# Patient Record
Sex: Female | Born: 1970
Health system: Southern US, Community
[De-identification: ages and names within clinical notes are randomized; demographics above are authoritative.]

## PROBLEM LIST (undated history)

## (undated) DIAGNOSIS — F419 Anxiety disorder, unspecified: Secondary | ICD-10-CM

## (undated) DIAGNOSIS — N62 Hypertrophy of breast: Secondary | ICD-10-CM

## (undated) DIAGNOSIS — E785 Hyperlipidemia, unspecified: Secondary | ICD-10-CM

## (undated) DIAGNOSIS — E039 Hypothyroidism, unspecified: Secondary | ICD-10-CM

## (undated) DIAGNOSIS — N3010 Interstitial cystitis (chronic) without hematuria: Secondary | ICD-10-CM

## (undated) DIAGNOSIS — K219 Gastro-esophageal reflux disease without esophagitis: Secondary | ICD-10-CM

## (undated) HISTORY — DX: Hyperlipidemia, unspecified: E78.5

## (undated) HISTORY — PX: BREAST BIOPSY: SHX20

---

## 2013-08-12 ENCOUNTER — Other Ambulatory Visit: Payer: Self-pay | Admitting: Internal Medicine

## 2013-08-12 MED ORDER — FLUCONAZOLE 150 MG PO TABS: ORAL_TABLET | ORAL | Status: DC

## 2013-08-18 ENCOUNTER — Encounter: Payer: Self-pay | Admitting: Physician Assistant

## 2013-08-18 ENCOUNTER — Ambulatory Visit (INDEPENDENT_AMBULATORY_CARE_PROVIDER_SITE_OTHER): Payer: BC Managed Care – PPO | Admitting: Physician Assistant

## 2013-08-18 VITALS — BP 122/80 | HR 72 | Temp 99.0°F | Resp 16 | Ht 68.0 in | Wt 170.0 lb

## 2013-08-18 DIAGNOSIS — R062 Wheezing: Secondary | ICD-10-CM

## 2013-08-18 DIAGNOSIS — E039 Hypothyroidism, unspecified: Secondary | ICD-10-CM

## 2013-08-18 DIAGNOSIS — R002 Palpitations: Secondary | ICD-10-CM

## 2013-08-18 LAB — CBC WITH DIFFERENTIAL/PLATELET
BASOS ABS: 0.1 10*3/uL (ref 0.0–0.1)
Basophils Relative: 1 % (ref 0–1)
Eosinophils Absolute: 0.2 10*3/uL (ref 0.0–0.7)
Eosinophils Relative: 3 % (ref 0–5)
HCT: 40.8 % (ref 36.0–46.0)
Hemoglobin: 14.1 g/dL (ref 12.0–15.0)
LYMPHS ABS: 2 10*3/uL (ref 0.7–4.0)
LYMPHS PCT: 32 % (ref 12–46)
MCH: 29.7 pg (ref 26.0–34.0)
MCHC: 34.6 g/dL (ref 30.0–36.0)
MCV: 86.1 fL (ref 78.0–100.0)
Monocytes Absolute: 0.5 10*3/uL (ref 0.1–1.0)
Monocytes Relative: 8 % (ref 3–12)
NEUTROS ABS: 3.5 10*3/uL (ref 1.7–7.7)
NEUTROS PCT: 56 % (ref 43–77)
Platelets: 321 10*3/uL (ref 150–400)
RBC: 4.74 MIL/uL (ref 3.87–5.11)
RDW: 12.8 % (ref 11.5–15.5)
WBC: 6.3 10*3/uL (ref 4.0–10.5)

## 2013-08-18 MED ORDER — ESOMEPRAZOLE MAGNESIUM 40 MG PO CPDR: 40.0000 mg | DELAYED_RELEASE_CAPSULE | Freq: Every day | ORAL | Status: DC

## 2013-08-18 NOTE — Progress Notes (Signed)
   Subjective:    Patient ID: Shari Payne, female    DOB: 05-12-1971, 43 y.o.   MRN: [id number]  HPI 43 y.o. female with history of anxiety and hypothyroid presents with palpitations and presyncope. She has not had her thyroid checked for over a year. She has walked/jogged with her dog, went yesterday for 2 miles (13 min miles), without any symptoms, no CP, SOB, dizziness.   She has always had some "hard" heart beats with occ skipping that she has contributed to anxiety, however over the past 6 months they have gotten worse. Worse over night and worse around 4 pm. She admits that she has been under increasing stress over work. She only admits that she feels her anxiety has not been controlled, she takes the xanax 1/2 at night. She states she sleeps 10-11 hours. Occ heart burn, takes ibuprofen often due to lower back pain which she attributes to her large breast. She has grooves bilateral shoulders.   Review of Systems  Constitutional: Negative for fever, chills, diaphoresis, activity change, appetite change, fatigue and unexpected weight change.  HENT: Negative.   Respiratory: Negative.   Cardiovascular: Positive for palpitations. Negative for chest pain and leg swelling.  Gastrointestinal: Negative.        GERD  Genitourinary: Negative.   Musculoskeletal: Positive for back pain and myalgias. Negative for arthralgias, gait problem, joint swelling, neck pain and neck stiffness.  Skin: Negative.   Neurological: Positive for syncope (presyncope).  Hematological: Negative.        Objective:   Physical Exam  Constitutional: She is oriented to person, place, and time. She appears well-developed and well-nourished.  HENT:  Head: Normocephalic and atraumatic.  Eyes: Conjunctivae are normal. Pupils are equal, round, and reactive to light.  Neck: Normal range of motion. Neck supple.  Cardiovascular: Normal rate and regular rhythm.  Exam reveals no gallop.   No murmur heard. Loud S2   Pulmonary/Chest: Effort normal. She has wheezes (RLL).  Abdominal: Soft. Bowel sounds are normal. There is tenderness (epigastric). There is no rebound and no guarding.  Musculoskeletal: Normal range of motion.  Lower back pain with changes in position  Lymphadenopathy:    She has no cervical adenopathy.  Neurological: She is alert and oriented to person, place, and time.  Skin: Skin is warm and dry.  Grooves in bilateral shoulders.        Assessment & Plan:  Palpitations- without accompaniments of CP, SOB, dizziness but with one episode of questionable presyncope- ? pericordial knock/ wheezing right lower low- get CXR EKG is normal, no ST changes Nexium samples? esophagel spasm- take samples on empty stomach, decrease NSAIDS Check labs- electrolytes/TSH After labs wants to try new Anxiety med- will switch to Brintellix.   Lower back pain- given stretches in the office, try tylenol rather than aleve, and prevention given. We may consider injections/xrays next visit. If conservative treatment fails,  with her current BMI and large breast with shoulder grooves, back pain, and yeast infections we may consider a breast reduction.

## 2013-08-18 NOTE — Patient Instructions (Signed)
Back Injury Prevention Back injuries can be extremely painful and difficult to heal. After having one back injury, you are much more likely to experience another later on. It is important to learn how to avoid injuring or re-injuring your back. The following tips can help you to prevent a back injury. PHYSICAL FITNESS Exercise regularly and try to develop good tone in your abdominal muscles. Your abdominal muscles provide a lot of the support needed by your back. Do aerobic exercises (walking, jogging, biking, swimming) regularly. Do exercises that increase balance and strength (tai chi, yoga) regularly. This can decrease your risk of falling and injuring your back. Stretch before and after exercising. Maintain a healthy weight. The more you weigh, the more stress is placed on your back. For every pound of weight, 10 times that amount of pressure is placed on the back. DIET Talk to your caregiver about how much calcium and vitamin D you need per day. These nutrients help to prevent weakening of the bones (osteoporosis). Osteoporosis can cause broken (fractured) bones that lead to back pain. Include good sources of calcium in your diet, such as dairy products, green, leafy vegetables, and products with calcium added (fortified). Include good sources of vitamin D in your diet, such as milk and foods that are fortified with vitamin D. Consider taking a nutritional supplement or a multivitamin if needed. Stop smoking if you smoke. POSTURE Sit and stand up straight. Avoid leaning forward when you sit or hunching over when you stand. Choose chairs with good low back (lumbar) support. If you work at a desk, sit close to your work so you do not need to lean over. Keep your chin tucked in. Keep your neck drawn back and elbows bent at a right angle. Your arms should look like the letter "L." Sit high and close to the steering wheel when you drive. Add a lumbar support to your car seat if needed. Avoid  sitting or standing in one position for too long. Take breaks to get up, stretch, and walk around at least once every hour. Take breaks if you are driving for long periods of time. Sleep on your side with your knees slightly bent, or sleep on your back with a pillow under your knees. Do not sleep on your stomach. LIFTING, TWISTING, AND REACHING Avoid heavy lifting, especially repetitive lifting. If you must do heavy lifting: Stretch before lifting. Work slowly. Rest between lifts. Use carts and dollies to move objects when possible. Make several small trips instead of carrying 1 heavy load. Ask for help when you need it. Ask for help when moving big, awkward objects. Follow these steps when lifting: Stand with your feet shoulder-width apart. Get as close to the object as you can. Do not try to pick up heavy objects that are far from your body. Use handles or lifting straps if they are available. Bend at your knees. Squat down, but keep your heels off the floor. Keep your shoulders pulled back, your chin tucked in, and your back straight. Lift the object slowly, tightening the muscles in your legs, abdomen, and buttocks. Keep the object as close to the center of your body as possible. When you put a load down, use these same guidelines in reverse. Do not: Lift the object above your waist. Twist at the waist while lifting or carrying a load. Move your feet if you need to turn, not your waist. Bend over without bending at your knees. Avoid reaching over your head, across a  table, or for an object on a high surface. OTHER TIPS Avoid wet floors and keep sidewalks clear of ice to prevent falls. Do not sleep on a mattress that is too soft or too hard. Keep items that are used frequently within easy reach. Put heavier objects on shelves at waist level and lighter objects on lower or higher shelves. Find ways to decrease your stress, such as exercise, massage, or relaxation techniques. Stress can  build up in your muscles. Tense muscles are more vulnerable to injury. Seek treatment for depression or anxiety if needed. These conditions can increase your risk of developing back pain. SEEK MEDICAL CARE IF: You injure your back. You have questions about diet, exercise, or other ways to prevent back injuries. MAKE SURE YOU: Understand these instructions. Will watch your condition. Will get help right away if you are not doing well or get worse. Document Released: 06/12/2004 Document Revised: 07/28/2011 Document Reviewed: 06/16/2011 [facility] Patient Information 2014 [place], [place]. Palpitations  A palpitation is the feeling that your heartbeat is irregular or is faster than normal. It may feel like your heart is fluttering or skipping a beat. Palpitations are usually not a serious problem. However, in some cases, you may need further medical evaluation. CAUSES  Palpitations can be caused by:  Smoking.  Caffeine or other stimulants, such as diet pills or energy drinks.  Alcohol.  Stress and anxiety.  Strenuous physical activity.  Fatigue.  Certain medicines.  Heart disease, especially if you have a history of arrhythmias. This includes atrial fibrillation, atrial flutter, or supraventricular tachycardia.  An improperly working pacemaker or defibrillator. DIAGNOSIS  To find the cause of your palpitations, your caregiver will take your history and perform a physical exam. Tests may also be done, including:  Electrocardiography (ECG). This test records the heart's electrical activity.  Cardiac monitoring. This allows your caregiver to monitor your heart rate and rhythm in real time.  Holter monitor. This is a portable device that records your heartbeat and can help diagnose heart arrhythmias. It allows your caregiver to track your heart activity for several days, if needed.  Stress tests by exercise or by giving medicine that makes the heart beat faster. TREATMENT  Treatment  of palpitations depends on the cause of your symptoms and can vary greatly. Most cases of palpitations do not require any treatment other than time, relaxation, and monitoring your symptoms. Other causes, such as atrial fibrillation, atrial flutter, or supraventricular tachycardia, usually require further treatment. HOME CARE INSTRUCTIONS   Avoid:  Caffeinated coffee, tea, soft drinks, diet pills, and energy drinks.  Chocolate.  Alcohol.  Stop smoking if you smoke.  Reduce your stress and anxiety. Things that can help you relax include:  A method that measures bodily functions so you can learn to control them (biofeedback).  Yoga.  Meditation.  Physical activity such as swimming, jogging, or walking.  Get plenty of rest and sleep. SEEK MEDICAL CARE IF:   You continue to have a fast or irregular heartbeat beyond 24 hours.  Your palpitations occur more often. SEEK IMMEDIATE MEDICAL CARE IF:  You develop chest pain or shortness of breath.  You have a severe headache.  You feel dizzy, or you faint. MAKE SURE YOU:  Understand these instructions.  Will watch your condition.  Will get help right away if you are not doing well or get worse. Document Released: 05/02/2000 Document Revised: 08/30/2012 Document Reviewed: 07/04/2011 [facility] Patient Information 2014 [place].

## 2013-08-19 ENCOUNTER — Ambulatory Visit (HOSPITAL_COMMUNITY)
Admission: RE | Admit: 2013-08-19 | Discharge: 2013-08-19 | Disposition: A | Discharge status: Home / Self Care | Payer: BC Managed Care – PPO | Source: Ambulatory Visit | Attending: Physician Assistant | Admitting: Physician Assistant

## 2013-08-19 DIAGNOSIS — R002 Palpitations: Secondary | ICD-10-CM | POA: Insufficient documentation

## 2013-08-19 DIAGNOSIS — Z87891 Personal history of nicotine dependence: Secondary | ICD-10-CM | POA: Insufficient documentation

## 2013-08-19 DIAGNOSIS — R062 Wheezing: Secondary | ICD-10-CM

## 2013-08-19 DIAGNOSIS — I10 Essential (primary) hypertension: Secondary | ICD-10-CM | POA: Insufficient documentation

## 2013-08-19 LAB — BASIC METABOLIC PANEL WITH GFR
BUN: 15 mg/dL (ref 6–23)
CO2: 32 meq/L (ref 19–32)
CREATININE: 0.87 mg/dL (ref 0.50–1.10)
Calcium: 9.5 mg/dL (ref 8.4–10.5)
Chloride: 97 mEq/L (ref 96–112)
GFR, EST NON AFRICAN AMERICAN: 84 mL/min
GFR, Est African American: >89 mL/min
Glucose, Bld: 79 mg/dL (ref 70–99)
POTASSIUM: 4.9 meq/L (ref 3.5–5.3)
Sodium: 136 mEq/L (ref 135–145)

## 2013-08-19 LAB — HEPATIC FUNCTION PANEL
ALBUMIN: 4.5 g/dL (ref 3.5–5.2)
ALT: 13 U/L (ref 0–35)
AST: 16 U/L (ref 0–37)
Alkaline Phosphatase: 49 U/L (ref 39–117)
BILIRUBIN TOTAL: 0.6 mg/dL (ref 0.2–1.2)
Bilirubin, Direct: 0.1 mg/dL (ref 0.0–0.3)
Indirect Bilirubin: 0.5 mg/dL (ref 0.2–1.2)
Total Protein: 7.8 g/dL (ref 6.0–8.3)

## 2013-08-19 LAB — TSH: TSH: 1.708 u[IU]/mL (ref 0.350–4.500)

## 2013-08-19 LAB — MAGNESIUM: Magnesium: 2.1 mg/dL (ref 1.5–2.5)

## 2013-08-28 DIAGNOSIS — E079 Disorder of thyroid, unspecified: Secondary | ICD-10-CM | POA: Insufficient documentation

## 2013-08-28 DIAGNOSIS — E559 Vitamin D deficiency, unspecified: Secondary | ICD-10-CM | POA: Insufficient documentation

## 2013-09-01 ENCOUNTER — Encounter: Payer: Self-pay | Admitting: Physician Assistant

## 2013-09-01 ENCOUNTER — Ambulatory Visit (INDEPENDENT_AMBULATORY_CARE_PROVIDER_SITE_OTHER): Payer: BC Managed Care – PPO | Admitting: Physician Assistant

## 2013-09-01 VITALS — BP 110/68 | HR 80 | Temp 97.9°F | Resp 16 | Wt 172.0 lb

## 2013-09-01 DIAGNOSIS — M549 Dorsalgia, unspecified: Secondary | ICD-10-CM

## 2013-09-01 DIAGNOSIS — M542 Cervicalgia: Secondary | ICD-10-CM

## 2013-09-01 DIAGNOSIS — N62 Hypertrophy of breast: Secondary | ICD-10-CM

## 2013-09-01 MED ORDER — VORTIOXETINE HBR 20 MG PO TABS: 20.0000 mg | ORAL_TABLET | Freq: Every day | ORAL | Status: DC

## 2013-09-01 NOTE — Patient Instructions (Signed)
Lumbosacral Strain Lumbosacral strain is a strain of any of the parts that make up your lumbosacral vertebrae. Your lumbosacral vertebrae are the bones that make up the lower third of your backbone. Your lumbosacral vertebrae are held together by muscles and tough, fibrous tissue (ligaments).  CAUSES  A sudden blow to your back can cause lumbosacral strain. Also, anything that causes an excessive stretch of the muscles in the low back can cause this strain. This is typically seen when people exert themselves strenuously, fall, lift heavy objects, bend, or crouch repeatedly. RISK FACTORS  Physically demanding work.  Participation in pushing or pulling sports or sports that require sudden twist of the back (tennis, golf, baseball).  Weight lifting.  Excessive lower back curvature.  Forward-tilted pelvis.  Weak back or abdominal muscles or both.  Tight hamstrings. SIGNS AND SYMPTOMS  Lumbosacral strain may cause pain in the area of your injury or pain that moves (radiates) down your leg.  DIAGNOSIS Your health care provider can often diagnose lumbosacral strain through a physical exam. In some cases, you may need tests such as X-ray exams.  TREATMENT  Treatment for your lower back injury depends on many factors that your clinician will have to evaluate. However, most treatment will include the use of anti-inflammatory medicines. HOME CARE INSTRUCTIONS   Avoid hard physical activities (tennis, racquetball, waterskiing) if you are not in proper physical condition for it. This may aggravate or create problems.  If you have a back problem, avoid sports requiring sudden body movements. Swimming and walking are generally safer activities.  Maintain good posture.  Maintain a healthy weight.  For acute conditions, you may put ice on the injured area.  Put ice in a plastic bag.  Place a towel between your skin and the bag.  Leave the ice on for 20 minutes, 2 3 times a day.  When the  low back starts healing, stretching and strengthening exercises may be recommended. SEEK MEDICAL CARE IF:  Your back pain is getting worse.  You experience severe back pain not relieved with medicines. SEEK IMMEDIATE MEDICAL CARE IF:   You have numbness, tingling, weakness, or problems with the use of your arms or legs.  There is a change in bowel or bladder control.  You have increasing pain in any area of the body, including your belly (abdomen).  You notice shortness of breath, dizziness, or feel faint.  You feel sick to your stomach (nauseous), are throwing up (vomiting), or become sweaty.  You notice discoloration of your toes or legs, or your feet get very cold. MAKE SURE YOU:   Understand these instructions.  Will watch your condition.  Will get help right away if you are not doing well or get worse. Document Released: 02/12/2005 Document Revised: 02/23/2013 Document Reviewed: 12/22/2012 [employer] Patient Information 2014 [employer].  

## 2013-09-01 NOTE — Progress Notes (Signed)
HPI 43 y.o.female presents for 2 week follow up for palpitations. She had a normal CXR, EKG, and labs were normal. She was given Nexium for possible GERD/esophageal spam and her lexapro was switched to brintellix 10mg  daily. She states that her palpitations are gone and that her anxiety has improved significantly. She no longer has chest tightness, she is able to stay a wake for longer and feels she has more energy. She continues to have lower/mid back pain and occasional cervical neck pain, without radiation down her legs. Primarily muscular, she has switched from ibuprofen to tylenol for the pain, done stretches, rest, ice heat and continues to have discomfort.   Past Medical History  Diagnosis Date  . Anemia   . Thyroid disease   . Vitamin D deficiency      Allergies  Allergen Reactions  . Amoxicillin Rash    Zpack okay per pt     Current Outpatient Prescriptions on File Prior to Visit  Medication Sig Dispense Refill  . ALPRAZolam (XANAX) 0.5 MG tablet Take 0.5 mg by mouth at bedtime as needed for anxiety.      Marland Kitchen esomeprazole (NEXIUM) 40 MG capsule Take 1 capsule (40 mg total) by mouth daily.  30 capsule  1  . levothyroxine (SYNTHROID, LEVOTHROID) 125 MCG tablet Take 125 mcg by mouth daily before breakfast.       No current facility-administered medications on file prior to visit.    ROS: all negative expect above.   Physical: Filed Weights   09/01/13 1549  Weight: 172 lb (78.019 kg)   BP 110/68  Pulse 80  Temp(Src) 97.9 F (36.6 C)  Resp 16  Wt 172 lb (78.019 kg)  LMP 08/09/2013 General Appearance: Well nourished, in no apparent distress. Eyes: PERRLA, EOMs. Sinuses: No Frontal/maxillary tenderness ENT/Mouth: Ext aud canals clear, normal light reflex with TMs without erythema, bulging. Post pharynx without erythema, swelling, exudate.  Respiratory: CTAB Cardio: RRR, no murmurs, rubs or gallops. Peripheral pulses brisk and equal bilaterally, without edema. No aortic or  femoral bruits. Abdomen: Soft, with bowl sounds. Nontender, no guarding, rebound. Lymphatics: Non tender without lymphadenopathy.  Musculoskeletal: Full ROM all peripheral extremities, 5/5 strength, and normal gait. Back pain with changes in position.  Skin: Warm, dry without rashes, lesions, ecchymosis. Grooves bilateral shoulders Neuro: Cranial nerves intact, reflexes equal bilaterally. Normal muscle tone, no cerebellar symptoms. Sensation intact.  Pysch: Awake and oriented X 3, normal affect, Insight and Judgment appropriate.   Assessment and Plan: Palpitations- resolved GERD- likely secondary to NSAIDS for back pain- taper off nexium, do an H2 blocker for 2 weeks and then can get off of it. If she continues to have GERD after that will continue PPI.  Anxiety- continue Brintellix 20mg  QD.  Neck and Back pain- continue tylenol for now, and will refer to PT, if this fails to help will refer to  Surgeon for possible breast reduction.

## 2013-09-21 ENCOUNTER — Ambulatory Visit: Payer: Self-pay | Admitting: Internal Medicine

## 2013-09-27 ENCOUNTER — Other Ambulatory Visit: Payer: Self-pay

## 2013-09-27 ENCOUNTER — Telehealth: Payer: Self-pay

## 2013-09-27 MED ORDER — ESOMEPRAZOLE MAGNESIUM 40 MG PO CPDR: 40.0000 mg | DELAYED_RELEASE_CAPSULE | Freq: Every day | ORAL | Status: DC

## 2013-09-27 NOTE — Telephone Encounter (Signed)
Rx requested by patient , Vicie Mutters, PA approved

## 2013-11-10 ENCOUNTER — Encounter: Payer: Self-pay | Admitting: Physician Assistant

## 2013-11-10 ENCOUNTER — Ambulatory Visit (INDEPENDENT_AMBULATORY_CARE_PROVIDER_SITE_OTHER): Payer: BC Managed Care – PPO | Admitting: Physician Assistant

## 2013-11-10 VITALS — BP 110/78 | HR 76 | Temp 98.1°F | Resp 16 | Wt 175.0 lb

## 2013-11-10 DIAGNOSIS — R300 Dysuria: Secondary | ICD-10-CM

## 2013-11-10 DIAGNOSIS — N898 Other specified noninflammatory disorders of vagina: Secondary | ICD-10-CM

## 2013-11-10 DIAGNOSIS — R1013 Epigastric pain: Secondary | ICD-10-CM

## 2013-11-10 DIAGNOSIS — K219 Gastro-esophageal reflux disease without esophagitis: Secondary | ICD-10-CM

## 2013-11-10 LAB — CBC WITH DIFFERENTIAL/PLATELET
Basophils Absolute: 0.1 10*3/uL (ref 0.0–0.1)
Basophils Relative: 1 % (ref 0–1)
EOS PCT: 4 % (ref 0–5)
Eosinophils Absolute: 0.3 10*3/uL (ref 0.0–0.7)
HCT: 40.6 % (ref 36.0–46.0)
Hemoglobin: 14.1 g/dL (ref 12.0–15.0)
LYMPHS ABS: 1.8 10*3/uL (ref 0.7–4.0)
Lymphocytes Relative: 28 % (ref 12–46)
MCH: 30 pg (ref 26.0–34.0)
MCHC: 34.7 g/dL (ref 30.0–36.0)
MCV: 86.4 fL (ref 78.0–100.0)
MONO ABS: 0.7 10*3/uL (ref 0.1–1.0)
MONOS PCT: 10 % (ref 3–12)
Neutro Abs: 3.7 10*3/uL (ref 1.7–7.7)
Neutrophils Relative %: 57 % (ref 43–77)
Platelets: 309 10*3/uL (ref 150–400)
RBC: 4.7 MIL/uL (ref 3.87–5.11)
RDW: 12.6 % (ref 11.5–15.5)
WBC: 6.5 10*3/uL (ref 4.0–10.5)

## 2013-11-10 MED ORDER — DEXLANSOPRAZOLE 60 MG PO CPDR: 60.0000 mg | DELAYED_RELEASE_CAPSULE | Freq: Every day | ORAL | Status: DC

## 2013-11-10 NOTE — Patient Instructions (Signed)

## 2013-11-10 NOTE — Progress Notes (Signed)
   Subjective:    Patient ID: Shari Payne, female    DOB: 1970-06-04, 43 y.o.   MRN: [id number]  HPI 43 y.o. female with history of GERD treated for 2 weeks on nexium did well. She got off the nexium and started on zantac which did not help. She started back on the nexium which has helped her GERD but she states that it is "making her bladder burning."   She has neck and back pain, has done NSAIDS, tyelnol, and a month of physical therapy without any help.   Review of Systems  Constitutional: Negative.   HENT: Negative.   Respiratory: Negative.   Cardiovascular: Negative.   Gastrointestinal: Positive for abdominal pain. Negative for nausea, vomiting, diarrhea, constipation, blood in stool, abdominal distention, anal bleeding and rectal pain.  Genitourinary: Positive for dysuria and vaginal discharge. Negative for urgency, frequency, hematuria, flank pain, decreased urine volume, vaginal bleeding, enuresis, difficulty urinating, genital sores, vaginal pain, menstrual problem, pelvic pain and dyspareunia.  Musculoskeletal: Positive for back pain. Negative for arthralgias, gait problem, joint swelling, myalgias, neck pain and neck stiffness.       Objective:   Physical Exam  Constitutional: She is oriented to person, place, and time. She appears well-developed and well-nourished.  HENT:  Head: Normocephalic and atraumatic.  Right Ear: External ear normal.  Left Ear: External ear normal.  Mouth/Throat: Oropharynx is clear and moist.  Eyes: Conjunctivae and EOM are normal. Pupils are equal, round, and reactive to light.  Neck: Normal range of motion. Neck supple. No thyromegaly present.  Cardiovascular: Normal rate, regular rhythm and normal heart sounds.  Exam reveals no gallop and no friction rub.   No murmur heard. Pulmonary/Chest: Effort normal and breath sounds normal. No respiratory distress. She has no wheezes.  Abdominal: Soft. Normal appearance and bowel sounds are normal. She  exhibits no distension and no mass. There is tenderness in the epigastric area. There is no rigidity, no rebound, no guarding, no CVA tenderness, no tenderness at McBurney's point and negative Murphy's sign.  Genitourinary: There is no rash, tenderness, lesion or injury on the right labia. There is no rash, tenderness, lesion or injury on the left labia. Cervix exhibits discharge (thin white discharge mild odor). Cervix exhibits no motion tenderness and no friability. No erythema, tenderness or bleeding around the vagina. No foreign body around the vagina. No signs of injury around the vagina. No vaginal discharge found.  Musculoskeletal: Normal range of motion.  Lower back pain with changes in position  Lymphadenopathy:    She has no cervical adenopathy.  Neurological: She is alert and oriented to person, place, and time. She displays normal reflexes. No cranial nerve deficit. Coordination normal.  Skin: Skin is warm and dry. No rash noted.  Grooves in bilateral shoulders.    Psychiatric: She has a normal mood and affect.       Assessment & Plan:  GERD- dexilant sample and RX given since Nexium not helping, check Hpylori, given information on triggers/foods to avoid.  Dyuria- check UA C&S rule out UTI, check for BV/yeast, if all negative possible ICS Back pain/neck pain secondary to large breast after failing conservative therapy, will refer to Plastic Surgery for possible reduction.   OVER 30 minutes of exam, counseling, chart review, referral performed

## 2013-11-11 LAB — WET PREP BY MOLECULAR PROBE
Candida species: NEGATIVE
Gardnerella vaginalis: NEGATIVE
Trichomonas vaginosis: NEGATIVE

## 2013-11-11 LAB — URINALYSIS, ROUTINE W REFLEX MICROSCOPIC
BILIRUBIN URINE: NEGATIVE
Glucose, UA: NEGATIVE mg/dL
Hgb urine dipstick: NEGATIVE
KETONES UR: NEGATIVE mg/dL
Leukocytes, UA: NEGATIVE
Nitrite: NEGATIVE
PH: 7.5 (ref 5.0–8.0)
Protein, ur: NEGATIVE mg/dL
Specific Gravity, Urine: 1.016 (ref 1.005–1.030)
Urobilinogen, UA: 1 mg/dL (ref 0.0–1.0)

## 2013-11-11 LAB — HELICOBACTER PYLORI ABS-IGG+IGA, BLD
H Pylori IgG: 0.5 {ISR}
HELICOBACTER PYLORI AB, IGA: 3.5 U/mL (ref <9.0)

## 2013-11-11 LAB — COMPREHENSIVE METABOLIC PANEL
ALT: 24 U/L (ref 0–35)
AST: 20 U/L (ref 0–37)
Albumin: 4.4 g/dL (ref 3.5–5.2)
Alkaline Phosphatase: 51 U/L (ref 39–117)
BUN: 18 mg/dL (ref 6–23)
CO2: 30 mEq/L (ref 19–32)
Calcium: 9.5 mg/dL (ref 8.4–10.5)
Chloride: 103 mEq/L (ref 96–112)
Creat: 0.86 mg/dL (ref 0.50–1.10)
GLUCOSE: 84 mg/dL (ref 70–99)
POTASSIUM: 5 meq/L (ref 3.5–5.3)
Sodium: 140 mEq/L (ref 135–145)
Total Bilirubin: 0.4 mg/dL (ref 0.2–1.2)
Total Protein: 7.4 g/dL (ref 6.0–8.3)

## 2013-11-11 LAB — URINE CULTURE
Colony Count: NO GROWTH
Organism ID, Bacteria: NO GROWTH

## 2013-11-15 NOTE — Addendum Note (Signed)
Addended by: Vicie Mutters R on: 11/15/2013 03:34 PM   Modules accepted: Orders

## 2013-11-24 MED ORDER — AMITRIPTYLINE HCL 10 MG PO TABS: ORAL_TABLET | ORAL | Status: DC

## 2013-11-24 NOTE — Addendum Note (Signed)
Addended by: Vicie Mutters R on: 11/24/2013 08:36 AM   Modules accepted: Orders

## 2013-12-05 ENCOUNTER — Ambulatory Visit: Payer: Self-pay | Admitting: Physician Assistant

## 2013-12-14 ENCOUNTER — Encounter: Payer: Self-pay | Admitting: Physician Assistant

## 2014-01-03 ENCOUNTER — Other Ambulatory Visit: Payer: Self-pay

## 2014-01-03 MED ORDER — AMITRIPTYLINE HCL 10 MG PO TABS: ORAL_TABLET | ORAL | Status: DC

## 2014-02-23 ENCOUNTER — Other Ambulatory Visit: Payer: Self-pay | Admitting: Physician Assistant

## 2014-02-23 MED ORDER — ALPRAZOLAM 0.5 MG PO TABS: 0.5000 mg | ORAL_TABLET | Freq: Three times a day (TID) | ORAL | Status: DC | PRN

## 2014-03-19 DIAGNOSIS — N62 Hypertrophy of breast: Secondary | ICD-10-CM

## 2014-03-19 HISTORY — DX: Hypertrophy of breast: N62

## 2014-04-09 ENCOUNTER — Encounter: Payer: Self-pay | Admitting: *Deleted

## 2014-04-10 ENCOUNTER — Encounter (HOSPITAL_BASED_OUTPATIENT_CLINIC_OR_DEPARTMENT_OTHER): Payer: Self-pay | Admitting: *Deleted

## 2014-04-18 ENCOUNTER — Encounter (HOSPITAL_BASED_OUTPATIENT_CLINIC_OR_DEPARTMENT_OTHER): Payer: Self-pay

## 2014-04-18 ENCOUNTER — Encounter (HOSPITAL_BASED_OUTPATIENT_CLINIC_OR_DEPARTMENT_OTHER)
Admission: RE | Disposition: A | Discharge status: Home / Self Care | Payer: Self-pay | Source: Ambulatory Visit | Attending: Plastic Surgery

## 2014-04-18 ENCOUNTER — Ambulatory Visit (HOSPITAL_BASED_OUTPATIENT_CLINIC_OR_DEPARTMENT_OTHER): Payer: BC Managed Care – PPO | Admitting: Certified Registered"

## 2014-04-18 ENCOUNTER — Ambulatory Visit (HOSPITAL_BASED_OUTPATIENT_CLINIC_OR_DEPARTMENT_OTHER)
Admission: RE | Admit: 2014-04-18 | Discharge: 2014-04-18 | Disposition: A | Discharge status: Home / Self Care | Payer: BC Managed Care – PPO | Source: Ambulatory Visit | Attending: Plastic Surgery | Admitting: Plastic Surgery

## 2014-04-18 DIAGNOSIS — K219 Gastro-esophageal reflux disease without esophagitis: Secondary | ICD-10-CM | POA: Insufficient documentation

## 2014-04-18 DIAGNOSIS — F419 Anxiety disorder, unspecified: Secondary | ICD-10-CM | POA: Insufficient documentation

## 2014-04-18 DIAGNOSIS — N62 Hypertrophy of breast: Secondary | ICD-10-CM | POA: Insufficient documentation

## 2014-04-18 DIAGNOSIS — Z87891 Personal history of nicotine dependence: Secondary | ICD-10-CM | POA: Diagnosis not present

## 2014-04-18 DIAGNOSIS — E039 Hypothyroidism, unspecified: Secondary | ICD-10-CM | POA: Insufficient documentation

## 2014-04-18 HISTORY — DX: Gastro-esophageal reflux disease without esophagitis: K21.9

## 2014-04-18 HISTORY — DX: Interstitial cystitis (chronic) without hematuria: N30.10

## 2014-04-18 HISTORY — DX: Anxiety disorder, unspecified: F41.9

## 2014-04-18 HISTORY — DX: Hypertrophy of breast: N62

## 2014-04-18 HISTORY — DX: Hypothyroidism, unspecified: E03.9

## 2014-04-18 HISTORY — PX: BREAST REDUCTION SURGERY: SHX8

## 2014-04-18 LAB — POCT HEMOGLOBIN-HEMACUE: Hemoglobin: 13.6 g/dL (ref 12.0–15.0)

## 2014-04-18 SURGERY — MAMMOPLASTY, REDUCTION
Anesthesia: General | Site: Breast | Laterality: Bilateral

## 2014-04-18 MED ORDER — CEFAZOLIN SODIUM-DEXTROSE 2-3 GM-% IV SOLR
INTRAVENOUS | Status: AC
  Filled 2014-04-18: qty 50

## 2014-04-18 MED ORDER — MIDAZOLAM HCL 5 MG/5ML IJ SOLN
INTRAMUSCULAR | Status: DC | PRN
  Administered 2014-04-18: 2 mg via INTRAVENOUS

## 2014-04-18 MED ORDER — MIDAZOLAM HCL 2 MG/2ML IJ SOLN: 1.0000 mg | INTRAMUSCULAR | Status: DC | PRN

## 2014-04-18 MED ORDER — BUPIVACAINE LIPOSOME 1.3 % IJ SUSP
INTRAMUSCULAR | Status: AC
  Filled 2014-04-18: qty 20

## 2014-04-18 MED ORDER — HYDROMORPHONE HCL 1 MG/ML IJ SOLN
0.2500 mg | INTRAMUSCULAR | Status: DC | PRN
  Administered 2014-04-18: 0.5 mg via INTRAVENOUS

## 2014-04-18 MED ORDER — LACTATED RINGERS IV SOLN
INTRAVENOUS | Status: DC
  Administered 2014-04-18 (×3): via INTRAVENOUS

## 2014-04-18 MED ORDER — OXYCODONE HCL 5 MG PO TABS
5.0000 mg | ORAL_TABLET | Freq: Once | ORAL | Status: AC | PRN
  Administered 2014-04-18: 5 mg via ORAL

## 2014-04-18 MED ORDER — DEXAMETHASONE SODIUM PHOSPHATE 4 MG/ML IJ SOLN
INTRAMUSCULAR | Status: DC | PRN
  Administered 2014-04-18: 10 mg via INTRAVENOUS

## 2014-04-18 MED ORDER — LIDOCAINE HCL (CARDIAC) 20 MG/ML IV SOLN
INTRAVENOUS | Status: DC | PRN
  Administered 2014-04-18: 60 mg via INTRAVENOUS

## 2014-04-18 MED ORDER — BACITRACIN ZINC 500 UNIT/GM EX OINT
TOPICAL_OINTMENT | CUTANEOUS | Status: AC
  Filled 2014-04-18: qty 28.35

## 2014-04-18 MED ORDER — ONDANSETRON HCL 4 MG/2ML IJ SOLN: 4.0000 mg | Freq: Once | INTRAMUSCULAR | Status: DC | PRN

## 2014-04-18 MED ORDER — FENTANYL CITRATE 0.05 MG/ML IJ SOLN
INTRAMUSCULAR | Status: AC
  Filled 2014-04-18: qty 12

## 2014-04-18 MED ORDER — SUCCINYLCHOLINE CHLORIDE 20 MG/ML IJ SOLN
INTRAMUSCULAR | Status: AC
  Filled 2014-04-18: qty 1

## 2014-04-18 MED ORDER — FENTANYL CITRATE 0.05 MG/ML IJ SOLN: 50.0000 ug | INTRAMUSCULAR | Status: DC | PRN

## 2014-04-18 MED ORDER — SUCCINYLCHOLINE CHLORIDE 20 MG/ML IJ SOLN
INTRAMUSCULAR | Status: DC | PRN
  Administered 2014-04-18: 100 mg via INTRAVENOUS

## 2014-04-18 MED ORDER — CEFAZOLIN SODIUM 1-5 GM-% IV SOLN
1.0000 g | Freq: Once | INTRAVENOUS | Status: AC
  Administered 2014-04-18: 2 g via INTRAVENOUS

## 2014-04-18 MED ORDER — SODIUM CHLORIDE 0.9 % IR SOLN
Status: DC | PRN
  Administered 2014-04-18: 2000 mL

## 2014-04-18 MED ORDER — OXYCODONE HCL 5 MG PO TABS
ORAL_TABLET | ORAL | Status: AC
  Filled 2014-04-18: qty 1

## 2014-04-18 MED ORDER — PROPOFOL 10 MG/ML IV BOLUS
INTRAVENOUS | Status: AC
  Filled 2014-04-18: qty 20

## 2014-04-18 MED ORDER — BUPIVACAINE LIPOSOME 1.3 % IJ SUSP
INTRAMUSCULAR | Status: DC | PRN
  Administered 2014-04-18: 20 mL

## 2014-04-18 MED ORDER — FENTANYL CITRATE 0.05 MG/ML IJ SOLN
INTRAMUSCULAR | Status: DC | PRN
  Administered 2014-04-18: 50 ug via INTRAVENOUS
  Administered 2014-04-18 (×3): 100 ug via INTRAVENOUS

## 2014-04-18 MED ORDER — HYDROMORPHONE HCL 1 MG/ML IJ SOLN
INTRAMUSCULAR | Status: AC
  Filled 2014-04-18: qty 1

## 2014-04-18 MED ORDER — LIDOCAINE-EPINEPHRINE 1 %-1:100000 IJ SOLN
INTRAMUSCULAR | Status: AC
  Filled 2014-04-18: qty 2

## 2014-04-18 MED ORDER — PROPOFOL 10 MG/ML IV BOLUS
INTRAVENOUS | Status: DC | PRN
  Administered 2014-04-18: 200 mg via INTRAVENOUS

## 2014-04-18 MED ORDER — SCOPOLAMINE 1 MG/3DAYS TD PT72
1.0000 | MEDICATED_PATCH | TRANSDERMAL | Status: DC
  Administered 2014-04-18: 1.5 mg via TRANSDERMAL

## 2014-04-18 MED ORDER — MIDAZOLAM HCL 2 MG/2ML IJ SOLN
INTRAMUSCULAR | Status: AC
  Filled 2014-04-18: qty 2

## 2014-04-18 MED ORDER — OXYCODONE HCL 5 MG/5ML PO SOLN: 5.0000 mg | Freq: Once | ORAL | Status: AC | PRN

## 2014-04-18 MED ORDER — MIDAZOLAM HCL 2 MG/ML PO SYRP: 12.0000 mg | ORAL_SOLUTION | Freq: Once | ORAL | Status: DC | PRN

## 2014-04-18 MED ORDER — SCOPOLAMINE 1 MG/3DAYS TD PT72
MEDICATED_PATCH | TRANSDERMAL | Status: AC
  Filled 2014-04-18: qty 1

## 2014-04-18 MED ORDER — LIDOCAINE-EPINEPHRINE 1 %-1:100000 IJ SOLN
INTRAMUSCULAR | Status: DC | PRN
  Administered 2014-04-18: 40 mL

## 2014-04-18 SURGICAL SUPPLY — 64 items
BAG DECANTER FOR FLEXI CONT (MISCELLANEOUS) ×3 IMPLANT
BENZOIN TINCTURE PRP APPL 2/3 (GAUZE/BANDAGES/DRESSINGS) ×6 IMPLANT
BLADE KNIFE PERSONA 10 (BLADE) ×12 IMPLANT
BLADE KNIFE PERSONA 15 (BLADE) ×9 IMPLANT
BNDG GAUZE ELAST 4 BULKY (GAUZE/BANDAGES/DRESSINGS) ×6 IMPLANT
CANISTER SUCT 1200ML W/VALVE (MISCELLANEOUS) ×6 IMPLANT
CAP BOUFFANT 24 BLUE NURSES (PROTECTIVE WEAR) ×3 IMPLANT
CLOSURE WOUND 1/2 X4 (GAUZE/BANDAGES/DRESSINGS) ×4
COVER BACK TABLE 60X90IN (DRAPES) ×3 IMPLANT
COVER MAYO STAND STRL (DRAPES) ×3 IMPLANT
DECANTER SPIKE VIAL GLASS SM (MISCELLANEOUS) ×3 IMPLANT
DRAIN CHANNEL 10F 3/8 F FF (DRAIN) ×6 IMPLANT
DRAPE LAPAROSCOPIC ABDOMINAL (DRAPES) ×3 IMPLANT
DRAPE U-SHAPE 76X120 STRL (DRAPES) IMPLANT
DRSG EMULSION OIL 3X3 NADH (GAUZE/BANDAGES/DRESSINGS) ×6 IMPLANT
DRSG PAD ABDOMINAL 8X10 ST (GAUZE/BANDAGES/DRESSINGS) ×6 IMPLANT
ELECT REM PT RETURN 9FT ADLT (ELECTROSURGICAL) ×3
ELECTRODE REM PT RTRN 9FT ADLT (ELECTROSURGICAL) ×1 IMPLANT
EVACUATOR SILICONE 100CC (DRAIN) ×6 IMPLANT
FILTER 7/8 IN (FILTER) ×3 IMPLANT
GAUZE SPONGE 4X4 12PLY STRL (GAUZE/BANDAGES/DRESSINGS) ×6 IMPLANT
GLOVE BIO SURGEON STRL SZ7 (GLOVE) ×3 IMPLANT
GLOVE BIOGEL PI IND STRL 7.0 (GLOVE) ×2 IMPLANT
GLOVE BIOGEL PI IND STRL 8 (GLOVE) ×1 IMPLANT
GLOVE BIOGEL PI INDICATOR 7.0 (GLOVE) ×4
GLOVE BIOGEL PI INDICATOR 8 (GLOVE) ×2
GLOVE ECLIPSE 6.5 STRL STRAW (GLOVE) ×9 IMPLANT
GLOVE ECLIPSE 7.5 STRL STRAW (GLOVE) ×3 IMPLANT
GOWN STRL REUS W/ TWL LRG LVL3 (GOWN DISPOSABLE) ×3 IMPLANT
GOWN STRL REUS W/ TWL XL LVL3 (GOWN DISPOSABLE) ×1 IMPLANT
GOWN STRL REUS W/TWL LRG LVL3 (GOWN DISPOSABLE) ×6
GOWN STRL REUS W/TWL XL LVL3 (GOWN DISPOSABLE) ×2
IV NS 250ML (IV SOLUTION) ×2
IV NS 250ML BAXH (IV SOLUTION) ×1 IMPLANT
NEEDLE HYPO 25X1 1.5 SAFETY (NEEDLE) ×6 IMPLANT
NEEDLE SPNL 18GX3.5 QUINCKE PK (NEEDLE) ×3 IMPLANT
NS IRRIG 1000ML POUR BTL (IV SOLUTION) ×6 IMPLANT
PACK BASIN DAY SURGERY FS (CUSTOM PROCEDURE TRAY) ×3 IMPLANT
PIN SAFETY STERILE (MISCELLANEOUS) ×3 IMPLANT
SCRUB PCMX 4 OZ (MISCELLANEOUS) ×6 IMPLANT
SLEEVE SCD COMPRESS KNEE MED (MISCELLANEOUS) ×3 IMPLANT
SPECIMEN JAR MEDIUM (MISCELLANEOUS) ×9 IMPLANT
SPECIMEN JAR X LARGE (MISCELLANEOUS) IMPLANT
SPONGE LAP 18X18 X RAY DECT (DISPOSABLE) ×9 IMPLANT
STAPLER VISISTAT 35W (STAPLE) ×6 IMPLANT
STRIP CLOSURE SKIN 1/2X4 (GAUZE/BANDAGES/DRESSINGS) ×8 IMPLANT
SUT ETHILON 3 0 PS 1 (SUTURE) ×3 IMPLANT
SUT MNCRL AB 3-0 PS2 18 (SUTURE) ×15 IMPLANT
SUT MNCRL AB 4-0 PS2 18 (SUTURE) ×6 IMPLANT
SUT MON AB 5-0 PS2 18 (SUTURE) ×6 IMPLANT
SUT PROLENE 2 0 CT2 30 (SUTURE) ×3 IMPLANT
SUT PROLENE 3 0 PS 1 (SUTURE) ×6 IMPLANT
SUT QUILL PDO 2-0 (SUTURE) ×6 IMPLANT
SYR BULB IRRIGATION 50ML (SYRINGE) ×6 IMPLANT
SYR CONTROL 10ML LL (SYRINGE) ×6 IMPLANT
TOWEL OR 17X24 6PK STRL BLUE (TOWEL DISPOSABLE) ×9 IMPLANT
TOWEL OR NON WOVEN STRL DISP B (DISPOSABLE) ×3 IMPLANT
TRAY DSU PREP LF (CUSTOM PROCEDURE TRAY) ×3 IMPLANT
TRAY FOLEY CATH 16FR SILVER (SET/KITS/TRAYS/PACK) ×3 IMPLANT
TUBE CONNECTING 20'X1/4 (TUBING) ×1
TUBE CONNECTING 20X1/4 (TUBING) ×2 IMPLANT
UNDERPAD 30X30 INCONTINENT (UNDERPADS AND DIAPERS) ×6 IMPLANT
VAC PENCILS W/TUBING CLEAR (MISCELLANEOUS) ×3 IMPLANT
YANKAUER SUCT BULB TIP NO VENT (SUCTIONS) ×3 IMPLANT

## 2014-04-18 NOTE — Discharge Instructions (Signed)
1. No lifting greater than 5 lbs with arms for 4 weeks. 2. Empty, strip, record and reactivate JP drains 3 times a day. 3. Percocet 5/325 mg tabs 1-2 tabs po q 4-6 hours prn pain- prescription given in office. 4. Duricef 1 tab po bid- prescription given in office. 5. Sterapred dose pack as directed- prescription given in office. 6. Follow-up appointment Friday in office.      Post Anesthesia Home Care Instructions  Activity: Get plenty of rest for the remainder of the day. A responsible adult should stay with you for 24 hours following the procedure.  For the next 24 hours, DO NOT: -Drive a car -Paediatric nurse -Drink alcoholic beverages -Take any medication unless instructed by your physician -Make any legal decisions or sign important papers.  Meals: Start with liquid foods such as gelatin or soup. Progress to regular foods as tolerated. Avoid greasy, spicy, heavy foods. If nausea and/or vomiting occur, drink only clear liquids until the nausea and/or vomiting subsides. Call your physician if vomiting continues.  Special Instructions/Symptoms: Your throat may feel dry or sore from the anesthesia or the breathing tube placed in your throat during surgery. If this causes discomfort, gargle with warm salt water. The discomfort should disappear within 24 hours.  Call your surgeon if you experience:   1.  Fever over 101.0. 2.  Inability to urinate. 3.  Nausea and/or vomiting. 4.  Extreme swelling or bruising at the surgical site. 5.  Continued bleeding from the incision. 6.  Increased pain, redness or drainage from the incision. 7.  Problems related to your pain medication. 8. Any change in color, movement and/or sensation 9. Any problems and/or concerns   Information for Discharge Teaching: EXPAREL (bupivacaine liposome injectable suspension)   Your surgeon gave you EXPAREL(bupivacaine) in your surgical incision to help control your pain after surgery.   EXPAREL is a  local anesthetic that provides pain relief by numbing the tissue around the surgical site.  EXPAREL is designed to release pain medication over time and can control pain for up to 72 hours.  Depending on how you respond to EXPAREL, you may require less pain medication during your recovery.  Possible side effects:  Temporary loss of sensation or ability to move in the area where bupivacaine was injected.  Nausea, vomiting, constipation  Rarely, numbness and tingling in your mouth or lips, lightheadedness, or anxiety may occur.  Call your doctor right away if you think you may be experiencing any of these sensations, or if you have other questions regarding possible side effects.  Follow all other discharge instructions given to you by your surgeon or nurse. Eat a healthy diet and drink plenty of water or other fluids.  If you return to the hospital for any reason within 96 hours following the administration of EXPAREL, please inform your health care providers.

## 2014-04-18 NOTE — Anesthesia Postprocedure Evaluation (Signed)
Anesthesia Post Note  Patient: Shari Payne  Procedure(s) Performed: Procedure(s) (LRB): MAMMARY REDUCTION  (BREAST) BILATERAL (Bilateral)  Anesthesia type: general  Patient location: PACU  Post pain: Pain level controlled  Post assessment: Patient's Cardiovascular Status Stable  Last Vitals:  Filed Vitals:   04/18/14 1400  BP: 131/96  Pulse: 115  Temp: 36.7 C  Resp: 18    Post vital signs: Reviewed and stable  Level of consciousness: sedated  Complications: No apparent anesthesia complications

## 2014-04-18 NOTE — Transfer of Care (Signed)
Immediate Anesthesia Transfer of Care Note  Patient: Shari Payne  Procedure(s) Performed: Procedure(s): MAMMARY REDUCTION  (BREAST) BILATERAL (Bilateral)  Patient Location: PACU  Anesthesia Type:General  Level of Consciousness: awake, alert , oriented and patient cooperative  Airway & Oxygen Therapy: Patient Spontanous Breathing and Patient connected to face mask oxygen  Post-op Assessment: Report given to PACU RN and Post -op Vital signs reviewed and stable  Post vital signs: Reviewed and stable  Complications: No apparent anesthesia complications

## 2014-04-18 NOTE — Brief Op Note (Signed)
04/18/2014  11:53 AM  PATIENT:  Shari Payne  43 y.o. female  PRE-OPERATIVE DIAGNOSIS:  HYPERTROPHY BILATERAL BREAST  POST-OPERATIVE DIAGNOSIS:  HYPERTROPHY BILATERAL BREAST  PROCEDURE:  Procedure(s): MAMMARY REDUCTION  (BREAST) BILATERAL (Bilateral)  SURGEON:  Surgeon(s) and Role:    * Mary A Contogiannis, MD - Primary  ANESTHESIA:   general  EBL:  Total I/O In: 2000 [I.V.:2000] Out: 575 [Urine:425; Blood:150]  BLOOD ADMINISTERED:none  DRAINS: (29F) Jackson-Pratt drain(s) with closed bulb suction in the Bilateral Breasts   LOCAL MEDICATIONS USED:  1.3% Exparel (total 266 mgs.)  SPECIMEN:  Source of Specimen:  Bilateral breasts  DISPOSITION OF SPECIMEN:  PATHOLOGY  COUNTS:  YES  DICTATION: .Other Dictation: Dictation Number [id number]  PLAN OF CARE: Discharge to home after PACU  PATIENT DISPOSITION:  PACU - hemodynamically stable.   Delay start of Pharmacological VTE agent (>24hrs) due to surgical blood loss or risk of bleeding: not applicable

## 2014-04-18 NOTE — Anesthesia Preprocedure Evaluation (Addendum)
Anesthesia Evaluation  Patient identified by MRN, date of birth, ID band Patient awake    Reviewed: Allergy & Precautions, H&P , NPO status , Patient's Chart, lab work & pertinent test results  Airway Mallampati: I  TM Distance: <3 FB Neck ROM: Full    Dental  (+) Teeth Intact, Dental Advisory Given   Pulmonary former smoker,  breath sounds clear to auscultation        Cardiovascular Rhythm:Regular Rate:Normal     Neuro/Psych Anxiety    GI/Hepatic GERD-  Medicated and Controlled,  Endo/Other  Hypothyroidism   Renal/GU      Musculoskeletal   Abdominal   Peds  Hematology   Anesthesia Other Findings   Reproductive/Obstetrics                            Anesthesia Physical Anesthesia Plan  ASA: I  Anesthesia Plan: General   Post-op Pain Management:    Induction: Intravenous  Airway Management Planned: Oral ETT  Additional Equipment:   Intra-op Plan:   Post-operative Plan: Extubation in OR  Informed Consent: I have reviewed the patients History and Physical, chart, labs and discussed the procedure including the risks, benefits and alternatives for the proposed anesthesia with the patient or authorized representative who has indicated his/her understanding and acceptance.   Dental advisory given  Plan Discussed with: CRNA, Anesthesiologist and Surgeon  Anesthesia Plan Comments:         Anesthesia Quick Evaluation

## 2014-04-18 NOTE — Anesthesia Procedure Notes (Signed)
Procedure Name: Intubation Date/Time: 04/18/2014 7:50 AM Performed by: Mont Jagoda Pre-anesthesia Checklist: Patient identified, Emergency Drugs available, Suction available and Patient being monitored Patient Re-evaluated:Patient Re-evaluated prior to inductionOxygen Delivery Method: Circle System Utilized Preoxygenation: Pre-oxygenation with 100% oxygen Intubation Type: IV induction Ventilation: Mask ventilation without difficulty Laryngoscope Size: Mac Grade View: Grade I Tube type: Oral Tube size: 7.0 mm Number of attempts: 1 Airway Equipment and Method: stylet Placement Confirmation: ETT inserted through vocal cords under direct vision,  positive ETCO2 and breath sounds checked- equal and bilateral Secured at: 21 cm Tube secured with: Tape Dental Injury: Teeth and Oropharynx as per pre-operative assessment

## 2014-04-18 NOTE — H&P (Signed)
  H&P faxed to surgical center.  -History and Physical Reviewed  -Patient has been re-examined  -No change in the plan of care  Shari Payne A    

## 2014-04-19 ENCOUNTER — Encounter (HOSPITAL_BASED_OUTPATIENT_CLINIC_OR_DEPARTMENT_OTHER): Payer: Self-pay | Admitting: Plastic Surgery

## 2014-05-03 ENCOUNTER — Other Ambulatory Visit: Payer: Self-pay | Admitting: Internal Medicine

## 2014-05-04 ENCOUNTER — Other Ambulatory Visit: Payer: Self-pay | Admitting: Physician Assistant

## 2014-05-04 MED ORDER — LEVOTHYROXINE SODIUM 112 MCG PO TABS: 112.0000 ug | ORAL_TABLET | Freq: Every day | ORAL | Status: DC

## 2014-06-01 NOTE — Op Note (Signed)
NAME:  NARA, PATERNOSTER NO.:  [id number]  MEDICAL RECORD NO.:  [id number]  LOCATION:                                 FACILITY:  PHYSICIAN:  Hetty Blend, M.D.DATE OF BIRTH:  [birth year]  DATE OF PROCEDURE:  04/18/2014 DATE OF DISCHARGE:                              OPERATIVE REPORT   PREOPERATIVE DIAGNOSIS:  Bilateral macromastia.  POSTOPERATIVE DIAGNOSIS:  Bilateral macromastia.  PROCEDURE:  Bilateral reduction mammoplasties.  ATTENDING SURGEON:  Youlanda Roys, M.D.  ANESTHESIA:  General.  ANESTHESIOLOGIST:  Crissie Sickles. Conrad [place], M.D.  COMPLICATIONS:  None.  INDICATIONS FOR THE PROCEDURE:  The patient is a 44 year old female who has bilateral macromastia that is clinically symptomatic.  She presents to undergo bilateral reduction mammoplasties.  DESCRIPTION OF PROCEDURE:  The patient was marked in the preop holding area in the pattern of Wise for the future bilateral reduction mammoplasties.  She was then taken back to the OR, placed on the table in supine position.  After adequate general anesthesia was obtained, the patient's chest was prepped with Techni-Care and draped in sterile fashion.  The bases of the breasts had been injected with 1% lidocaine with epinephrine.  After adequate hemostasis and anesthesia had taken effect, the procedure was begun.  Both of the breast reductions were performed in the following similar manner.  The nipple-areolar complex was marked with a 45-mm nipple marker.  The skin was then incised and deepithelialized around the nipple-areolar complex down to the inframammary crease in the inferior pedicle pattern.  Next, the medial, superior, and lateral skin flaps were elevated down to the chest wall.  Excess fat and glandular tissue removed from the inferior pedicle.  The nipple-areolar complex was examined and found to be pink and viable.  The wound was irrigated with saline irrigation.  Meticulous hemostasis was  obtained with the Bovie electrocautery.  The inferior pedicle was then centralized using 3-0 Prolene suture.  A #10 JP flat fully-fluted drain was placed into the wound.  The skin flaps were brought together at the inverted T junction with a 2-0 Prolene suture.  The incisions were stapled for temporary closure.  The breasts were compared and found to have good shape and symmetry.  The incisions were then closed from the medial aspect of the JP drain to the medial aspect of the [facility] incision by first placing a few 3-0 Monocryl sutures to tack together the dermal layer, and then both the dermal and cuticular layers were closed in a single layer using a 2- 0 Quill PDO barbed suture.  Lateral to the JP drain, incision was closed using 3-0 Monocryl in the dermal layer followed by a 3-0 Monocryl running intracuticular stitch on the skin.  The vertical limb of the Wise pattern was closed with 3-0 Monocryl in the dermal layer.  The patient was placed in the upright position.  The future location of the nipple-areolar complexes was marked on both breast mounds using the 45 mm nipple marker.  She was then placed back in the recumbent position.  Both of the nipple-areolar complexes were brought out onto the breast mounds in the following similar manner.  The skin was incised as marked,  full thickness into the subcutaneous tissues.  The nipple-areolar complex was examined, found to be pink and viable, then brought out through this aperture and sewn in place using 4-0 Monocryl in the dermal layer, followed by a 5-0 Monocryl running intracuticular stitch on the skin.  This 5-0 Monocryl suture was then brought down to close the cuticular layer of the vertical limb as well.  The drain was sewn in place using 3-0 nylon suture.  The incisions were dressed with benzoin and Steri-Strips, and the nipples additionally dressed with bacitracin ointment and Adaptic.  4x4s were placed over the incisions and ABD  pads in the axillary area as well. She was then placed into a light post-operative support bra. Prior to closing the incisions, the pectoralis major muscle and fascia along with the breast and chest wall tissue had been infiltrated with 1.3% Exparel (total 266 mg) to provide postoperative pain relief for the patient.  There were no complications. The patient tolerated the procedure well.  The final needle and sponge counts were reported to be correct at the end of the case.  The patient was then extubated and taken to recovery room in stable condition.  She also recovered without complications.  Both the patient and family were given proper postoperative wound care instructions including care of the JP drain.  She was then discharged home in the care of her family in stable condition.  Followup appointment will be within a few days in the office.          ______________________________ Hetty Blend, M.D.     MC/MEDQ  D:  05/31/2014  T:  05/31/2014  Job:  [id number]

## 2014-07-04 ENCOUNTER — Other Ambulatory Visit: Payer: Self-pay | Admitting: Physician Assistant

## 2014-07-04 MED ORDER — ALPRAZOLAM 0.5 MG PO TABS: 0.5000 mg | ORAL_TABLET | Freq: Three times a day (TID) | ORAL | Status: DC | PRN

## 2014-07-26 ENCOUNTER — Other Ambulatory Visit: Payer: Self-pay

## 2014-07-26 MED ORDER — ESCITALOPRAM OXALATE 20 MG PO TABS: 20.0000 mg | ORAL_TABLET | Freq: Every day | ORAL | Status: DC

## 2014-08-15 ENCOUNTER — Ambulatory Visit (INDEPENDENT_AMBULATORY_CARE_PROVIDER_SITE_OTHER): Payer: BLUE CROSS/BLUE SHIELD | Admitting: Physician Assistant

## 2014-08-15 ENCOUNTER — Encounter: Payer: Self-pay | Admitting: Physician Assistant

## 2014-08-15 VITALS — BP 120/82 | HR 72 | Temp 97.7°F | Resp 16 | Ht 68.0 in | Wt 187.0 lb

## 2014-08-15 DIAGNOSIS — R6889 Other general symptoms and signs: Secondary | ICD-10-CM

## 2014-08-15 DIAGNOSIS — K219 Gastro-esophageal reflux disease without esophagitis: Secondary | ICD-10-CM | POA: Insufficient documentation

## 2014-08-15 DIAGNOSIS — K2100 Gastro-esophageal reflux disease with esophagitis, without bleeding: Secondary | ICD-10-CM

## 2014-08-15 DIAGNOSIS — Z79899 Other long term (current) drug therapy: Secondary | ICD-10-CM

## 2014-08-15 DIAGNOSIS — E079 Disorder of thyroid, unspecified: Secondary | ICD-10-CM

## 2014-08-15 DIAGNOSIS — Z23 Encounter for immunization: Secondary | ICD-10-CM

## 2014-08-15 DIAGNOSIS — R635 Abnormal weight gain: Secondary | ICD-10-CM

## 2014-08-15 DIAGNOSIS — N3281 Overactive bladder: Secondary | ICD-10-CM | POA: Insufficient documentation

## 2014-08-15 DIAGNOSIS — E559 Vitamin D deficiency, unspecified: Secondary | ICD-10-CM

## 2014-08-15 DIAGNOSIS — D649 Anemia, unspecified: Secondary | ICD-10-CM

## 2014-08-15 DIAGNOSIS — F411 Generalized anxiety disorder: Secondary | ICD-10-CM

## 2014-08-15 DIAGNOSIS — N3010 Interstitial cystitis (chronic) without hematuria: Secondary | ICD-10-CM

## 2014-08-15 DIAGNOSIS — Z0001 Encounter for general adult medical examination with abnormal findings: Secondary | ICD-10-CM

## 2014-08-15 LAB — CBC WITH DIFFERENTIAL/PLATELET
BASOS PCT: 1 % (ref 0–1)
Basophils Absolute: 0.1 10*3/uL (ref 0.0–0.1)
Eosinophils Absolute: 0.1 10*3/uL (ref 0.0–0.7)
Eosinophils Relative: 2 % (ref 0–5)
HCT: 41.2 % (ref 36.0–46.0)
Hemoglobin: 13.7 g/dL (ref 12.0–15.0)
LYMPHS PCT: 26 % (ref 12–46)
Lymphs Abs: 1.7 10*3/uL (ref 0.7–4.0)
MCH: 29.3 pg (ref 26.0–34.0)
MCHC: 33.3 g/dL (ref 30.0–36.0)
MCV: 88.2 fL (ref 78.0–100.0)
MONO ABS: 0.5 10*3/uL (ref 0.1–1.0)
MONOS PCT: 8 % (ref 3–12)
MPV: 10.4 fL (ref 8.6–12.4)
Neutro Abs: 4.2 10*3/uL (ref 1.7–7.7)
Neutrophils Relative %: 63 % (ref 43–77)
PLATELETS: 298 10*3/uL (ref 150–400)
RBC: 4.67 MIL/uL (ref 3.87–5.11)
RDW: 13.1 % (ref 11.5–15.5)
WBC: 6.6 10*3/uL (ref 4.0–10.5)

## 2014-08-15 NOTE — Patient Instructions (Addendum)
Before you even begin to attack a weight-loss plan, it pays to remember this: You are not fat. You have fat. Losing weight isn't about blame or shame; it's simply another achievement to accomplish. Dieting is like any other skill-you have to buckle down and work at it. As long as you act in a smart, reasonable way, you'll ultimately get where you want to be. Here are some weight loss pearls for you.  1. It's Not a Diet. It's a Lifestyle Thinking of a diet as something you're on and suffering through only for the short term doesn't work. To shed weight and keep it off, you need to make permanent changes to the way you eat. It's OK to indulge occasionally, of course, but if you cut calories temporarily and then revert to your old way of eating, you'll gain back the weight quicker than you can say yo-yo. Use it to lose it. Research shows that one of the best predictors of long-term weight loss is how many pounds you drop in the first month. For that reason, nutritionists often suggest being stricter for the first two weeks of your new eating strategy to build momentum. Cut out added sugar and alcohol and avoid unrefined carbs. After that, figure out how you can reincorporate them in a way that's healthy and maintainable.  2. There's a Right Way to Exercise Working out burns calories and fat and boosts your metabolism by building muscle. But those trying to lose weight are notorious for overestimating the number of calories they burn and underestimating the amount they take in. Unfortunately, your system is biologically programmed to hold on to extra pounds and that means when you start exercising, your body senses the deficit and ramps up its hunger signals. If you're not diligent, you'll eat everything you burn and then some. Use it to lose it. Cardio gets all the exercise glory, but strength and interval training are the real heroes. They help you build lean muscle, which in turn increases your metabolism and  calorie-burning ability 3. Don't Overreact to Mild Hunger Some people have a hard time losing weight because of hunger anxiety. To them, being hungry is bad-something to be avoided at all costs-so they carry snacks with them and eat when they don't need to. Others eat because they're stressed out or bored. While you never want to get to the point of being ravenous (that's when bingeing is likely to happen), a hunger pang, a craving, or the fact that it's 3:00 p.m. should not send you racing for the vending machine or obsessing about the energy bar in your purse. Ideally, you should put off eating until your stomach is growling and it's difficult to concentrate.  Use it to lose it. When you feel the urge to eat, use the HALT method. Ask yourself, Am I really hungry? Or am I angry or anxious, lonely or bored, or tired? If you're still not certain, try the apple test. If you're truly hungry, an apple should seem delicious; if it doesn't, something else is going on. Or you can try drinking water and making yourself busy, if you are still hungry try a healthy snack.  4. Not All Calories Are Created Equal The mechanics of weight loss are pretty simple: Take in fewer calories than you use for energy. But the kind of food you eat makes all the difference. Processed food that's high in saturated fat and refined starch or sugar can cause inflammation that disrupts the hormone signals that tell  your brain you're full. The result: You eat a lot more.  Use it to lose it. Clean up your diet. Swap in whole, unprocessed foods, including vegetables, lean protein, and healthy fats that will fill you up and give you the biggest nutritional bang for your calorie buck. In a few weeks, as your brain starts receiving regular hunger and fullness signals once again, you'll notice that you feel less hungry overall and naturally start cutting back on the amount you eat.  5. Protein, Produce, and Plant-Based Fats Are Your Weight-Loss  [place] Here's why eating the three Ps regularly will help you drop pounds. Protein fills you up. You need it to build lean muscle, which keeps your metabolism humming so that you can torch more fat. People in a weight-loss program who ate double the recommended daily allowance for protein (about 110 grams for a 150-pound woman) lost 70 percent of their weight from fat, while people who ate the RDA lost only about 40 percent, one study found. Produce is packed with filling fiber. "It's very difficult to consume too many calories if you're eating a lot of vegetables. Example: Three cups of broccoli is a lot of food, yet only 93 calories. (Fruit is another story. It can be easy to overeat and can contain a lot of calories from sugar, so be sure to monitor your intake.) Plant-based fats like olive oil and those in avocados and nuts are healthy and extra satiating.  Use it to lose it. Aim to incorporate each of the three Ps into every meal and snack. People who eat protein throughout the day are able to keep weight off, according to a study in the American Journal of Clinical Nutrition. In addition to meat, poultry and seafood, good sources are beans, lentils, eggs, tofu, and yogurt. As for fat, keep portion sizes in check by measuring out salad dressing, oil, and nut butters (shoot for one to two tablespoons). Finally, eat veggies or a little fruit at every meal. People who did that consumed 308 fewer calories but didn't feel any hungrier than when they didn't eat more produce.  7. How You Eat Is As Important As What You Eat In order for your brain to register that you're full, you need to focus on what you're eating. Sit down whenever you eat, preferably at a table. Turn off the TV or computer, put down your phone, and look at your food. Smell it. Chew slowly, and don't put another bite on your fork until you swallow. When women ate lunch this attentively, they consumed 30 percent less when snacking later than  those who listened to an audiobook at lunchtime, according to a study in the British Journal of Nutrition. 8. Weighing Yourself Really Works The scale provides the best evidence about whether your efforts are paying off. Seeing the numbers tick up or down or stagnate is motivation to keep going-or to rethink your approach. A 2015 study at [employer] found that daily weigh-ins helped people lose more weight, keep it off, and maintain that loss, even after two years. Use it to lose it. Step on the scale at the same time every day for the best results. If your weight shoots up several pounds from one weigh-in to the next, don't freak out. Eating a lot of salt the night before or having your period is the likely culprit. The number should return to normal in a day or two. It's a steady climb that you need to do something about.   9. Too Much Stress and Too Little Sleep Are Your Enemies When you're tired and frazzled, your body cranks up the production of cortisol, the stress hormone that can cause carb cravings. Not getting enough sleep also boosts your levels of ghrelin, a hormone associated with hunger, while suppressing leptin, a hormone that signals fullness and satiety. People on a diet who slept only five and a half hours a night for two weeks lost 55 percent less fat and were hungrier than those who slept eight and a half hours, according to a study in the [employer] Medical Association Journal. Use it to lose it. Prioritize sleep, aiming for seven hours or more a night, which research shows helps lower stress. And make sure you're getting quality zzz's. If a snoring spouse or a fidgety cat wakes you up frequently throughout the night, you may end up getting the equivalent of just four hours of sleep, according to a study from [employer]. Keep pets out of the bedroom, and use a white-noise app to drown out snoring. 10. You Will Hit a plateau-And You Can Bust Through It As you slim down, your  body releases much less leptin, the fullness hormone.  If you're not strength training, start right now. Building muscle can raise your metabolism to help you overcome a plateau. To keep your body challenged and burning calories, incorporate new moves and more intense intervals into your workouts or add another sweat session to your weekly routine. Alternatively, cut an extra 100 calories or so a day from your diet. Now that you've lost weight, your body simply doesn't need as much fuel.   Ways to cut 100 calories  1. Eat your eggs with hot sauce OR salsa instead of cheese.  Eggs are great for breakfast, but many people consider eggs and cheese to be BFFs. Instead of cheese-1 oz. of cheddar has 114 calories-top your eggs with hot sauce, which contains no calories and helps with satiety and metabolism. Salsa is also a great option!!  2. Top your toast, waffles or pancakes with mashed berries instead of jelly or syrup. Half a cup of berries-fresh, frozen or thawed-has about 40 calories, compared with 2 tbsp. of maple syrup or jelly, which both have about 100 calories. The berries will also give you a good punch of fiber, which helps keep you full and satisfied and won't spike blood sugar quickly like the jelly or syrup. 3. Swap the non-fat latte for black coffee with a splash of half-and-half. Contrary to its name, that non-fat latte has 130 calories and a startling 19g of carbohydrates per 16 oz. serving. Replacing that 'light' drinkable dessert with a black coffee with a splash of half-and-half saves you more than 100 calories per 16 oz. serving. 4. Sprinkle salads with freeze-dried raspberries instead of dried cranberries. If you want a sweet addition to your nutritious salad, stay away from dried cranberries. They have a whopping 130 calories per  cup and 30g carbohydrates. Instead, sprinkle freeze-dried raspberries guilt-free and save more than 100 calories per  cup serving, adding 3g of belly-filling  fiber. 5. Go for mustard in place of mayo on your sandwich. Mustard can add really nice flavor to any sandwich, and there are tons of varieties, from spicy to honey. A serving of mayo is 95 calories, versus 10 calories in a serving of mustard. 6. Choose a DIY salad dressing instead of the store-bought kind. Mix Dijon or whole grain mustard with low-fat Kefir or red wine vinegar   and garlic. 7. Use hummus as a spread instead of a dip. Use hummus as a spread on a high-fiber cracker or tortilla with a sandwich and save on calories without sacrificing taste. 8. Pick just one salad "accessory." Salad isn't automatically a calorie winner. It's easy to over-accessorize with toppings. Instead of topping your salad with nuts, avocado and cranberries (all three will clock in at 313 calories), just pick one. The next day, choose a different accessory, which will also keep your salad interesting. You don't wear all your jewelry every day, right? 9. Ditch the white pasta in favor of spaghetti squash. One cup of cooked spaghetti squash has about 40 calories, compared with traditional spaghetti, which comes with more than 200. Spaghetti squash is also nutrient-dense. It's a good source of fiber and Vitamins A and C, and it can be eaten just like you would eat pasta-with a great tomato sauce and [place] meatballs or with pesto, tofu and spinach, for example. 10. Dress up your chili, soups and stews with non-fat [place] yogurt instead of sour cream. Just a 'dollop' of sour cream can set you back 115 calories and a whopping 12g of fat-seven of which are of the artery-clogging variety. Added bonus: [place] yogurt is packed with muscle-building protein, calcium and B Vitamins. 11. Mash cauliflower instead of mashed potatoes. One cup of traditional mashed potatoes-in all their creamy goodness-has more than 200 calories, compared to mashed cauliflower, which you can typically eat for less than 100 calories per 1 cup serving.  Cauliflower is a great source of the antioxidant indole-3-carbinol (I3C), which may help reduce the risk of some cancers, like breast cancer. 12. Ditch the ice cream sundae in favor of a [place] yogurt parfait. Instead of a cup of ice cream or fro-yo for dessert, try 1 cup of nonfat Greek yogurt topped with fresh berries and a sprinkle of cacao nibs. Both toppings are packed with antioxidants, which can help reduce cellular inflammation and oxidative damage. And the comparison is a no-brainer: One cup of ice cream has about 275 calories; one cup of frozen yogurt has about 230; and a cup of Greek yogurt has just 130, plus twice the protein, so you're less likely to return to the freezer for a second helping. 13. Put olive oil in a spray container instead of using it directly from the bottle. Each tablespoon of olive oil is 120 calories and 15g of fat. Use a mister instead of pouring it straight into the pan or onto a salad. This allows for portion control and will save you more than 100 calories. 14. When baking, substitute canned pumpkin for butter or oil. Canned pumpkin-not pumpkin pie mix-is loaded with Vitamin A, which is important for skin and eye health, as well as immunity. And the comparisons are pretty crazy:  cup of canned pumpkin has about 40 calories, compared to butter or oil, which has more than 800 calories. Yes, 800 calories. Applesauce and mashed banana can also serve as good substitutions for butter or oil, usually in a 1:1 ratio. 15. Top casseroles with high-fiber cereal instead of breadcrumbs. Breadcrumbs are typically made with white bread, while breakfast cereals contain 5-9g of fiber per serving. Not only will you save more than 150 calories per  cup serving, the swap will also keep you more full and you'll get a metabolism boost from the added fiber. 16. Snack on pistachios instead of macadamia nuts. Believe it or not, you get the same amount of calories from 35  pistachios (100  calories) as you would from only five macadamia nuts. 17. Chow down on kale chips rather than potato chips. This is my favorite 'don't knock it 'till you try it' swap. Kale chips are so easy to make at home, and you can spice them up with a little grated parmesan or chili powder. Plus, they're a mere fraction of the calories of potato chips, but with the same crunch factor we crave so often. 18. Add seltzer and some fruit slices to your cocktail instead of soda or fruit juice. One cup of soda or fruit juice can pack on as much as 140 calories. Instead, use seltzer and fruit slices. The fruit provides valuable phytochemicals, such as flavonoids and anthocyanins, which help to combat cancer and stave off the aging process.  Nexium/protonix/prilosec are called PPI's, they are great at healing your stomach but should only be taken for a short period of time.   Studies are showing that taken for a long time it can increase the risk of osteoporosis (weakening of your bones), pneumonia, low magnesium, restless legs, Cdiff (infection that causes diarrhea), and most recently kidney disease/insufficiency.  Due to this information we want to try to stop the PPI but if you try to stop it abruptly this can cause rebound acid and worsening symptoms.   So this is how we want you to get off the PPI: - Start taking the nexium/protonix/prilosec/PPI  every other day with pepcid or zantac (generic is fine) 2 x a day for 2 weeks - then decrease the PPI to every 3 days while taking the zantac or pepcid twice a day the other 2 days for 2 weeks - then you can try the zantac or pepcid once at night for 2 weeks - you can continue on this once at night or stop all together - Avoid alcohol, spicy foods, NSAIDS (aleve, ibuprofen) at this time. See foods below.   Food Choices for Gastroesophageal Reflux Disease When you have gastroesophageal reflux disease (GERD), the foods you eat and your eating habits are very important.  Choosing the right foods can help ease the discomfort of GERD. WHAT GENERAL GUIDELINES DO I NEED TO FOLLOW?  Choose fruits, vegetables, whole grains, low-fat dairy products, and low-fat meat, fish, and poultry.  Limit fats such as oils, salad dressings, butter, nuts, and avocado.  Keep a food diary to identify foods that cause symptoms.  Avoid foods that cause reflux. These may be different for different people.  Eat frequent small meals instead of three large meals each day.  Eat your meals slowly, in a relaxed setting.  Limit fried foods.  Cook foods using methods other than frying.  Avoid drinking alcohol.  Avoid drinking large amounts of liquids with your meals.  Avoid bending over or lying down until 2-3 hours after eating. WHAT FOODS ARE NOT RECOMMENDED? The following are some foods and drinks that may worsen your symptoms: Vegetables Tomatoes. Tomato juice. Tomato and spaghetti sauce. Chili peppers. Onion and garlic. Horseradish. Fruits Oranges, grapefruit, and lemon (fruit and juice). Meats High-fat meats, fish, and poultry. This includes hot dogs, ribs, ham, sausage, salami, and bacon. Dairy Whole milk and chocolate milk. Sour cream. Cream. Butter. Ice cream. Cream cheese.  Beverages Coffee and tea, with or without caffeine. Carbonated beverages or energy drinks. Condiments Hot sauce. Barbecue sauce.  Sweets/Desserts Chocolate and cocoa. Donuts. Peppermint and spearmint. Fats and Oils High-fat foods, including [place] fries and potato chips. Other Vinegar. Strong spices, such as black  pepper, white pepper, red pepper, cayenne, curry powder, cloves, ginger, and chili powder. Nexium/protonix/prilosec are called PPI's, they are great at healing your stomach but should only be taken for a short period of time.

## 2014-08-15 NOTE — Progress Notes (Signed)
Complete Physical  Assessment and Plan: 1. Thyroid disease check TSH level, continue medications the same, reminded to take on an empty stomach 30-49mins before food.  - BASIC METABOLIC PANEL WITH GFR - Hepatic function panel - TSH  2. Anemia, unspecified anemia type - CBC with Differential/Platelet  3. Vitamin D deficiency - Vit D  25 hydroxy (rtn osteoporosis monitoring)  4. Interstitial cystitis Continue amitriptyline and avoid triggers - Microalbumin / creatinine urine ratio - Urinalysis, Routine w reflex microscopic  5. Generalized anxiety disorder continue medications, stress management techniques discussed, increase water, good sleep hygiene discussed, increase exercise, and increase veggies.   6. Gastroesophageal reflux disease with esophagitis Continue PPI/H2 blocker, diet discussed  7. Need for Tdap vaccination - Tdap vaccine greater than or equal to 7yo IM  8. Medication management - Magnesium  9. Weight gain - Lipid panel - Hemoglobin A1c - Insulin, fasting  Discussed med's effects and SE's. Screening labs and tests as requested with regular follow-up as recommended. Over 40 minutes of exam, counseling, chart review, and critical decision making was performed this visit.   HPI  44 y.o. female  presents for a complete physical.  Her blood pressure has been controlled at home, today their BP is BP: 120/82 mmHg She does workout. She denies chest pain, shortness of breath, dizziness.  She is not on cholesterol medication and denies myalgias. Her cholesterol is at goal. The cholesterol last visit was:  LDL 96, HDL 68.  Last A1C in the office was:  5.4 She is following with her OB/GYN, Dr. Melba Coon, for ICS and is on amitriptyline, will take 25 mg nightly and will do 75 mg very rare for flares.  Patient is not on Vitamin D supplement, last vitamin D was 29.   Had breast reduction in Dec 2015, states it is wonderful, has less back pain/neck pain, she is able to go  on runs know.  She is not on prilosec due to new info, she take rolaids, still has some GERD. Does take aleve PRN. 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 She is on thyroid medication. Her medication was not changed last visit.   Lab Results  Component Value Date   TSH 1.708 08/18/2013  .    Current Medications:  Current Outpatient Prescriptions on File Prior to Visit  Medication Sig Dispense Refill  . ALPRAZolam (XANAX) 0.5 MG tablet Take 1 tablet (0.5 mg total) by mouth 3 (three) times daily as needed for anxiety. 90 tablet 0  . amitriptyline (ELAVIL) 10 MG tablet 1-2 pills at bedtime (Patient taking differently: Take 25 mg by mouth at bedtime. 1-2 pills at bedtime) 180 tablet 3  . escitalopram (LEXAPRO) 20 MG tablet Take 1 tablet (20 mg total) by mouth daily. 90 tablet 1  . levothyroxine (SYNTHROID, LEVOTHROID) 112 MCG tablet Take 1 tablet (112 mcg total) by mouth daily before breakfast. 90 tablet 0  . omeprazole (PRILOSEC) 40 MG capsule Take 40 mg by mouth daily.     No current facility-administered medications on file prior to visit.   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 Melba Coon, 2015 MGM:09/2013  DEXA: N/A Colonoscopy:N/A EGD: N/A CXR 08/2013 Last Dental Exam: Does have, unknown Last Eye Exam: Dr. Alois Cliche  Patient Care Team: Unk Pinto, MD as  PCP - General (Internal Medicine)  Allergies:  Allergies  Allergen Reactions  . Amoxicillin Rash and Other (See Comments)    FEVER   Medical History:  Past Medical History  Diagnosis Date  . GERD (gastroesophageal reflux disease)   . Hypothyroidism   . Interstitial cystitis   . Anxiety   . Breast hypertrophy 03/2014   Surgical History:  Past Surgical History  Procedure Laterality Date  . No past surgeries    . Breast reduction surgery  Bilateral 04/18/2014    Procedure: MAMMARY REDUCTION  (BREAST) BILATERAL;  Surgeon: Charlene Brooke, MD;  Location: [facility];  Service: Plastics;  Laterality: Bilateral;   Family History: No family history on file. Social History:  History  Substance Use Topics  . Smoking status: Former Smoker    Quit date: 05/18/2012  . Smokeless tobacco: Never Used  . Alcohol Use: Yes     Comment: weekends   Review of Systems: Review of Systems  Constitutional: Negative.   HENT: Negative.   Eyes: Negative.   Respiratory: Negative.   Cardiovascular: Negative.   Gastrointestinal: Positive for heartburn and constipation. Negative for nausea, vomiting, abdominal pain, diarrhea, blood in stool and melena.  Genitourinary: Positive for dysuria, urgency and frequency. Negative for hematuria and flank pain.  Musculoskeletal: Negative.   Skin: Negative.   Neurological: Negative.   Psychiatric/Behavioral: Negative.     Physical Exam: Estimated body mass index is 28.44 kg/(m^2) as calculated from the following:   Height as of this encounter: 5\' 8"  (1.727 m).   Weight as of this encounter: 187 lb (84.823 kg). BP 120/82 mmHg  Pulse 72  Temp(Src) 97.7 F (36.5 C)  Resp 16  Ht 5\' 8"  (1.727 m)  Wt 187 lb (84.823 kg)  BMI 28.44 kg/m2  LMP 08/03/2014 (Approximate)  Wt Readings from Last 3 Encounters:  08/15/14 187 lb (84.823 kg)  04/18/14 188 lb (85.276 kg)  11/10/13 175 lb (79.379 kg)   General Appearance: Well nourished, in no apparent distress.  Eyes: PERRLA, EOMs, conjunctiva no swelling or erythema, normal fundi and vessels.  Sinuses: No Frontal/maxillary tenderness  ENT/Mouth: Ext aud canals clear, normal light reflex with TMs without erythema, bulging. Good dentition. No erythema, swelling, or exudate on post pharynx. Tonsils not swollen or erythematous. Hearing normal.  Neck: Supple, thyroid normal. No bruits  Respiratory: Respiratory effort normal, BS equal bilaterally  without rales, rhonchi, wheezing or stridor.  Cardio: RRR without murmurs, rubs or gallops. Brisk peripheral pulses without edema.  Chest: symmetric, with normal excursions and percussion.  Breasts: defer Abdomen: Soft, nontender, no guarding, rebound, hernias, masses, or organomegaly.  Lymphatics: Non tender without lymphadenopathy.  Genitourinary: defer Musculoskeletal: Full ROM all peripheral extremities,5/5 strength, and normal gait.  Skin: Warm, dry without rashes, lesions, ecchymosis. Neuro: Cranial nerves intact, reflexes equal bilaterally. Normal muscle tone, no cerebellar symptoms. Sensation intact.  Psych: Awake and oriented X 3, normal affect, Insight and Judgment appropriate.   EKG: defer  Vicie Mutters 2:27 PM [facility]

## 2014-08-16 LAB — LIPID PANEL
CHOL/HDL RATIO: 2.7 ratio
Cholesterol: 210 mg/dL — ABNORMAL HIGH (ref 0–200)
HDL: 77 mg/dL (ref >=46)
LDL Cholesterol: 119 mg/dL — ABNORMAL HIGH (ref 0–99)
Triglycerides: 72 mg/dL (ref <150)
VLDL: 14 mg/dL (ref 0–40)

## 2014-08-16 LAB — URINALYSIS, ROUTINE W REFLEX MICROSCOPIC
Bilirubin Urine: NEGATIVE
GLUCOSE, UA: NEGATIVE mg/dL
Hgb urine dipstick: NEGATIVE
Ketones, ur: NEGATIVE mg/dL
LEUKOCYTES UA: NEGATIVE
Nitrite: NEGATIVE
PH: 5.5 (ref 5.0–8.0)
Protein, ur: NEGATIVE mg/dL
Specific Gravity, Urine: 1.007 (ref 1.005–1.030)
UROBILINOGEN UA: 0.2 mg/dL (ref 0.0–1.0)

## 2014-08-16 LAB — BASIC METABOLIC PANEL WITH GFR
BUN: 18 mg/dL (ref 6–23)
CO2: 26 mEq/L (ref 19–32)
CREATININE: 0.92 mg/dL (ref 0.50–1.10)
Calcium: 9.6 mg/dL (ref 8.4–10.5)
Chloride: 101 mEq/L (ref 96–112)
GFR, Est African American: 89 mL/min
GFR, Est Non African American: 78 mL/min
GLUCOSE: 83 mg/dL (ref 70–99)
Potassium: 4 mEq/L (ref 3.5–5.3)
Sodium: 139 mEq/L (ref 135–145)

## 2014-08-16 LAB — MICROALBUMIN / CREATININE URINE RATIO: CREATININE, URINE: 46.4 mg/dL

## 2014-08-16 LAB — INSULIN, FASTING: Insulin fasting, serum: 2.1 u[IU]/mL (ref 2.0–19.6)

## 2014-08-16 LAB — TSH: TSH: 10.924 u[IU]/mL — ABNORMAL HIGH (ref 0.350–4.500)

## 2014-08-16 LAB — HEPATIC FUNCTION PANEL
ALT: 14 U/L (ref 0–35)
AST: 16 U/L (ref 0–37)
Albumin: 4.4 g/dL (ref 3.5–5.2)
Alkaline Phosphatase: 58 U/L (ref 39–117)
Bilirubin, Direct: 0.1 mg/dL (ref 0.0–0.3)
Indirect Bilirubin: 0.3 mg/dL (ref 0.2–1.2)
Total Bilirubin: 0.4 mg/dL (ref 0.2–1.2)
Total Protein: 7.6 g/dL (ref 6.0–8.3)

## 2014-08-16 LAB — MAGNESIUM: Magnesium: 2.1 mg/dL (ref 1.5–2.5)

## 2014-08-16 LAB — VITAMIN D 25 HYDROXY (VIT D DEFICIENCY, FRACTURES): Vit D, 25-Hydroxy: 25 ng/mL — ABNORMAL LOW (ref 30–100)

## 2014-08-16 LAB — HEMOGLOBIN A1C
Hgb A1c MFr Bld: 5.6 % (ref <5.7)
MEAN PLASMA GLUCOSE: 114 mg/dL (ref <117)

## 2014-08-17 ENCOUNTER — Encounter: Payer: Self-pay | Admitting: Physician Assistant

## 2014-08-21 ENCOUNTER — Other Ambulatory Visit: Payer: Self-pay | Admitting: *Deleted

## 2014-08-21 MED ORDER — LEVOTHYROXINE SODIUM 112 MCG PO TABS: 112.0000 ug | ORAL_TABLET | Freq: Every day | ORAL | Status: DC

## 2014-08-24 ENCOUNTER — Encounter: Payer: Self-pay | Admitting: *Deleted

## 2014-09-19 ENCOUNTER — Other Ambulatory Visit: Payer: 59

## 2014-09-19 DIAGNOSIS — N289 Disorder of kidney and ureter, unspecified: Secondary | ICD-10-CM

## 2014-09-19 DIAGNOSIS — E079 Disorder of thyroid, unspecified: Secondary | ICD-10-CM

## 2014-09-19 LAB — BASIC METABOLIC PANEL WITH GFR
BUN: 17 mg/dL (ref 6–23)
CALCIUM: 9.3 mg/dL (ref 8.4–10.5)
CO2: 28 mEq/L (ref 19–32)
Chloride: 102 mEq/L (ref 96–112)
Creat: 0.84 mg/dL (ref 0.50–1.10)
GFR, Est Non African American: 87 mL/min
GLUCOSE: 88 mg/dL (ref 70–99)
Potassium: 4.4 mEq/L (ref 3.5–5.3)
Sodium: 139 mEq/L (ref 135–145)

## 2014-09-19 LAB — TSH: TSH: 0.247 u[IU]/mL — AB (ref 0.350–4.500)

## 2014-11-22 ENCOUNTER — Other Ambulatory Visit: Payer: Self-pay | Admitting: *Deleted

## 2014-11-22 MED ORDER — ALPRAZOLAM 0.5 MG PO TABS: 0.5000 mg | ORAL_TABLET | Freq: Three times a day (TID) | ORAL | Status: DC | PRN

## 2014-11-29 ENCOUNTER — Encounter: Payer: Self-pay | Admitting: Physician Assistant

## 2014-11-30 ENCOUNTER — Other Ambulatory Visit: Payer: Self-pay

## 2014-11-30 DIAGNOSIS — E079 Disorder of thyroid, unspecified: Secondary | ICD-10-CM

## 2014-11-30 DIAGNOSIS — R6889 Other general symptoms and signs: Secondary | ICD-10-CM

## 2014-11-30 MED ORDER — ESCITALOPRAM OXALATE 10 MG PO TABS: 10.0000 mg | ORAL_TABLET | Freq: Every day | ORAL | Status: DC

## 2015-02-15 ENCOUNTER — Ambulatory Visit: Payer: Self-pay | Admitting: Physician Assistant

## 2015-02-19 ENCOUNTER — Ambulatory Visit: Payer: Self-pay | Admitting: Physician Assistant

## 2015-02-23 ENCOUNTER — Ambulatory Visit (INDEPENDENT_AMBULATORY_CARE_PROVIDER_SITE_OTHER): Payer: 59 | Admitting: Physician Assistant

## 2015-02-23 ENCOUNTER — Encounter: Payer: Self-pay | Admitting: Physician Assistant

## 2015-02-23 VITALS — BP 118/64 | HR 83 | Temp 97.5°F | Resp 16 | Ht 68.0 in | Wt 187.0 lb

## 2015-02-23 DIAGNOSIS — Z79899 Other long term (current) drug therapy: Secondary | ICD-10-CM

## 2015-02-23 DIAGNOSIS — K2100 Gastro-esophageal reflux disease with esophagitis, without bleeding: Secondary | ICD-10-CM

## 2015-02-23 DIAGNOSIS — E559 Vitamin D deficiency, unspecified: Secondary | ICD-10-CM

## 2015-02-23 DIAGNOSIS — D649 Anemia, unspecified: Secondary | ICD-10-CM

## 2015-02-23 DIAGNOSIS — F411 Generalized anxiety disorder: Secondary | ICD-10-CM

## 2015-02-23 DIAGNOSIS — E079 Disorder of thyroid, unspecified: Secondary | ICD-10-CM | POA: Diagnosis not present

## 2015-02-23 DIAGNOSIS — N3010 Interstitial cystitis (chronic) without hematuria: Secondary | ICD-10-CM | POA: Diagnosis not present

## 2015-02-23 LAB — CBC WITH DIFFERENTIAL/PLATELET
BASOS PCT: 1 % (ref 0–1)
Basophils Absolute: 0.1 10*3/uL (ref 0.0–0.1)
Eosinophils Absolute: 0.2 10*3/uL (ref 0.0–0.7)
Eosinophils Relative: 4 % (ref 0–5)
HCT: 40.7 % (ref 36.0–46.0)
HEMOGLOBIN: 14 g/dL (ref 12.0–15.0)
Lymphocytes Relative: 29 % (ref 12–46)
Lymphs Abs: 1.7 10*3/uL (ref 0.7–4.0)
MCH: 29.9 pg (ref 26.0–34.0)
MCHC: 34.4 g/dL (ref 30.0–36.0)
MCV: 86.8 fL (ref 78.0–100.0)
MPV: 10.9 fL (ref 8.6–12.4)
Monocytes Absolute: 0.8 10*3/uL (ref 0.1–1.0)
Monocytes Relative: 13 % — ABNORMAL HIGH (ref 3–12)
NEUTROS ABS: 3.1 10*3/uL (ref 1.7–7.7)
Neutrophils Relative %: 53 % (ref 43–77)
Platelets: 269 10*3/uL (ref 150–400)
RBC: 4.69 MIL/uL (ref 3.87–5.11)
RDW: 13.6 % (ref 11.5–15.5)
WBC: 5.9 10*3/uL (ref 4.0–10.5)

## 2015-02-23 MED ORDER — AMITRIPTYLINE HCL 25 MG PO TABS: 25.0000 mg | ORAL_TABLET | Freq: Every evening | ORAL | Status: DC | PRN

## 2015-02-23 MED ORDER — ESCITALOPRAM OXALATE 10 MG PO TABS: 10.0000 mg | ORAL_TABLET | Freq: Every day | ORAL | Status: DC

## 2015-02-23 MED ORDER — LEVOTHYROXINE SODIUM 112 MCG PO TABS: 112.0000 ug | ORAL_TABLET | Freq: Every day | ORAL | Status: DC

## 2015-02-23 NOTE — Progress Notes (Signed)
Assessment and Plan:  1. Thyroid disease Hypothyroidism-check TSH level, continue medications the same, reminded to take on an empty stomach 30-40mins before food.  - BASIC METABOLIC PANEL WITH GFR - Hepatic function panel - TSH  2. Vitamin D deficiency - Vit D  25 hydroxy (rtn osteoporosis monitoring)  3. Generalized anxiety disorder Continue medications  4. Anemia, unspecified anemia type - CBC with Differential/Platelet  5. Interstitial cystitis Continue meds  6. Gastroesophageal reflux disease with esophagitis Continue PPI/H2 blocker, diet discussed  7. Medication management   Continue diet and meds as discussed. Further disposition pending results of labs. Over 30 minutes of exam, counseling, chart review, and critical decision making was performed  HPI 44 y.o. female  presents for 6 month follow up on thyroid, anemia, vitamin D, anxiety, GERD.   Her blood pressure has been controlled at home, today their BP is BP: 118/64 mmHg  She does workout, but has not been able to run in the last month. She denies chest pain, shortness of breath, dizziness.  She is not on cholesterol medication and denies myalgias. Her cholesterol is at goal. The cholesterol last visit was:   Lab Results  Component Value Date   CHOL 210* 08/15/2014   HDL 77 08/15/2014   LDLCALC 119* 08/15/2014   TRIG 72 08/15/2014   CHOLHDL 2.7 08/15/2014     Last A1C in the office was:  Lab Results  Component Value Date   HGBA1C 5.6 08/15/2014   Patient is on Vitamin D supplement.   Lab Results  Component Value Date   VD25OH 25* 08/15/2014     She was put on amtitriptyline 25mg  nightly for ICS that has helped.  Started cardiac nursing at [facility] 1 month.  She is on lexapro for anxiety that is helping.  She is on thyroid medication. Her medication was changed last visit, she is on 112 every day, was suppose to come back in for recheck of TSH but never did.  Lab Results  Component Value Date   TSH 0.247* 09/19/2014  .  BMI is Body mass index is 28.44 kg/(m^2)., she is working on diet and exercise. Wt Readings from Last 3 Encounters:  02/23/15 187 lb (84.823 kg)  08/15/14 187 lb (84.823 kg)  04/18/14 188 lb (85.276 kg)     Current Medications:  Current Outpatient Prescriptions on File Prior to Visit  Medication Sig Dispense Refill  . ALPRAZolam (XANAX) 0.5 MG tablet Take 1 tablet (0.5 mg total) by mouth 3 (three) times daily as needed for anxiety. 90 tablet 0  . amitriptyline (ELAVIL) 25 MG tablet   3  . escitalopram (LEXAPRO) 10 MG tablet Take 1 tablet (10 mg total) by mouth daily. 90 tablet 3  . levothyroxine (SYNTHROID, LEVOTHROID) 112 MCG tablet Take 1 tablet (112 mcg total) by mouth daily before breakfast. 90 tablet 3   No current facility-administered medications on file prior to visit.   Medical History:  Past Medical History  Diagnosis Date  . GERD (gastroesophageal reflux disease)   . Hypothyroidism   . Interstitial cystitis   . Anxiety   . Breast hypertrophy 03/2014   Allergies:  Allergies  Allergen Reactions  . Amoxicillin Rash and Other (See Comments)    FEVER     Review of Systems:  Review of Systems  Constitutional: Negative.   HENT: Negative.   Eyes: Negative.   Respiratory: Negative.   Cardiovascular: Negative.   Gastrointestinal: Negative.   Genitourinary: Negative.   Musculoskeletal: Negative.  Skin: Negative.   Neurological: Negative.   Endo/Heme/Allergies: Negative.   Psychiatric/Behavioral: Negative.     Family history- Review and unchanged Social history- Review and unchanged Physical Exam: BP 118/64 mmHg  Pulse 83  Temp(Src) 97.5 F (36.4 C)  Resp 16  Ht 5\' 8"  (1.727 m)  Wt 187 lb (84.823 kg)  BMI 28.44 kg/m2  SpO2 91%  LMP 02/09/2015 Wt Readings from Last 3 Encounters:  02/23/15 187 lb (84.823 kg)  08/15/14 187 lb (84.823 kg)  04/18/14 188 lb (85.276 kg)   General Appearance: Well nourished, in no apparent  distress. Eyes: PERRLA, EOMs, conjunctiva no swelling or erythema Sinuses: No Frontal/maxillary tenderness ENT/Mouth: Ext aud canals clear, TMs without erythema, bulging. No erythema, swelling, or exudate on post pharynx.  Tonsils not swollen or erythematous. Hearing normal.  Neck: Supple, thyroid normal.  Respiratory: Respiratory effort normal, BS equal bilaterally without rales, rhonchi, wheezing or stridor.  Cardio: RRR with no MRGs. Brisk peripheral pulses without edema.  Abdomen: Soft, + BS,  Non tender, no guarding, rebound, hernias, masses. Lymphatics: Non tender without lymphadenopathy.  Musculoskeletal: Full ROM, 5/5 strength, Normal gait Skin: Warm, dry without rashes, lesions, ecchymosis.  Neuro: Cranial nerves intact. Normal muscle tone, no cerebellar symptoms. Psych: Awake and oriented X 3, normal affect, Insight and Judgment appropriate.    Vicie Mutters, PA-C 10:43 AM [facility]

## 2015-02-23 NOTE — Patient Instructions (Signed)

## 2015-02-24 LAB — HEPATIC FUNCTION PANEL
ALK PHOS: 45 U/L (ref 33–115)
ALT: 14 U/L (ref 6–29)
AST: 20 U/L (ref 10–30)
Albumin: 3.9 g/dL (ref 3.6–5.1)
BILIRUBIN DIRECT: 0.1 mg/dL (ref <=0.2)
BILIRUBIN INDIRECT: 0.4 mg/dL (ref 0.2–1.2)
Total Bilirubin: 0.5 mg/dL (ref 0.2–1.2)
Total Protein: 6.8 g/dL (ref 6.1–8.1)

## 2015-02-24 LAB — BASIC METABOLIC PANEL WITH GFR
BUN: 11 mg/dL (ref 7–25)
CHLORIDE: 106 mmol/L (ref 98–110)
CO2: 31 mmol/L (ref 20–31)
Calcium: 9.4 mg/dL (ref 8.6–10.2)
Creat: 0.89 mg/dL (ref 0.50–1.10)
GFR, EST NON AFRICAN AMERICAN: 81 mL/min (ref >=60)
GFR, Est African American: >89 mL/min (ref >=60)
Glucose, Bld: 93 mg/dL (ref 65–99)
Potassium: 5.2 mmol/L (ref 3.5–5.3)
SODIUM: 144 mmol/L (ref 135–146)

## 2015-02-24 LAB — TSH: TSH: 6.019 u[IU]/mL — ABNORMAL HIGH (ref 0.350–4.500)

## 2015-02-24 LAB — VITAMIN D 25 HYDROXY (VIT D DEFICIENCY, FRACTURES): Vit D, 25-Hydroxy: 30 ng/mL (ref 30–100)

## 2015-04-25 ENCOUNTER — Other Ambulatory Visit: Payer: Self-pay | Admitting: Internal Medicine

## 2015-04-25 ENCOUNTER — Encounter: Payer: Self-pay | Admitting: Physician Assistant

## 2015-04-25 MED ORDER — ALPRAZOLAM 0.5 MG PO TABS: 0.5000 mg | ORAL_TABLET | Freq: Three times a day (TID) | ORAL | Status: DC | PRN

## 2015-04-25 NOTE — Telephone Encounter (Signed)
Rx called into [place] outpatient pharmacy.

## 2015-04-25 NOTE — Telephone Encounter (Signed)
Sent in xanax

## 2015-06-19 MED FILL — LEVOTHYROXINE 112 MCG TAB: 112 | 90 days supply | Qty: 90 | Fill #1

## 2015-07-03 MED FILL — ESCITALOPRAM 10 MG TABLET: 10 | 90 days supply | Qty: 90 | Fill #1

## 2015-07-03 MED FILL — AMITRIPTYLINE HCL 25 MG TAB: 25 | 90 days supply | Qty: 90 | Fill #1

## 2015-08-27 ENCOUNTER — Encounter: Payer: Self-pay | Admitting: Physician Assistant

## 2015-08-28 ENCOUNTER — Other Ambulatory Visit: Payer: Self-pay | Admitting: Physician Assistant

## 2015-08-28 MED FILL — ALPRAZolam 0.5 MG TABS: 0.5 | 30 days supply | Qty: 90 | Fill #0

## 2015-08-28 NOTE — Telephone Encounter (Signed)
RX CALLED INTO THE [place] [facility].

## 2015-08-29 ENCOUNTER — Other Ambulatory Visit: Payer: Self-pay | Admitting: Physician Assistant

## 2015-08-29 MED ORDER — AMITRIPTYLINE HCL 25 MG PO TABS: 25.0000 mg | ORAL_TABLET | Freq: Three times a day (TID) | ORAL | Status: DC | PRN

## 2015-08-29 MED FILL — AMITRIPTYLINE HCL 25 MG TAB: 25 | 30 days supply | Qty: 90 | Fill #0

## 2015-09-18 ENCOUNTER — Encounter: Payer: Self-pay | Admitting: Physician Assistant

## 2015-09-18 ENCOUNTER — Ambulatory Visit (INDEPENDENT_AMBULATORY_CARE_PROVIDER_SITE_OTHER): Payer: 59 | Admitting: Physician Assistant

## 2015-09-18 VITALS — BP 118/76 | HR 99 | Temp 97.7°F | Resp 16 | Ht 69.0 in | Wt 184.8 lb

## 2015-09-18 DIAGNOSIS — F411 Generalized anxiety disorder: Secondary | ICD-10-CM

## 2015-09-18 DIAGNOSIS — Z79899 Other long term (current) drug therapy: Secondary | ICD-10-CM | POA: Diagnosis not present

## 2015-09-18 DIAGNOSIS — Z13220 Encounter for screening for lipoid disorders: Secondary | ICD-10-CM

## 2015-09-18 DIAGNOSIS — N3010 Interstitial cystitis (chronic) without hematuria: Secondary | ICD-10-CM

## 2015-09-18 DIAGNOSIS — Z1389 Encounter for screening for other disorder: Secondary | ICD-10-CM

## 2015-09-18 DIAGNOSIS — E079 Disorder of thyroid, unspecified: Secondary | ICD-10-CM | POA: Diagnosis not present

## 2015-09-18 DIAGNOSIS — K2100 Gastro-esophageal reflux disease with esophagitis, without bleeding: Secondary | ICD-10-CM

## 2015-09-18 DIAGNOSIS — D649 Anemia, unspecified: Secondary | ICD-10-CM

## 2015-09-18 DIAGNOSIS — Z0000 Encounter for general adult medical examination without abnormal findings: Secondary | ICD-10-CM | POA: Diagnosis not present

## 2015-09-18 DIAGNOSIS — R6889 Other general symptoms and signs: Secondary | ICD-10-CM | POA: Diagnosis not present

## 2015-09-18 DIAGNOSIS — Z0001 Encounter for general adult medical examination with abnormal findings: Secondary | ICD-10-CM | POA: Diagnosis not present

## 2015-09-18 DIAGNOSIS — E559 Vitamin D deficiency, unspecified: Secondary | ICD-10-CM

## 2015-09-18 DIAGNOSIS — Z139 Encounter for screening, unspecified: Secondary | ICD-10-CM | POA: Diagnosis not present

## 2015-09-18 LAB — BASIC METABOLIC PANEL WITH GFR
BUN: 19 mg/dL (ref 7–25)
CALCIUM: 9 mg/dL (ref 8.6–10.2)
CO2: 28 mmol/L (ref 20–31)
CREATININE: 0.95 mg/dL (ref 0.50–1.10)
Chloride: 103 mmol/L (ref 98–110)
GFR, EST AFRICAN AMERICAN: 85 mL/min (ref >=60)
GFR, Est Non African American: 74 mL/min (ref >=60)
Glucose, Bld: 93 mg/dL (ref 65–99)
Potassium: 4 mmol/L (ref 3.5–5.3)
SODIUM: 138 mmol/L (ref 135–146)

## 2015-09-18 LAB — HEPATIC FUNCTION PANEL
ALBUMIN: 4.3 g/dL (ref 3.6–5.1)
ALT: 10 U/L (ref 6–29)
AST: 14 U/L (ref 10–30)
Alkaline Phosphatase: 55 U/L (ref 33–115)
BILIRUBIN DIRECT: 0.1 mg/dL (ref <=0.2)
BILIRUBIN TOTAL: 0.3 mg/dL (ref 0.2–1.2)
Indirect Bilirubin: 0.2 mg/dL (ref 0.2–1.2)
Total Protein: 6.8 g/dL (ref 6.1–8.1)

## 2015-09-18 LAB — LIPID PANEL
CHOL/HDL RATIO: 2.5 ratio (ref <=5.0)
CHOLESTEROL: 183 mg/dL (ref 125–200)
HDL: 72 mg/dL (ref >=46)
LDL Cholesterol: 97 mg/dL (ref <130)
Triglycerides: 70 mg/dL (ref <150)
VLDL: 14 mg/dL (ref <30)

## 2015-09-18 LAB — CBC WITH DIFFERENTIAL/PLATELET
BASOS ABS: 68 {cells}/uL (ref 0–200)
Basophils Relative: 1 %
EOS ABS: 204 {cells}/uL (ref 15–500)
EOS PCT: 3 %
HCT: 38.7 % (ref 35.0–45.0)
Hemoglobin: 12.9 g/dL (ref 11.7–15.5)
Lymphocytes Relative: 28 %
Lymphs Abs: 1904 cells/uL (ref 850–3900)
MCH: 29.5 pg (ref 27.0–33.0)
MCHC: 33.3 g/dL (ref 32.0–36.0)
MCV: 88.4 fL (ref 80.0–100.0)
MPV: 10.8 fL (ref 7.5–12.5)
Monocytes Absolute: 544 cells/uL (ref 200–950)
Monocytes Relative: 8 %
NEUTROS ABS: 4080 {cells}/uL (ref 1500–7800)
Neutrophils Relative %: 60 %
PLATELETS: 294 10*3/uL (ref 140–400)
RBC: 4.38 MIL/uL (ref 3.80–5.10)
RDW: 12.9 % (ref 11.0–15.0)
WBC: 6.8 10*3/uL (ref 3.8–10.8)

## 2015-09-18 LAB — TSH: TSH: 2.3 m[IU]/L

## 2015-09-18 LAB — MAGNESIUM: MAGNESIUM: 2.1 mg/dL (ref 1.5–2.5)

## 2015-09-18 NOTE — Patient Instructions (Addendum)
MAKE SURE YOU ARE TAKING SYNTHROID ON EMPTY STOMACH WITH WATER, AND NO OTHER MEDICATIONS   HOME CARE INSTRUCTIONS   Do not stand or sit in one position for long periods of time. Do not sit with your legs crossed. Rest with your legs raised during the day.  Your legs have to be higher than your heart so that gravity will force the valves to open, so please really elevate your legs.   Wear elastic stockings or support hose. Do not wear other tight, encircling garments around the legs, pelvis, or waist.  [employer]  has a wide variety of well priced compression stockings. [place], [place] [address] [phone]  Walk as much as possible to increase blood flow.  Raise the foot of your bed at night with 2-inch blocks. SEEK MEDICAL CARE IF:   The skin around your ankle starts to break down.  You have pain, redness, tenderness, or hard swelling developing in your leg over a vein.  You are uncomfortable due to leg pain. Document Released: 02/12/2005 Document Revised: 07/28/2011 Document Reviewed: 07/01/2010 [facility] Patient Information 2014 [place].  Encourage you to get the 3D Mammogram  The 3D Mammogram is much more specific and sensitive to pick up breast cancer. For women with fibrocystic breast or lumpy breast it can be hard to determine if it is cancer or not but the 3D mammogram is able to tell this difference which cuts back on unneeded additional tests or scary call backs.   - over 40% increase in detection of breast cancer - over 40% reduction in false positives.  - fewer call backs - reduced anxiety - improved outcomes - [facility]  Preventive Care for Adults A healthy lifestyle and preventive care can promote health and wellness. Preventive health guidelines for women include the following key practices.  A routine yearly physical is a good way to check with your health care provider about your health and preventive screening. It is a  chance to share any concerns and updates on your health and to receive a thorough exam.  Visit your dentist for a routine exam and preventive care every 6 months. Brush your teeth twice a day and floss once a day. Good oral hygiene prevents tooth decay and gum disease.  The frequency of eye exams is based on your age, health, family medical history, use of contact lenses, and other factors. Follow your health care provider's recommendations for frequency of eye exams.  Eat a healthy diet. Foods like vegetables, fruits, whole grains, low-fat dairy products, and lean protein foods contain the nutrients you need without too many calories. Decrease your intake of foods high in solid fats, added sugars, and salt. Eat the right amount of calories for you.Get information about a proper diet from your health care provider, if necessary.  Regular physical exercise is one of the most important things you can do for your health. Most adults should get at least 150 minutes of moderate-intensity exercise (any activity that increases your heart rate and causes you to sweat) each week. In addition, most adults need muscle-strengthening exercises on 2 or more days a week.  Maintain a healthy weight. The body mass index (BMI) is a screening tool to identify possible weight problems. It provides an estimate of body fat based on height and weight. Your health care provider can find your BMI and can help you achieve or maintain a healthy weight.For adults 20 years and older:  A BMI  below 18.5 is considered underweight.  A BMI of 18.5 to 24.9 is normal.  A BMI of 25 to 29.9 is considered overweight.  A BMI of 30 and above is considered obese.  Maintain normal blood lipids and cholesterol levels by exercising and minimizing your intake of saturated fat. Eat a balanced diet with plenty of fruit and vegetables. Blood tests for lipids and cholesterol should begin at age 6 and be repeated every 5 years. If your lipid  or cholesterol levels are high, you are over 50, or you are at high risk for heart disease, you may need your cholesterol levels checked more frequently.Ongoing high lipid and cholesterol levels should be treated with medicines if diet and exercise are not working.  If you smoke, find out from your health care provider how to quit. If you do not use tobacco, do not start.  Lung cancer screening is recommended for adults aged 47-80 years who are at high risk for developing lung cancer because of a history of smoking. A yearly low-dose CT scan of the lungs is recommended for people who have at least a 30-pack-year history of smoking and are a current smoker or have quit within the past 15 years. A pack year of smoking is smoking an average of 1 pack of cigarettes a day for 1 year (for example: 1 pack a day for 30 years or 2 packs a day for 15 years). Yearly screening should continue until the smoker has stopped smoking for at least 15 years. Yearly screening should be stopped for people who develop a health problem that would prevent them from having lung cancer treatment.  If you are pregnant, do not drink alcohol. If you are breastfeeding, be very cautious about drinking alcohol. If you are not pregnant and choose to drink alcohol, do not have more than 1 drink per day. One drink is considered to be 12 ounces (355 mL) of beer, 5 ounces (148 mL) of wine, or 1.5 ounces (44 mL) of liquor.  Avoid use of street drugs. Do not share needles with anyone. Ask for help if you need support or instructions about stopping the use of drugs.  High blood pressure causes heart disease and increases the risk of stroke. Your blood pressure should be checked at least every 1 to 2 years. Ongoing high blood pressure should be treated with medicines if weight loss and exercise do not work.  If you are 78-3 years old, ask your health care provider if you should take aspirin to prevent strokes.  Diabetes screening involves  taking a blood sample to check your fasting blood sugar level. This should be done once every 3 years, after age 46, if you are within normal weight and without risk factors for diabetes. Testing should be considered at a younger age or be carried out more frequently if you are overweight and have at least 1 risk factor for diabetes.  Breast cancer screening is essential preventive care for women. You should practice "breast self-awareness." This means understanding the normal appearance and feel of your breasts and may include breast self-examination. Any changes detected, no matter how small, should be reported to a health care provider. Women in their 62s and 30s should have a clinical breast exam (CBE) by a health care provider as part of a regular health exam every 1 to 3 years. After age 11, women should have a CBE every year. Starting at age 70, women should consider having a mammogram (breast X-ray test) every  year. Women who have a family history of breast cancer should talk to their health care provider about genetic screening. Women at a high risk of breast cancer should talk to their health care providers about having an MRI and a mammogram every year.  Breast cancer gene (BRCA)-related cancer risk assessment is recommended for women who have family members with BRCA-related cancers. BRCA-related cancers include breast, ovarian, tubal, and peritoneal cancers. Having family members with these cancers may be associated with an increased risk for harmful changes (mutations) in the breast cancer genes BRCA1 and BRCA2. Results of the assessment will determine the need for genetic counseling and BRCA1 and BRCA2 testing.  Routine pelvic exams to screen for cancer are no longer recommended for nonpregnant women who are considered low risk for cancer of the pelvic organs (ovaries, uterus, and vagina) and who do not have symptoms. Ask your health care provider if a screening pelvic exam is right for  you.  If you have had past treatment for cervical cancer or a condition that could lead to cancer, you need Pap tests and screening for cancer for at least 20 years after your treatment. If Pap tests have been discontinued, your risk factors (such as having a new sexual partner) need to be reassessed to determine if screening should be resumed. Some women have medical problems that increase the chance of getting cervical cancer. In these cases, your health care provider may recommend more frequent screening and Pap tests.  The HPV test is an additional test that may be used for cervical cancer screening. The HPV test looks for the virus that can cause the cell changes on the cervix. The cells collected during the Pap test can be tested for HPV. The HPV test could be used to screen women aged 6 years and older, and should be used in women of any age who have unclear Pap test results. After the age of 28, women should have HPV testing at the same frequency as a Pap test.  Colorectal cancer can be detected and often prevented. Most routine colorectal cancer screening begins at the age of 6 years and continues through age 66 years. However, your health care provider may recommend screening at an earlier age if you have risk factors for colon cancer. On a yearly basis, your health care provider may provide home test kits to check for hidden blood in the stool. Use of a small camera at the end of a tube, to directly examine the colon (sigmoidoscopy or colonoscopy), can detect the earliest forms of colorectal cancer. Talk to your health care provider about this at age 49, when routine screening begins. Direct exam of the colon should be repeated every 5-10 years through age 19 years, unless early forms of pre-cancerous polyps or small growths are found.  People who are at an increased risk for hepatitis B should be screened for this virus. You are considered at high risk for hepatitis B if:  You were born in a  country where hepatitis B occurs often. Talk with your health care provider about which countries are considered high risk.  Your parents were born in a high-risk country and you have not received a shot to protect against hepatitis B (hepatitis B vaccine).  You have HIV or AIDS.  You use needles to inject street drugs.  You live with, or have sex with, someone who has hepatitis B.  You get hemodialysis treatment.  You take certain medicines for conditions like cancer, organ transplantation,  and autoimmune conditions.  Hepatitis C blood testing is recommended for all people born from [birth year] through 1963 and any individual with known risks for hepatitis C.  Practice safe sex. Use condoms and avoid high-risk sexual practices to reduce the spread of sexually transmitted infections (STIs). STIs include gonorrhea, chlamydia, syphilis, trichomonas, herpes, HPV, and human immunodeficiency virus (HIV). Herpes, HIV, and HPV are viral illnesses that have no cure. They can result in disability, cancer, and death.  You should be screened for sexually transmitted illnesses (STIs) including gonorrhea and chlamydia if:  You are sexually active and are younger than 24 years.  You are older than 24 years and your health care provider tells you that you are at risk for this type of infection.  Your sexual activity has changed since you were last screened and you are at an increased risk for chlamydia or gonorrhea. Ask your health care provider if you are at risk.  If you are at risk of being infected with HIV, it is recommended that you take a prescription medicine daily to prevent HIV infection. This is called preexposure prophylaxis (PrEP). You are considered at risk if:  You are a heterosexual woman, are sexually active, and are at increased risk for HIV infection.  You take drugs by injection.  You are sexually active with a partner who has HIV.  Talk with your health care provider about whether  you are at high risk of being infected with HIV. If you choose to begin PrEP, you should first be tested for HIV. You should then be tested every 3 months for as long as you are taking PrEP.  Osteoporosis is a disease in which the bones lose minerals and strength with aging. This can result in serious bone fractures or breaks. The risk of osteoporosis can be identified using a bone density scan. Women ages 52 years and over and women at risk for fractures or osteoporosis should discuss screening with their health care providers. Ask your health care provider whether you should take a calcium supplement or vitamin D to reduce the rate of osteoporosis.  Menopause can be associated with physical symptoms and risks. Hormone replacement therapy is available to decrease symptoms and risks. You should talk to your health care provider about whether hormone replacement therapy is right for you.  Use sunscreen. Apply sunscreen liberally and repeatedly throughout the day. You should seek shade when your shadow is shorter than you. Protect yourself by wearing long sleeves, pants, a wide-brimmed hat, and sunglasses year round, whenever you are outdoors.  Once a month, do a whole body skin exam, using a mirror to look at the skin on your back. Tell your health care provider of new moles, moles that have irregular borders, moles that are larger than a pencil eraser, or moles that have changed in shape or color.  Stay current with required vaccines (immunizations).  Influenza vaccine. All adults should be immunized every year.  Tetanus, diphtheria, and acellular pertussis (Td, Tdap) vaccine. Pregnant women should receive 1 dose of Tdap vaccine during each pregnancy. The dose should be obtained regardless of the length of time since the last dose. Immunization is preferred during the 27th-36th week of gestation. An adult who has not previously received Tdap or who does not know her vaccine status should receive 1 dose  of Tdap. This initial dose should be followed by tetanus and diphtheria toxoids (Td) booster doses every 10 years. Adults with an unknown or incomplete history of completing a  3-dose immunization series with Td-containing vaccines should begin or complete a primary immunization series including a Tdap dose. Adults should receive a Td booster every 10 years.  Varicella vaccine. An adult without evidence of immunity to varicella should receive 2 doses or a second dose if she has previously received 1 dose. Pregnant females who do not have evidence of immunity should receive the first dose after pregnancy. This first dose should be obtained before leaving the health care facility. The second dose should be obtained 4-8 weeks after the first dose.  Human papillomavirus (HPV) vaccine. Females aged 13-26 years who have not received the vaccine previously should obtain the 3-dose series. The vaccine is not recommended for use in pregnant females. However, pregnancy testing is not needed before receiving a dose. If a female is found to be pregnant after receiving a dose, no treatment is needed. In that case, the remaining doses should be delayed until after the pregnancy. Immunization is recommended for any person with an immunocompromised condition through the age of 26 years if she did not get any or all doses earlier. During the 3-dose series, the second dose should be obtained 4-8 weeks after the first dose. The third dose should be obtained 24 weeks after the first dose and 16 weeks after the second dose.  Zoster vaccine. One dose is recommended for adults aged 61 years or older unless certain conditions are present.  Measles, mumps, and rubella (MMR) vaccine. Adults born before [birth year] generally are considered immune to measles and mumps. Adults born in [birth year] or later should have 1 or more doses of MMR vaccine unless there is a contraindication to the vaccine or there is laboratory evidence of immunity to each of  the three diseases. A routine second dose of MMR vaccine should be obtained at least 28 days after the first dose for students attending postsecondary schools, health care workers, or international travelers. People who received inactivated measles vaccine or an unknown type of measles vaccine during 1963-1967 should receive 2 doses of MMR vaccine. People who received inactivated mumps vaccine or an unknown type of mumps vaccine before 1979 and are at high risk for mumps infection should consider immunization with 2 doses of MMR vaccine. For females of childbearing age, rubella immunity should be determined. If there is no evidence of immunity, females who are not pregnant should be vaccinated. If there is no evidence of immunity, females who are pregnant should delay immunization until after pregnancy. Unvaccinated health care workers born before [birth year] who lack laboratory evidence of measles, mumps, or rubella immunity or laboratory confirmation of disease should consider measles and mumps immunization with 2 doses of MMR vaccine or rubella immunization with 1 dose of MMR vaccine.  Pneumococcal 13-valent conjugate (PCV13) vaccine. When indicated, a person who is uncertain of her immunization history and has no record of immunization should receive the PCV13 vaccine. An adult aged 55 years or older who has certain medical conditions and has not been previously immunized should receive 1 dose of PCV13 vaccine. This PCV13 should be followed with a dose of pneumococcal polysaccharide (PPSV23) vaccine. The PPSV23 vaccine dose should be obtained at least 8 weeks after the dose of PCV13 vaccine. An adult aged 57 years or older who has certain medical conditions and previously received 1 or more doses of PPSV23 vaccine should receive 1 dose of PCV13. The PCV13 vaccine dose should be obtained 1 or more years after the last PPSV23 vaccine dose.  Pneumococcal polysaccharide (PPSV23) vaccine.  When PCV13 is also indicated,  PCV13 should be obtained first. All adults aged 60 years and older should be immunized. An adult younger than age 9 years who has certain medical conditions should be immunized. Any person who resides in a nursing home or long-term care facility should be immunized. An adult smoker should be immunized. People with an immunocompromised condition and certain other conditions should receive both PCV13 and PPSV23 vaccines. People with human immunodeficiency virus (HIV) infection should be immunized as soon as possible after diagnosis. Immunization during chemotherapy or radiation therapy should be avoided. Routine use of PPSV23 vaccine is not recommended for American Indians, [place]atives, or people younger than 65 years unless there are medical conditions that require PPSV23 vaccine. When indicated, people who have unknown immunization and have no record of immunization should receive PPSV23 vaccine. One-time revaccination 5 years after the first dose of PPSV23 is recommended for people aged 19-64 years who have chronic kidney failure, nephrotic syndrome, asplenia, or immunocompromised conditions. People who received 1-2 doses of PPSV23 before age 24 years should receive another dose of PPSV23 vaccine at age 51 years or later if at least 5 years have passed since the previous dose. Doses of PPSV23 are not needed for people immunized with PPSV23 at or after age 57 years.  Meningococcal vaccine. Adults with asplenia or persistent complement component deficiencies should receive 2 doses of quadrivalent meningococcal conjugate (MenACWY-D) vaccine. The doses should be obtained at least 2 months apart. Microbiologists working with certain meningococcal bacteria, [employer] recruits, people at risk during an outbreak, and people who travel to or live in countries with a high rate of meningitis should be immunized. A first-year college student up through age 63 years who is living in a residence hall should receive a dose  if she did not receive a dose on or after her 16th birthday. Adults who have certain high-risk conditions should receive one or more doses of vaccine.  Hepatitis A vaccine. Adults who wish to be protected from this disease, have certain high-risk conditions, work with hepatitis A-infected animals, work in hepatitis A research labs, or travel to or work in countries with a high rate of hepatitis A should be immunized. Adults who were previously unvaccinated and who anticipate close contact with an international adoptee during the first 60 days after arrival in the [place] from a country with a high rate of hepatitis A should be immunized.  Hepatitis B vaccine. Adults who wish to be protected from this disease, have certain high-risk conditions, may be exposed to blood or other infectious body fluids, are household contacts or sex partners of hepatitis B positive people, are clients or workers in certain care facilities, or travel to or work in countries with a high rate of hepatitis B should be immunized.  Haemophilus influenzae type b (Hib) vaccine. A previously unvaccinated person with asplenia or sickle cell disease or having a scheduled splenectomy should receive 1 dose of Hib vaccine. Regardless of previous immunization, a recipient of a hematopoietic stem cell transplant should receive a 3-dose series 6-12 months after her successful transplant. Hib vaccine is not recommended for adults with HIV infection. Preventive Services / Frequency  Ages 60 to 42 years 1. Blood pressure check. 2. Lipid and cholesterol check. 3. Clinical breast exam.** / Every 3 years for women in their 85s and 7s. 4. BRCA-related cancer risk assessment.** / For women who have family members with a BRCA-related cancer (breast, ovarian, tubal, or peritoneal cancers). 5. Pap  test.** / Every 2 years from ages 84 through 72. Every 3 years starting at age 70 through age 26 or 3 with a history of 3 consecutive normal Pap  tests. 6. HPV screening.** / Every 3 years from ages 20 through ages 68 to 90 with a history of 3 consecutive normal Pap tests. 7. Hepatitis C blood test.** / For any individual with known risks for hepatitis C. 8. Skin self-exam. / Monthly. 9. Influenza vaccine. / Every year. 10. Tetanus, diphtheria, and acellular pertussis (Tdap, Td) vaccine.** / Consult your health care provider. Pregnant women should receive 1 dose of Tdap vaccine during each pregnancy. 1 dose of Td every 10 years. 11. Varicella vaccine.** / Consult your health care provider. Pregnant females who do not have evidence of immunity should receive the first dose after pregnancy. 12. HPV vaccine. / 3 doses over 6 months, if 24 and younger. The vaccine is not recommended for use in pregnant females. However, pregnancy testing is not needed before receiving a dose. 13. Measles, mumps, rubella (MMR) vaccine.** / You need at least 1 dose of MMR if you were born in 1957 or later. You may also need a 2nd dose. For females of childbearing age, rubella immunity should be determined. If there is no evidence of immunity, females who are not pregnant should be vaccinated. If there is no evidence of immunity, females who are pregnant should delay immunization until after pregnancy. 14. Pneumococcal 13-valent conjugate (PCV13) vaccine.** / Consult your health care provider. 15. Pneumococcal polysaccharide (PPSV23) vaccine.** / 1 to 2 doses if you smoke cigarettes or if you have certain conditions. 16. Meningococcal vaccine.** / 1 dose if you are age 13 to 67 years and a Market researcher living in a residence hall, or have one of several medical conditions, you need to get vaccinated against meningococcal disease. You may also need additional booster doses. 17. Hepatitis A vaccine.** / Consult your health care provider. 18. Hepatitis B vaccine.** / Consult your health care provider. 19. Haemophilus influenzae type b (Hib) vaccine.** /  Consult your health care provider.  62. Chlamydia, HIV, and other sexually transmitted diseases- Get screened every year until age 11, then within three months of each new sexual provider. 21. Pap Smear- Every 1-3 years; discuss with your health care provider. 28. Mammogram- Every year starting at age 51  Take these steps 1. Do not smoke-Your healthcare provider can help you quit.  For tips on how to quit go to www.smokefree.gov or call 1-800 QUITNOW. 2. Be physically active- Exercise 5 days a week for at least 30 minutes.  If you are not already physically active, start slow and gradually work up to 30 minutes of moderate physical activity.  Examples of moderate activity include walking briskly, dancing, swimming, bicycling, etc. 3. Breast Cancer- A self breast exam every month is important for early detection of breast cancer.  For more information and instruction on self breast exams, ask your healthcare provider or https://www.patel.info/. 4. Eat a healthy diet- Eat a variety of healthy foods such as fruits, vegetables, whole grains, low fat milk, low fat cheeses, yogurt, lean meats, poultry and fish, beans, nuts, tofu, etc.  For more information go to www. Thenutritionsource.org 5. Drink alcohol in moderation- Limit alcohol intake to one drink or less per day. Never drink and drive. 6. Depression- Your emotional health is as important as your physical health.  If you're feeling down or losing interest in things you normally enjoy please talk to your  healthcare provider about being screened for depression. 7. Dental visit- Brush and floss your teeth twice daily; visit your dentist twice a year. 8. Eye doctor- Get an eye exam at least every 2 years. 9. Helmet use- Always wear a helmet when riding a bicycle, motorcycle, rollerblading or skateboarding. 42. Safe sex- If you may be exposed to sexually transmitted infections, use a condom. 11. Seat belts- Seat belts can save your  live; always wear one. 12. Smoke/Carbon Monoxide detectors- These detectors need to be installed on the appropriate level of your home. Replace batteries at least once a year. 13. Skin cancer- When out in the sun please cover up and use sunscreen 15 SPF or higher. 14. Violence- If anyone is threatening or hurting you, please tell your healthcare provider.

## 2015-09-18 NOTE — Progress Notes (Signed)
Complete Physical  Assessment and Plan: 1. Thyroid disease check TSH level, continue medications the same, reminded to take on an empty stomach 30-77mins before food.  - BASIC METABOLIC PANEL WITH GFR - Hepatic function panel - TSH  2. Anemia, unspecified anemia type - CBC with Differential/Platelet  3. Vitamin D deficiency - Vit D  25 hydroxy (rtn osteoporosis monitoring)  4. Interstitial cystitis Continue amitriptyline and avoid triggers - Microalbumin / creatinine urine ratio - Urinalysis, Routine w reflex microscopic  5. Generalized anxiety disorder continue medications, stress management techniques discussed, increase water, good sleep hygiene discussed, increase exercise, and increase veggies.   6. Gastroesophageal reflux disease with esophagitis Continue PPI/H2 blocker, diet discussed  7.  Medication management - Magnesium  Discussed med's effects and SE's. Screening labs and tests as requested with regular follow-up as recommended. Over 40 minutes of exam, counseling, chart review, and critical decision making was performed this visit.   HPI  45 y.o. female  presents for a complete physical.  Her blood pressure has been controlled at home, today their BP is BP: 118/76 mmHg She does workout. She denies chest pain, shortness of breath, dizziness.  She is not on cholesterol medication and denies myalgias. Her cholesterol is at goal. The cholesterol last visit was:   Lab Results  Component Value Date   CHOL 210* 08/15/2014   HDL 77 08/15/2014   LDLCALC 119* 08/15/2014   TRIG 72 08/15/2014   CHOLHDL 2.7 08/15/2014   Last A1C in the office was:   Lab Results  Component Value Date   HGBA1C 5.6 08/15/2014   She is following with her OB/GYN, Dr. Melba Coon, for ICS and is on amitriptyline, will take 25 mg nightly and will do 75 mg very rare for flares.  Patient is not on Vitamin D supplement Lab Results  Component Value Date   VD25OH 30 02/23/2015   She is not on  prilosec due to new info, she take rolaids, still has some GERD. Does take Iburprofen. 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 May train for half marathon in [facility] Nov.  She is on thyroid medication. Her medication was changed last visit.   Lab Results  Component Value Date   TSH 6.019* 02/23/2015  BMI is Body mass index is 27.28 kg/(m^2)., she is working on diet and exercise. Wt Readings from Last 3 Encounters:  09/18/15 184 lb 12.8 oz (83.825 kg)  02/23/15 187 lb (84.823 kg)  08/15/14 187 lb (84.823 kg)     Current Medications:  Current Outpatient Prescriptions on File Prior to Visit  Medication Sig Dispense Refill  . ALPRAZolam (XANAX) 0.5 MG tablet TAKE 1 TABLET BY MOUTH 3 TIMES DAILY AS NEEDED FOR ANXIETY 90 tablet 0  . amitriptyline (ELAVIL) 25 MG tablet Take 1 tablet (25 mg total) by mouth 3 (three) times daily as needed for sleep. 90 tablet 4  . escitalopram (LEXAPRO) 10 MG tablet Take 1 tablet (10 mg total) by mouth daily. 90 tablet 4  . levothyroxine (SYNTHROID, LEVOTHROID) 112 MCG tablet Take 1 tablet (112 mcg total) by mouth daily before breakfast. 90 tablet 4   No current facility-administered medications on file prior to visit.   Health Maintenance:    Immunization History  Administered Date(s) Administered  . 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 Pap: Dr. Melba Coon, 09/2013 MGM: 09/2013, getting every 2 years DEXA: N/A Colonoscopy:N/A EGD: N/A CXR 08/2013 Last Dental Exam: [place] smiles Last Eye Exam: Dr. Alois Cliche  Patient Care Team: Unk Pinto, MD as PCP - General (Internal Medicine)  Allergies:  Allergies  Allergen Reactions  . Amoxicillin Rash and Other (See Comments)    FEVER   Medical History:  Past Medical History   Diagnosis Date  . GERD (gastroesophageal reflux disease)   . Hypothyroidism   . Interstitial cystitis   . Anxiety   . Breast hypertrophy 03/2014   Surgical History:  Past Surgical History  Procedure Laterality Date  . No past surgeries    . Breast reduction surgery Bilateral 04/18/2014    Procedure: MAMMARY REDUCTION  (BREAST) BILATERAL;  Surgeon: Charlene Brooke, MD;  Location: [facility];  Service: Plastics;  Laterality: Bilateral;   Family History: No family history on file. Social History:  Social History  Substance Use Topics  . Smoking status: Former Smoker    Quit date: 05/18/2012  . Smokeless tobacco: Never Used  . Alcohol Use: Yes     Comment: weekends   Review of Systems: Review of Systems  Constitutional: Negative.   HENT: Negative.   Eyes: Negative.   Respiratory: Negative.   Cardiovascular: Negative.   Gastrointestinal: Positive for heartburn. Negative for nausea, vomiting, abdominal pain, diarrhea, constipation, blood in stool and melena.  Genitourinary: Positive for urgency and frequency. Negative for dysuria, hematuria and flank pain.  Musculoskeletal: Negative.   Skin: Negative.   Neurological: Negative.   Psychiatric/Behavioral: Negative.     Physical Exam: Estimated body mass index is 27.28 kg/(m^2) as calculated from the following:   Height as of this encounter: 5\' 9"  (1.753 m).   Weight as of this encounter: 184 lb 12.8 oz (83.825 kg). BP 118/76 mmHg  Pulse 99  Temp(Src) 97.7 F (36.5 C) (Temporal)  Resp 16  Ht 5\' 9"  (1.753 m)  Wt 184 lb 12.8 oz (83.825 kg)  BMI 27.28 kg/m2  SpO2 96%  LMP 09/15/2015  Wt Readings from Last 3 Encounters:  09/18/15 184 lb 12.8 oz (83.825 kg)  02/23/15 187 lb (84.823 kg)  08/15/14 187 lb (84.823 kg)   General Appearance: Well nourished, in no apparent distress.  Eyes: PERRLA, EOMs, conjunctiva no swelling or erythema, normal fundi and vessels.  Sinuses: No Frontal/maxillary tenderness   ENT/Mouth: Ext aud canals clear, normal light reflex with TMs without erythema, bulging. Good dentition. No erythema, swelling, or exudate on post pharynx. Tonsils not swollen or erythematous. Hearing normal.  Neck: Supple, thyroid normal. No bruits  Respiratory: Respiratory effort normal, BS equal bilaterally without rales, rhonchi, wheezing or stridor.  Cardio: RRR without murmurs, rubs or gallops. Brisk peripheral pulses without edema.  Chest: symmetric, with normal excursions and percussion.  Breasts: defer Abdomen: Soft, nontender, no guarding, rebound, hernias, masses, or organomegaly.  Lymphatics: Non tender without lymphadenopathy.  Genitourinary: defer Musculoskeletal: Full ROM all peripheral extremities,5/5 strength, and normal gait.  Skin: Warm, dry without rashes, lesions, ecchymosis. Neuro: Cranial nerves intact, reflexes equal bilaterally. Normal muscle tone, no cerebellar symptoms. Sensation intact.  Psych: Awake and oriented X 3, normal affect, Insight and Judgment appropriate.   EKG: defer, normal 2015, no SX's   Vicie Mutters 3:06 PM [facility]

## 2015-09-19 LAB — URINALYSIS, ROUTINE W REFLEX MICROSCOPIC
Bilirubin Urine: NEGATIVE
GLUCOSE, UA: NEGATIVE
Hgb urine dipstick: NEGATIVE
Ketones, ur: NEGATIVE
LEUKOCYTES UA: NEGATIVE
Nitrite: NEGATIVE
PROTEIN: NEGATIVE
SPECIFIC GRAVITY, URINE: 1.011 (ref 1.001–1.035)
pH: 7 (ref 5.0–8.0)

## 2015-09-19 LAB — MICROALBUMIN / CREATININE URINE RATIO
CREATININE, URINE: 50 mg/dL (ref 20–320)
Microalb, Ur: <0.2 mg/dL

## 2015-10-01 MED FILL — ESCITALOPRAM 10 MG TABLET: 10 | 90 days supply | Qty: 90 | Fill #2

## 2015-10-01 MED FILL — LEVOTHYROXINE 112 MCG TAB: 112 | 90 days supply | Qty: 90 | Fill #2

## 2015-10-05 ENCOUNTER — Other Ambulatory Visit: Payer: Self-pay | Admitting: Physician Assistant

## 2015-10-05 MED ORDER — CYCLOBENZAPRINE HCL 10 MG PO TABS: 10.0000 mg | ORAL_TABLET | Freq: Three times a day (TID) | ORAL | Status: DC | PRN

## 2015-10-05 MED FILL — CYCLOBENZAPRINE 10 MG TAB: 10 | 20 days supply | Qty: 60 | Fill #0

## 2015-10-11 ENCOUNTER — Encounter: Payer: Self-pay | Admitting: Physician Assistant

## 2015-11-13 MED FILL — AMITRIPTYLINE HCL 25 MG TAB: 25 | 30 days supply | Qty: 90 | Fill #1

## 2015-12-24 ENCOUNTER — Other Ambulatory Visit: Payer: Self-pay | Admitting: Obstetrics and Gynecology

## 2015-12-24 DIAGNOSIS — R928 Other abnormal and inconclusive findings on diagnostic imaging of breast: Secondary | ICD-10-CM

## 2015-12-25 MED FILL — ESCITALOPRAM 10 MG TABLET: 10 | 90 days supply | Qty: 90 | Fill #3

## 2015-12-25 MED FILL — LEVOTHYROXINE 112 MCG TAB: 112 | 90 days supply | Qty: 90 | Fill #3

## 2015-12-26 ENCOUNTER — Other Ambulatory Visit: Payer: Self-pay | Admitting: Physician Assistant

## 2015-12-26 MED ORDER — ALPRAZOLAM 0.5 MG PO TABS: ORAL_TABLET | ORAL | 0 refills | Status: DC

## 2015-12-26 MED FILL — ALPRAZolam 0.5 MG TABS: 0.5 | 30 days supply | Qty: 90 | Fill #0

## 2016-01-02 ENCOUNTER — Other Ambulatory Visit: Payer: Self-pay | Admitting: Physician Assistant

## 2016-01-03 ENCOUNTER — Telehealth: Payer: Self-pay

## 2016-01-03 NOTE — Telephone Encounter (Signed)
Rx  Called into [facility].

## 2016-01-22 MED FILL — AMITRIPTYLINE HCL 25 MG TAB: 25 | 30 days supply | Qty: 90 | Fill #2

## 2016-02-04 ENCOUNTER — Ambulatory Visit
Admission: RE | Admit: 2016-02-04 | Discharge: 2016-02-04 | Disposition: A | Discharge status: Home / Self Care | Payer: 59 | Source: Ambulatory Visit | Attending: Obstetrics and Gynecology | Admitting: Obstetrics and Gynecology

## 2016-02-04 ENCOUNTER — Other Ambulatory Visit: Payer: Self-pay | Admitting: Obstetrics and Gynecology

## 2016-02-04 DIAGNOSIS — R928 Other abnormal and inconclusive findings on diagnostic imaging of breast: Secondary | ICD-10-CM

## 2016-02-04 DIAGNOSIS — N6489 Other specified disorders of breast: Secondary | ICD-10-CM | POA: Diagnosis not present

## 2016-02-26 ENCOUNTER — Ambulatory Visit (INDEPENDENT_AMBULATORY_CARE_PROVIDER_SITE_OTHER): Payer: 59

## 2016-02-26 DIAGNOSIS — Z111 Encounter for screening for respiratory tuberculosis: Secondary | ICD-10-CM | POA: Diagnosis not present

## 2016-02-26 DIAGNOSIS — E039 Hypothyroidism, unspecified: Secondary | ICD-10-CM | POA: Diagnosis not present

## 2016-02-26 DIAGNOSIS — Z23 Encounter for immunization: Secondary | ICD-10-CM

## 2016-02-26 LAB — TSH: TSH: 3.13 mIU/L

## 2016-02-26 NOTE — Progress Notes (Signed)
PT PRESENTS FOR BLOOD DRAW:TSH, TB GOLD & FLU VACCINE. PT TOLERATED VACCINE & THEN WAS DIRECTED TO THE LAB FOR BLOOD DRAW.

## 2016-02-29 LAB — QUANTIFERON TB GOLD ASSAY (BLOOD)
INTERFERON GAMMA RELEASE ASSAY: NEGATIVE
Mitogen-Nil: >10 IU/mL
Quantiferon Nil Value: 0.02 IU/mL
Quantiferon Tb Ag Minus Nil Value: <0 IU/mL

## 2016-03-28 DIAGNOSIS — R05 Cough: Secondary | ICD-10-CM | POA: Diagnosis not present

## 2016-03-28 DIAGNOSIS — J209 Acute bronchitis, unspecified: Secondary | ICD-10-CM | POA: Diagnosis not present

## 2016-04-01 ENCOUNTER — Other Ambulatory Visit: Payer: Self-pay | Admitting: Physician Assistant

## 2016-04-01 MED FILL — ESCITALOPRAM 10 MG TABLET: 10 | 90 days supply | Qty: 90 | Fill #0

## 2016-04-01 MED FILL — AMITRIPTYLINE HCL 25 MG TAB: 25 | 30 days supply | Qty: 90 | Fill #3

## 2016-04-01 MED FILL — LEVOTHYROXINE 112 MCG TAB: 112 | 90 days supply | Qty: 90 | Fill #0

## 2016-04-21 MED FILL — ALPRAZolam 0.5 MG TABS: 0.5 | 30 days supply | Qty: 90 | Fill #0

## 2016-05-29 ENCOUNTER — Encounter: Payer: Self-pay | Admitting: Physician Assistant

## 2016-05-29 ENCOUNTER — Ambulatory Visit (INDEPENDENT_AMBULATORY_CARE_PROVIDER_SITE_OTHER): Payer: 59 | Admitting: Physician Assistant

## 2016-05-29 VITALS — BP 118/74 | HR 87 | Temp 97.7°F | Resp 16 | Wt 195.4 lb

## 2016-05-29 DIAGNOSIS — J358 Other chronic diseases of tonsils and adenoids: Secondary | ICD-10-CM | POA: Diagnosis not present

## 2016-05-29 MED ORDER — DOXYCYCLINE HYCLATE 100 MG PO CAPS: ORAL_CAPSULE | ORAL | 0 refills | Status: DC

## 2016-05-29 MED ORDER — PREDNISONE 20 MG PO TABS: ORAL_TABLET | ORAL | 0 refills | Status: DC

## 2016-05-29 MED FILL — predniSONE 20 MG TABS: 20 | 7 days supply | Qty: 10 | Fill #0

## 2016-05-29 NOTE — Patient Instructions (Addendum)
Do capful of peroxide with mouth wash 2-3 x a day Take very low dose of prednisone, read instructions below Get on allergy pill   Make sure you are on an allergy pill, see below for more details. Please take the prednisone as directed below, this is NOT an antibiotic so you do NOT have to finish it. You can take it for a few days and stop it if you are doing better.   Please take the prednisone to help decrease inflammation and therefore decrease symptoms. Take it it with food to avoid GI upset. It can cause increased energy but on the other hand it can make it hard to sleep at night so please take it AT [place], it takes 8-12 hours to start working so it will NOT affect your sleeping if you take it at night with your food!!  If you are diabetic it will increase your sugars so decrease carbs and monitor your sugars closely.    Please pick one of the over the counter allergy medications below and take it once daily for allergies.  Claritin or loratadine cheapest but likely the weakest  Zyrtec or certizine at night because it can make you sleepy The strongest is allegra or fexafinadine  Cheapest at [employer], [employer], [employer]  If not better can take doxycycline in 1-2 weeks If not better still come back to office in 2-4 weeks  What Causes Tonsil Stones? Your tonsils are filled with nooks and crannies where bacteria and other materials, including dead cells and mucous, can become trapped. When this happens, the debris can become concentrated in white formations that occur in the pockets. Tonsil stones, or tonsilloliths, are formed when this trapped debris hardens, or calcifies. This tends to happen most often in people who have chronic inflammation in their tonsils or repeated bouts of tonsillitis. While many people have small tonsilloliths that develop in their tonsils, it is quite rare to have a large and solidified tonsil stone.  How Are Tonsil Stones Treated? The appropriate treatment  for a tonsil stone depends on the size of the tonsillolith and its potential to cause discomfort or harm. Options include: No treatment. Many tonsil stones, especially ones that have no symptoms, require no special treatment. At-home removal. Some people choose to dislodge tonsil stones at home with the use of picks or swabs. Salt water gargles. Gargling with warm, salty water may help ease the discomfort of tonsillitis, which often accompanies tonsil stones. Allergy pills- Taking allergy pill every day can decrease the inflammation and decrease the formation of tonsil stones Continue reading below...  Antibiotics. Various antibiotics can be used to treat tonsil stones. While they may be helpful for some people, they cannot correct the basic problem that is causing tonsilloliths. Also, antibiotics can have side effects. Surgical removal. When tonsil stones are exceedingly large and symptomatic, it may be necessary for a surgeon to remove them. In certain instances, a doctor will be able to perform this relatively simple procedure using a local numbing agent. Then the patient will not need general anesthesia.  Can Tonsil Stones Be Prevented? Take an antihistamine daily can prevent inflammation and formation.  Since tonsil stones are more common in people who have chronic tonsillitis, the only surefire way to prevent them is with surgical removal of the tonsils. This procedure, known as a tonsillectomy, removes the tissues of the tonsils entirely, thereby eliminating the possibility of tonsillolith formation, however, adults are more likely to have complications from the surgery than  children so it is considered a last resort.

## 2016-05-29 NOTE — Progress Notes (Signed)
   Subjective:    Patient ID: Shari Payne, female    DOB: May 04, 1971, 46 y.o.   MRN: [id number]  HPI 46 y.o. WF presents with throat infection possibly x 1 month. Has had bad, taste and smell left tonsil. No fever, or chills. Had a zpak in Nov for URI.   Blood pressure 118/74, pulse 87, temperature 97.7 F (36.5 C), resp. rate 16, weight 195 lb 6.4 oz (88.6 kg), last menstrual period 05/09/2016, SpO2 98 %.  Medications Current Outpatient Prescriptions on File Prior to Visit  Medication Sig  . ALPRAZolam (XANAX) 0.5 MG tablet TAKE 1 TABLET BY MOUTH 3 TIMES DAILY AS NEEDED FOR ANXIETY  . amitriptyline (ELAVIL) 25 MG tablet Take 1 tablet (25 mg total) by mouth 3 (three) times daily as needed for sleep.  . cyclobenzaprine (FLEXERIL) 10 MG tablet Take 1 tablet (10 mg total) by mouth every 8 (eight) hours as needed for muscle spasms.  Mar[place] Kitchen escitalopram (LEXAPRO) 10 MG tablet TAKE 1 TABLET BY MOUTH ONCE DAILY  . levothyroxine (SYNTHROID, LEVOTHROID) 112 MCG tablet TAKE 1 TABLET BY MOUTH ONCE DAILY BEFORE BREAKFAST.   No current facility-administered medications on file prior to visit.     Problem list She has Anemia; Thyroid disease; Vitamin D deficiency; Interstitial cystitis; Generalized anxiety disorder; and GERD (gastroesophageal reflux disease) on her problem list.   Review of Systems  Constitutional: Negative for chills and diaphoresis.  HENT: Positive for sore throat. Negative for congestion, ear pain, sinus pressure and sneezing.   Respiratory: Negative.  Negative for cough and shortness of breath.   Cardiovascular: Negative.   Musculoskeletal: Positive for neck pain.  Neurological: Negative.  Negative for headaches.       Objective:   Physical Exam  Constitutional: She appears well-developed and well-nourished.  HENT:  Head: Normocephalic and atraumatic.  Right Ear: External ear normal.  Left Ear: External ear normal.  Mouth/Throat: Uvula is midline and mucous membranes  are normal. No trismus in the jaw. No dental abscesses. No oropharyngeal exudate, posterior oropharyngeal edema, posterior oropharyngeal erythema or tonsillar abscesses.    Eyes: Conjunctivae and EOM are normal. Pupils are equal, round, and reactive to light.  Neck: Normal range of motion. Neck supple.  Cardiovascular: Normal rate, regular rhythm and normal heart sounds.   Pulmonary/Chest: Effort normal and breath sounds normal.  Abdominal: Soft. Bowel sounds are normal.  Lymphadenopathy:    She has no cervical adenopathy.  Skin: Skin is warm and dry.      Assessment & Plan:  1. Tonsil stone If not better follow up 2-4 weeks.  - doxycycline (VIBRAMYCIN) 100 MG capsule; Take 1 capsule twice daily with food  Dispense: 20 capsule; Refill: 0 - predniSONE (DELTASONE) 20 MG tablet; 2 tablets daily for 3 days, 1 tablet daily for 4 days.  Dispense: 10 tablet; Refill: 0   Future Appointments Date Time Provider [place]  09/24/2016 2:00 PM Vicie Mutters, PA-C [id number] None

## 2016-06-12 MED FILL — DOXYCYCLINE HYCLATE 100 MG: 100 | 10 days supply | Qty: 20 | Fill #0

## 2016-06-16 MED FILL — AMITRIPTYLINE HCL 25 MG TAB: 25 | 30 days supply | Qty: 90 | Fill #4

## 2016-06-30 ENCOUNTER — Encounter: Payer: Self-pay | Admitting: Physician Assistant

## 2016-07-04 DIAGNOSIS — B029 Zoster without complications: Secondary | ICD-10-CM | POA: Diagnosis not present

## 2016-07-17 MED FILL — ESCITALOPRAM 10 MG TABLET: 10 | 90 days supply | Qty: 90 | Fill #1

## 2016-07-17 MED FILL — LEVOTHYROXINE 112 MCG TAB: 112 | 90 days supply | Qty: 90 | Fill #1

## 2016-08-05 ENCOUNTER — Other Ambulatory Visit: Payer: Self-pay | Admitting: Physician Assistant

## 2016-08-05 MED ORDER — ALPRAZOLAM 0.5 MG PO TABS: 0.5000 mg | ORAL_TABLET | Freq: Three times a day (TID) | ORAL | 1 refills | Status: DC | PRN

## 2016-08-05 MED FILL — AMITRIPTYLINE HCL 25 MG TAB: 25 | 90 days supply | Qty: 270 | Fill #0

## 2016-08-06 MED FILL — ALPRAZolam 0.5 MG TABS: 0.5 | 30 days supply | Qty: 90 | Fill #0

## 2016-08-06 NOTE — Progress Notes (Signed)
Xanax was called into pharmacy on 21st March 2018 @ 8:09am by DD

## 2016-08-19 DIAGNOSIS — M5441 Lumbago with sciatica, right side: Secondary | ICD-10-CM | POA: Diagnosis not present

## 2016-08-19 DIAGNOSIS — M9903 Segmental and somatic dysfunction of lumbar region: Secondary | ICD-10-CM | POA: Diagnosis not present

## 2016-08-19 DIAGNOSIS — M5136 Other intervertebral disc degeneration, lumbar region: Secondary | ICD-10-CM | POA: Diagnosis not present

## 2016-08-19 DIAGNOSIS — M9905 Segmental and somatic dysfunction of pelvic region: Secondary | ICD-10-CM | POA: Diagnosis not present

## 2016-08-19 DIAGNOSIS — M62830 Muscle spasm of back: Secondary | ICD-10-CM | POA: Diagnosis not present

## 2016-08-19 DIAGNOSIS — M5386 Other specified dorsopathies, lumbar region: Secondary | ICD-10-CM | POA: Diagnosis not present

## 2016-08-19 DIAGNOSIS — M9902 Segmental and somatic dysfunction of thoracic region: Secondary | ICD-10-CM | POA: Diagnosis not present

## 2016-08-20 DIAGNOSIS — M9903 Segmental and somatic dysfunction of lumbar region: Secondary | ICD-10-CM | POA: Diagnosis not present

## 2016-08-20 DIAGNOSIS — M9905 Segmental and somatic dysfunction of pelvic region: Secondary | ICD-10-CM | POA: Diagnosis not present

## 2016-08-20 DIAGNOSIS — M9902 Segmental and somatic dysfunction of thoracic region: Secondary | ICD-10-CM | POA: Diagnosis not present

## 2016-08-20 DIAGNOSIS — M5386 Other specified dorsopathies, lumbar region: Secondary | ICD-10-CM | POA: Diagnosis not present

## 2016-08-20 DIAGNOSIS — M62830 Muscle spasm of back: Secondary | ICD-10-CM | POA: Diagnosis not present

## 2016-08-20 DIAGNOSIS — M5441 Lumbago with sciatica, right side: Secondary | ICD-10-CM | POA: Diagnosis not present

## 2016-08-20 DIAGNOSIS — M5136 Other intervertebral disc degeneration, lumbar region: Secondary | ICD-10-CM | POA: Diagnosis not present

## 2016-08-26 DIAGNOSIS — M5136 Other intervertebral disc degeneration, lumbar region: Secondary | ICD-10-CM | POA: Diagnosis not present

## 2016-08-26 DIAGNOSIS — M62830 Muscle spasm of back: Secondary | ICD-10-CM | POA: Diagnosis not present

## 2016-08-26 DIAGNOSIS — M9902 Segmental and somatic dysfunction of thoracic region: Secondary | ICD-10-CM | POA: Diagnosis not present

## 2016-08-26 DIAGNOSIS — M9903 Segmental and somatic dysfunction of lumbar region: Secondary | ICD-10-CM | POA: Diagnosis not present

## 2016-08-26 DIAGNOSIS — M5386 Other specified dorsopathies, lumbar region: Secondary | ICD-10-CM | POA: Diagnosis not present

## 2016-08-26 DIAGNOSIS — M9905 Segmental and somatic dysfunction of pelvic region: Secondary | ICD-10-CM | POA: Diagnosis not present

## 2016-08-26 DIAGNOSIS — M5441 Lumbago with sciatica, right side: Secondary | ICD-10-CM | POA: Diagnosis not present

## 2016-08-28 ENCOUNTER — Encounter: Payer: Self-pay | Admitting: Physician Assistant

## 2016-08-29 MED ORDER — PREDNISONE 20 MG PO TABS: ORAL_TABLET | ORAL | 0 refills | Status: AC

## 2016-08-29 MED FILL — predniSONE 20 MG TABS: 20 | 11 days supply | Qty: 20 | Fill #0

## 2016-09-01 ENCOUNTER — Encounter: Payer: Self-pay | Admitting: Physician Assistant

## 2016-09-24 ENCOUNTER — Encounter: Payer: Self-pay | Admitting: Physician Assistant

## 2016-10-02 NOTE — Progress Notes (Signed)
Complete Physical  Assessment and Plan:  Encounter for general adult medical examination with abnormal findings  Gastroesophageal reflux disease with esophagitis Continue PPI/H2 blocker, diet discussed  Thyroid disease Hypothyroidism-check TSH level, continue medications the same, reminded to take on an empty stomach 30-67mins before food.  -     TSH  Interstitial cystitis Avoid triggers  Anemia, unspecified type - monitor, continue iron supp with Vitamin C and increase green leafy veggies -     CBC with Differential/Platelet -     Iron and TIBC  Vitamin D deficiency -     VITAMIN D 25 Hydroxy (Vit-D Deficiency, Fractures)  Generalized anxiety disorder continue medications, stress management techniques discussed, increase water, good sleep hygiene discussed, increase exercise, and increase veggies.   Medication management -     BASIC METABOLIC PANEL WITH GFR -     Hepatic function panel -     Magnesium  Screening cholesterol level -     Lipid panel  Screening for blood or protein in urine -     Urinalysis, Routine w reflex microscopic -     Microalbumin / creatinine urine ratio  Lumbar back pain with radiculopathy affecting right lower extremity + straight leg raise bilateral, still good sensation/strength bilateral legs, no red flag symptoms -     Ambulatory referral to Orthopedics -     cyclobenzaprine (FLEXERIL) 10 MG tablet; Take 1 tablet (10 mg total) by mouth every 8 (eight) hours as needed for muscle spasms.    Discussed med's effects and SE's. Screening labs and tests as requested with regular follow-up as recommended. Over 40 minutes of exam, counseling, chart review, and critical decision making was performed this visit.   HPI  46 y.o. female  presents for a complete physical.  Her blood pressure has been controlled at home, today their BP is BP: 120/86 She does workout. She denies chest pain, shortness of breath, dizziness.  She is not on cholesterol  medication and denies myalgias. Her cholesterol is at goal. The cholesterol last visit was:   Lab Results  Component Value Date   CHOL 183 09/18/2015   HDL 72 09/18/2015   LDLCALC 97 09/18/2015   TRIG 70 09/18/2015   CHOLHDL 2.5 09/18/2015   Last A1C in the office was:   Lab Results  Component Value Date   HGBA1C 5.6 08/15/2014   She is following with her OB/GYN, Dr. Melba Coon, for ICS and is on amitriptyline, will take 25 mg nightly and will do 75 mg very rare for flares.  Patient is not on Vitamin D supplement Lab Results  Component Value Date   VD25OH 30 02/23/2015   She is not on prilosec due to new info, she take rolaids, still has some GERD. Does take Iburprofen. 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 Has seen chiropractor and done prednisone pak. Out of flexeril. Right lower back, right leg pain worse with straightening her leg, putting pants in the morning, has been doing stretches/piriformis that is not helping. Patient denies fever, hematuria, incontinence, numbness, tingling, weakness and saddle anesthesia She is on thyroid medication. Her medication was changed last visit.   Lab Results  Component Value Date   TSH 3.13 02/26/2016  BMI is Body mass index is 30.25 kg/m., she is working on diet and exercise. Wt Readings from Last 3 Encounters:  10/03/16 207 lb 12.8 oz (94.3 kg)  05/29/16 195 lb  6.4 oz (88.6 kg)  09/18/15 184 lb 12.8 oz (83.8 kg)     Current Medications:  Current Outpatient Prescriptions on File Prior to Visit  Medication Sig Dispense Refill  . ALPRAZolam (XANAX) 0.5 MG tablet Take 1 tablet (0.5 mg total) by mouth 3 (three) times daily as needed for anxiety. 90 tablet 1  . amitriptyline (ELAVIL) 25 MG tablet TAKE 1 TABLET BY MOUTH 3 TIMES DAILY AS NEEDED 270 tablet 0  . cyclobenzaprine (FLEXERIL) 10 MG tablet Take 1 tablet (10 mg total) by mouth every 8 (eight) hours  as needed for muscle spasms. 60 tablet 1  . escitalopram (LEXAPRO) 10 MG tablet TAKE 1 TABLET BY MOUTH ONCE DAILY 90 tablet 4  . levothyroxine (SYNTHROID, LEVOTHROID) 112 MCG tablet TAKE 1 TABLET BY MOUTH ONCE DAILY BEFORE BREAKFAST. 90 tablet 4   No current facility-administered medications on file prior to visit.    Health Maintenance:    Immunization History  Administered Date(s) Administered  . Influenza, Seasonal, Injecte, Preservative Fre 02/26/2016  . 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 Pap: Dr. Melba Coon, 09/2015 MGM: 01/2016 for diagnostic s/p reduction DEXA: N/A Colonoscopy:N/A EGD: N/A CXR 08/2013 Last Dental Exam: [place] smiles Last Eye Exam: Dr. Alois Cliche  Patient Care Team: Unk Pinto, MD as PCP - General (Internal Medicine)  Medical History:  Past Medical History:  Diagnosis Date  . Anxiety   . Breast hypertrophy 03/2014  . GERD (gastroesophageal reflux disease)   . Hypothyroidism   . Interstitial cystitis    Allergies Allergies  Allergen Reactions  . Amoxicillin Rash and Other (See Comments)    FEVER    SURGICAL HISTORY She  has a past surgical history that includes No past surgeries and Breast reduction surgery (Bilateral, 04/18/2014). FAMILY HISTORY Her family history is not on file. SOCIAL HISTORY She  reports that she quit smoking about 4 years ago. She has never used smokeless tobacco. She reports that she drinks alcohol. She reports that she does not use drugs.  Review of Systems: Review of Systems  Constitutional: Negative.   HENT: Negative.   Eyes: Negative.   Respiratory: Negative.   Cardiovascular: Negative.   Gastrointestinal: Positive for heartburn. Negative for abdominal pain, blood in stool, constipation, diarrhea, melena, nausea and vomiting.  Genitourinary: Positive for frequency  and urgency. Negative for dysuria, flank pain and hematuria.  Musculoskeletal: Negative.   Skin: Negative.   Neurological: Negative.   Psychiatric/Behavioral: Negative.     Physical Exam: Estimated body mass index is 30.25 kg/m as calculated from the following:   Height as of this encounter: 5' 9.5" (1.765 m).   Weight as of this encounter: 207 lb 12.8 oz (94.3 kg). BP 120/86   Pulse 88   Temp 97.4 F (36.3 C)   Resp 16   Ht 5' 9.5" (1.765 m)   Wt 207 lb 12.8 oz (94.3 kg)   LMP 09/12/2016   SpO2 96%   BMI 30.25 kg/m   Wt Readings from Last 3 Encounters:  10/03/16 207 lb 12.8 oz (94.3 kg)  05/29/16 195 lb 6.4 oz (88.6 kg)  09/18/15 184 lb 12.8 oz (83.8 kg)   General Appearance: Well nourished, in no apparent distress.  Eyes: PERRLA, EOMs, conjunctiva no swelling or erythema, normal fundi and vessels.  Sinuses: No Frontal/maxillary tenderness  ENT/Mouth: Ext aud canals clear, normal light reflex  with TMs without erythema, bulging. Good dentition. No erythema, swelling, or exudate on post pharynx. Tonsils not swollen or erythematous. Hearing normal.  Neck: Supple, thyroid normal. No bruits  Respiratory: Respiratory effort normal, BS equal bilaterally without rales, rhonchi, wheezing or stridor.  Cardio: RRR without murmurs, rubs or gallops. Brisk peripheral pulses without edema.  Chest: symmetric, with normal excursions and percussion.  Breasts: defer Abdomen: Soft, nontender, no guarding, rebound, hernias, masses, or organomegaly.  Lymphatics: Non tender without lymphadenopathy.  Genitourinary: defer Musculoskeletal: Full ROM all peripheral extremities,5/5 strength, and normal gait.  Skin: Warm, dry without rashes, lesions, ecchymosis. Neuro: Cranial nerves intact, reflexes equal bilaterally. Normal muscle tone, no cerebellar symptoms. Sensation intact.  Psych: Awake and oriented X 3, normal affect, Insight and Judgment appropriate.   EKG: defer, normal 2015, no SX's    Vicie Mutters 9:32 AM [facility]

## 2016-10-03 ENCOUNTER — Encounter: Payer: Self-pay | Admitting: Physician Assistant

## 2016-10-03 ENCOUNTER — Telehealth: Payer: Self-pay | Admitting: Physician Assistant

## 2016-10-03 ENCOUNTER — Ambulatory Visit (INDEPENDENT_AMBULATORY_CARE_PROVIDER_SITE_OTHER): Payer: 59 | Admitting: Physician Assistant

## 2016-10-03 VITALS — BP 120/86 | HR 88 | Temp 97.4°F | Resp 16 | Ht 69.5 in | Wt 207.8 lb

## 2016-10-03 DIAGNOSIS — K2100 Gastro-esophageal reflux disease with esophagitis, without bleeding: Secondary | ICD-10-CM

## 2016-10-03 DIAGNOSIS — E559 Vitamin D deficiency, unspecified: Secondary | ICD-10-CM | POA: Diagnosis not present

## 2016-10-03 DIAGNOSIS — F411 Generalized anxiety disorder: Secondary | ICD-10-CM | POA: Diagnosis not present

## 2016-10-03 DIAGNOSIS — N3010 Interstitial cystitis (chronic) without hematuria: Secondary | ICD-10-CM | POA: Diagnosis not present

## 2016-10-03 DIAGNOSIS — R6889 Other general symptoms and signs: Secondary | ICD-10-CM

## 2016-10-03 DIAGNOSIS — E079 Disorder of thyroid, unspecified: Secondary | ICD-10-CM | POA: Diagnosis not present

## 2016-10-03 DIAGNOSIS — Z0001 Encounter for general adult medical examination with abnormal findings: Secondary | ICD-10-CM

## 2016-10-03 DIAGNOSIS — Z1389 Encounter for screening for other disorder: Secondary | ICD-10-CM

## 2016-10-03 DIAGNOSIS — D649 Anemia, unspecified: Secondary | ICD-10-CM

## 2016-10-03 DIAGNOSIS — Z13220 Encounter for screening for lipoid disorders: Secondary | ICD-10-CM | POA: Diagnosis not present

## 2016-10-03 DIAGNOSIS — Z79899 Other long term (current) drug therapy: Secondary | ICD-10-CM

## 2016-10-03 DIAGNOSIS — M5416 Radiculopathy, lumbar region: Secondary | ICD-10-CM

## 2016-10-03 LAB — CBC WITH DIFFERENTIAL/PLATELET
BASOS PCT: 1 %
Basophils Absolute: 58 cells/uL (ref 0–200)
EOS PCT: 4 %
Eosinophils Absolute: 232 cells/uL (ref 15–500)
HCT: 41.4 % (ref 35.0–45.0)
Hemoglobin: 13.7 g/dL (ref 11.7–15.5)
Lymphocytes Relative: 34 %
Lymphs Abs: 1972 cells/uL (ref 850–3900)
MCH: 30.2 pg (ref 27.0–33.0)
MCHC: 33.1 g/dL (ref 32.0–36.0)
MCV: 91.4 fL (ref 80.0–100.0)
MONOS PCT: 11 %
MPV: 10.1 fL (ref 7.5–12.5)
Monocytes Absolute: 638 cells/uL (ref 200–950)
NEUTROS ABS: 2900 {cells}/uL (ref 1500–7800)
Neutrophils Relative %: 50 %
PLATELETS: 333 10*3/uL (ref 140–400)
RBC: 4.53 MIL/uL (ref 3.80–5.10)
RDW: 13 % (ref 11.0–15.0)
WBC: 5.8 10*3/uL (ref 3.8–10.8)

## 2016-10-03 LAB — IRON AND TIBC
%SAT: 18 % (ref 11–50)
IRON: 76 ug/dL (ref 40–190)
TIBC: 433 ug/dL (ref 250–450)
UIBC: 357 ug/dL

## 2016-10-03 LAB — BASIC METABOLIC PANEL WITH GFR
BUN: 13 mg/dL (ref 7–25)
CALCIUM: 9.2 mg/dL (ref 8.6–10.2)
CHLORIDE: 103 mmol/L (ref 98–110)
CO2: 26 mmol/L (ref 20–31)
CREATININE: 1.01 mg/dL (ref 0.50–1.10)
GFR, Est African American: 79 mL/min (ref >=60)
GFR, Est Non African American: 68 mL/min (ref >=60)
GLUCOSE: 86 mg/dL (ref 65–99)
POTASSIUM: 4.4 mmol/L (ref 3.5–5.3)
SODIUM: 142 mmol/L (ref 135–146)

## 2016-10-03 LAB — LIPID PANEL
Cholesterol: 236 mg/dL — ABNORMAL HIGH
HDL: 75 mg/dL
LDL Cholesterol: 132 mg/dL — ABNORMAL HIGH
Total CHOL/HDL Ratio: 3.1 ratio
Triglycerides: 145 mg/dL
VLDL: 29 mg/dL

## 2016-10-03 LAB — HEPATIC FUNCTION PANEL
ALT: 25 U/L (ref 6–29)
AST: 23 U/L (ref 10–30)
Albumin: 4.4 g/dL (ref 3.6–5.1)
Alkaline Phosphatase: 77 U/L (ref 33–115)
Bilirubin, Direct: 0.1 mg/dL (ref <=0.2)
Indirect Bilirubin: 0.2 mg/dL (ref 0.2–1.2)
TOTAL PROTEIN: 7.2 g/dL (ref 6.1–8.1)
Total Bilirubin: 0.3 mg/dL (ref 0.2–1.2)

## 2016-10-03 MED ORDER — CYCLOBENZAPRINE HCL 10 MG PO TABS: 10.0000 mg | ORAL_TABLET | Freq: Three times a day (TID) | ORAL | 1 refills | Status: DC | PRN

## 2016-10-03 MED FILL — CYCLOBENZAPRINE 10 MG TAB: 10 | 20 days supply | Qty: 60 | Fill #0

## 2016-10-03 NOTE — Telephone Encounter (Signed)
error 

## 2016-10-03 NOTE — Patient Instructions (Signed)
Simple math prevails.    1st - exercise does not produce significant weight loss - at best one converts fat into muscle , "bulks up", loses inches, but usually stays "weight neutral"     2nd - think of your body weightas a check book: If you eat more calories than you burn up - you save money or gain weight .... Or if you spend more money than you put in the check book, ie burn up more calories than you eat, then you lose weight     3rd - if you walk or run 1 mile, you burn up 100 calories - you have to burn up 3,500 calories to lose 1 pound, ie you have to walk/run 35 miles to lose 1 measly pound. So if you want to lose 10 #, then you have to walk/run 350 miles, so.... clearly exercise is not the solution.     4. So if you consume 1,500 calories, then you have to burn up the equivalent of 15 miles to stay weight neutral - It also stands to reason that if you consume 1,500 cal/day and don't lose weight, then you must be burning up about 1,500 cals/day to stay weight neutral.     5. If you really want to lose weight, you must cut your calorie intake 300 calories /day and at that rate you should lose about 1 # every 3 days.   6. Please purchase Dr Fara Olden Fuhrman's book(s) "The End of Dieting" & "Eat to Live" . It has some great concepts and recipes.     Herniated Disk A herniated disk, also called a ruptured disk or slipped disk, occurs when a disk in the spine bulges out too far. Between the bones in the spine (vertebrae), there are oval disks that are made of a soft, spongy center that is surrounded by a tough outer ring. The disks connect your vertebrae, help your spine move, and absorb shocks from your movement. When you have a herniated disk, the spongy center of the disk bulges out or breaks through the outer ring. It can press on a nerve between the vertebrae and cause pain. This can occur anywhere in the back or neck area, but the lower back is most commonly affected. What are the  causes? This condition may be caused by:  Age-related wear and tear. The spongy centers of spinal disks tend to shrink and dry out with age, which makes them more likely to herniate.  Sudden injury, such as a strain or sprain. What increases the risk? Aging is the main risk factor for a herniated disk. Other risk factors include:  Being a man who is 59-52 years old.  Frequently doing activities that involve heavy lifting, bending, or twisting.  Frequently driving for long hours at a time.  Not getting enough exercise.  Being overweight.  Smoking.  Having a family history of back problems or herniated disks.  Being pregnant or giving birth.  Having poor nutrition.  Being tall. What are the signs or symptoms? Symptoms may vary depending on where your herniated disk is located.  A herniated disk in the lower back may cause sharp pain in:  Part of the arm, leg, hip, or buttocks.  The back of the lower leg (calf).  The lower back, spreading down through the leg into the foot (sciatica).  A herniated disk in the neck may cause dizziness and vertigo. It may also cause pain or weakness in:  The neck.  The shoulder  blades.  Upper arm, forearm, or fingers.  You may also have muscle weakness. It may be difficult to:  Lift your leg or arm.  Stand on your toes.  Squeeze tightly with one of your hands.  Other symptoms may include:  Numbness or tingling in the affected areas of the hands, arms, feet, or legs.  Inability to control when you urinate or when you have bowel movements. This is a rare but serious sign of a severe herniated disk in the lower back. How is this diagnosed? This condition may be diagnosed based on:  Your symptoms.  Your medical history.  A physical exam. The exam may include:  Straight-leg test. You will lie on your back while your health care provider lifts your leg, keeping your knee straight. If you feel pain, you likely have a herniated  disk.  Neurological tests. This includes checking for numbness, reflexes, muscle strength, and posture.  Imaging tests, such as:  X-rays.  MRI.  CT scan.  Electromyogram (EMG) to check the nerves that control muscles. This test may be used to determine which nerves are affected by your herniated disk. How is this treated? Treatment for this condition may include:  A short period of rest. This is usually the first treatment.  You may be on bed rest for up to 2 days, or you may be instructed to stay home and avoid physical activity.  If you have a herniated disk in your lower back, avoid sitting as much as possible. Sitting increases pressure on the disk.  Medicines. These may include:  NSAIDs to help reduce pain and swelling.  Muscle relaxants to prevent sudden tightening of the back muscles (back spasms).  Prescription pain medicines, if you have severe pain.  Steroid injections in the area of the herniated disk. This can help reduce pain and swelling.  Physical therapy to strengthen your back muscles. In many cases, symptoms go away with treatment over a period of days or weeks. You will most likely be free of symptoms after 3-4 months. If other treatments do not help to relieve your symptoms, you may need surgery. Follow these instructions at home: Medicines   Take over-the-counter and prescription medicines only as told by your health care provider.  Do not drive or use heavy machinery while taking prescription pain medicine. Activity   Rest as directed.  After your rest period:  Return to your normal activities and gradually begin exercising as told by your health care provider. Ask your health care provider what activities and exercises are safe for you.  Use good posture.  Avoid movements that cause pain.  Do not lift anything that is heavier than 10 lb (4.5 kg) until your health care provider says this is safe.  Do not sit or stand for long periods of time  without changing positions.  Do not sit for long periods of time without getting up and moving around.  If physical therapy was prescribed, do exercises as instructed.  Aim to strengthen muscles in your back and abdomen with exercises like crunches, swimming, or walking. General instructions   Do not use any products that contain nicotine or tobacco, such as cigarettes and e-cigarettes. These products can delay healing. If you need help quitting, ask your health care provider.  Do not wear high-heeled shoes.  Do not sleep on your belly.  If you are overweight, work with your health care provider to lose weight safely.  To prevent or treat constipation while you are taking  prescription pain medicine, your health care provider may recommend that you:  Drink enough fluid to keep your urine clear or pale yellow.  Take over-the-counter or prescription medicines.  Eat foods that are high in fiber, such as fresh fruits and vegetables, whole grains, and beans.  Limit foods that are high in fat and processed sugars, such as fried and sweet foods.  Keep all follow-up visits as told by your health care provider. This is important. How is this prevented?  Maintain a healthy weight.  Try to avoid stressful situations.  Maintain physical fitness. Do at least 150 minutes of moderate-intensity exercise each week, such as brisk walking or water aerobics.  When lifting objects:  Keep your feet at least shoulder-width apart and tighten your abdominal muscles.  Keep your spine neutral as you bend your knees and hips. It is important to lift using the strength of your legs, not your back. Do not lock your knees straight out.  Always ask for help to lift heavy or awkward objects. Contact a health care provider if:  You have back pain or neck pain that does not get better after 6 weeks.  You have severe pain in your back, neck, legs, or arms.  You develop numbness, tingling, or weakness in  any part of your body. Get help right away if:  You cannot move your arms or legs.  You cannot control when you urinate or have bowel movements.  You feel dizzy or you faint.  You have shortness of breath. This information is not intended to replace advice given to you by your health care provider. Make sure you discuss any questions you have with your health care provider. Document Released: 05/02/2000 Document Revised: 12/31/2015 Document Reviewed: 12/31/2015 Elsevier Interactive Patient Education  2017 [employer].

## 2016-10-04 LAB — URINALYSIS, ROUTINE W REFLEX MICROSCOPIC
BILIRUBIN URINE: NEGATIVE
GLUCOSE, UA: NEGATIVE
HGB URINE DIPSTICK: NEGATIVE
Ketones, ur: NEGATIVE
Leukocytes, UA: NEGATIVE
Nitrite: NEGATIVE
PROTEIN: NEGATIVE
Specific Gravity, Urine: 1.013 (ref 1.001–1.035)
pH: 6 (ref 5.0–8.0)

## 2016-10-04 LAB — TSH: TSH: 3.81 m[IU]/L

## 2016-10-04 LAB — VITAMIN D 25 HYDROXY (VIT D DEFICIENCY, FRACTURES): VIT D 25 HYDROXY: 35 ng/mL (ref 30–100)

## 2016-10-04 LAB — MICROALBUMIN / CREATININE URINE RATIO
CREATININE, URINE: 95 mg/dL (ref 20–320)
Microalb Creat Ratio: 4 mcg/mg creat (ref <30)
Microalb, Ur: 0.4 mg/dL

## 2016-10-04 LAB — MAGNESIUM: MAGNESIUM: 2.1 mg/dL (ref 1.5–2.5)

## 2016-10-18 DIAGNOSIS — L255 Unspecified contact dermatitis due to plants, except food: Secondary | ICD-10-CM | POA: Diagnosis not present

## 2016-10-20 MED FILL — ALPRAZolam 0.5 MG TABS: 0.5 | 30 days supply | Qty: 90 | Fill #1

## 2016-10-20 MED FILL — LEVOTHYROXINE 112 MCG TAB: 112 | 90 days supply | Qty: 90 | Fill #2

## 2016-10-20 MED FILL — ESCITALOPRAM 10 MG TABLET: 10 | 90 days supply | Qty: 90 | Fill #2

## 2016-10-21 ENCOUNTER — Ambulatory Visit (INDEPENDENT_AMBULATORY_CARE_PROVIDER_SITE_OTHER): Payer: Self-pay | Admitting: Orthopedic Surgery

## 2016-10-27 DIAGNOSIS — M549 Dorsalgia, unspecified: Secondary | ICD-10-CM | POA: Diagnosis not present

## 2016-10-27 DIAGNOSIS — M5136 Other intervertebral disc degeneration, lumbar region: Secondary | ICD-10-CM | POA: Diagnosis not present

## 2016-10-27 DIAGNOSIS — M546 Pain in thoracic spine: Secondary | ICD-10-CM | POA: Diagnosis not present

## 2016-10-27 DIAGNOSIS — M47816 Spondylosis without myelopathy or radiculopathy, lumbar region: Secondary | ICD-10-CM | POA: Diagnosis not present

## 2016-10-27 DIAGNOSIS — M5416 Radiculopathy, lumbar region: Secondary | ICD-10-CM | POA: Diagnosis not present

## 2016-10-28 ENCOUNTER — Other Ambulatory Visit (HOSPITAL_COMMUNITY): Payer: Self-pay | Admitting: Neurosurgery

## 2016-10-28 DIAGNOSIS — M5416 Radiculopathy, lumbar region: Secondary | ICD-10-CM

## 2016-11-04 ENCOUNTER — Ambulatory Visit (HOSPITAL_COMMUNITY): Admission: RE | Admit: 2016-11-04 | Payer: 59 | Source: Ambulatory Visit

## 2016-11-26 ENCOUNTER — Other Ambulatory Visit: Payer: Self-pay | Admitting: Internal Medicine

## 2016-11-26 DIAGNOSIS — H5203 Hypermetropia, bilateral: Secondary | ICD-10-CM | POA: Diagnosis not present

## 2016-11-26 DIAGNOSIS — H524 Presbyopia: Secondary | ICD-10-CM | POA: Diagnosis not present

## 2016-11-27 MED FILL — AMITRIPTYLINE HCL 25 MG TAB: 25 | 90 days supply | Qty: 270 | Fill #0

## 2016-11-29 ENCOUNTER — Encounter: Payer: Self-pay | Admitting: Physician Assistant

## 2016-11-30 ENCOUNTER — Other Ambulatory Visit: Payer: Self-pay | Admitting: Physician Assistant

## 2016-11-30 MED ORDER — METOPROLOL TARTRATE 25 MG PO TABS: ORAL_TABLET | ORAL | 1 refills | Status: DC

## 2016-12-01 ENCOUNTER — Other Ambulatory Visit: Payer: Self-pay

## 2016-12-01 MED ORDER — METOPROLOL TARTRATE 25 MG PO TABS: ORAL_TABLET | ORAL | 1 refills | Status: DC

## 2016-12-11 ENCOUNTER — Encounter: Payer: Self-pay | Admitting: Physician Assistant

## 2016-12-14 ENCOUNTER — Other Ambulatory Visit: Payer: Self-pay | Admitting: Physician Assistant

## 2016-12-14 MED ORDER — VERAPAMIL HCL ER 120 MG PO TBCR: 120.0000 mg | EXTENDED_RELEASE_TABLET | Freq: Every day | ORAL | 11 refills | Status: DC

## 2016-12-15 MED FILL — VERAPAMIL ER 120 MG TABLET: 120 | 30 days supply | Qty: 30 | Fill #0

## 2016-12-29 DIAGNOSIS — Z124 Encounter for screening for malignant neoplasm of cervix: Secondary | ICD-10-CM | POA: Diagnosis not present

## 2016-12-29 DIAGNOSIS — L259 Unspecified contact dermatitis, unspecified cause: Secondary | ICD-10-CM | POA: Diagnosis not present

## 2016-12-29 DIAGNOSIS — Z130 Encounter for screening for diseases of the blood and blood-forming organs and certain disorders involving the immune mechanism: Secondary | ICD-10-CM | POA: Diagnosis not present

## 2016-12-29 DIAGNOSIS — Z1389 Encounter for screening for other disorder: Secondary | ICD-10-CM | POA: Diagnosis not present

## 2016-12-29 DIAGNOSIS — Z1151 Encounter for screening for human papillomavirus (HPV): Secondary | ICD-10-CM | POA: Diagnosis not present

## 2016-12-29 DIAGNOSIS — Z01419 Encounter for gynecological examination (general) (routine) without abnormal findings: Secondary | ICD-10-CM | POA: Diagnosis not present

## 2016-12-29 DIAGNOSIS — N3010 Interstitial cystitis (chronic) without hematuria: Secondary | ICD-10-CM | POA: Diagnosis not present

## 2016-12-30 ENCOUNTER — Encounter: Payer: Self-pay | Admitting: Physician Assistant

## 2017-01-01 ENCOUNTER — Other Ambulatory Visit: Payer: Self-pay | Admitting: Obstetrics and Gynecology

## 2017-01-01 DIAGNOSIS — N6489 Other specified disorders of breast: Secondary | ICD-10-CM

## 2017-01-05 ENCOUNTER — Other Ambulatory Visit: Payer: Self-pay | Admitting: Obstetrics and Gynecology

## 2017-01-05 DIAGNOSIS — N6489 Other specified disorders of breast: Secondary | ICD-10-CM

## 2017-01-05 MED ORDER — DILTIAZEM HCL ER COATED BEADS 120 MG PO CP24: 120.0000 mg | ORAL_CAPSULE | Freq: Every day | ORAL | 11 refills | Status: DC

## 2017-01-06 NOTE — Progress Notes (Signed)
Assessment and Plan:   Thyroid disease Hypothyroidism-check TSH level, continue medications the same, reminded to take on an empty stomach 30-48mins before food.  -     TSH  Interstitial cystitis Has follow up urology  Anemia, unspecified type -     CBC with Differential/Platelet -     Magnesium  Generalized anxiety disorder     bisoprolol (ZEBETA) 5 MG tablet; 1/2-1 tablet at night for heart rate -     Magnesium - can increase lexapro to 20  Hypertension, unspecified type     bisoprolol (ZEBETA) 5 MG tablet; 1/2-1 tablet at night for heart rate -     BASIC METABOLIC PANEL WITH GFR -     Hepatic function panel  Mixed hyperlipidemia -continue medications, check lipids, decrease fatty foods, increase activity.  -     Lipid panel   Continue diet and meds as discussed. Further disposition pending results of labs. Over 30 minutes of exam, counseling, chart review, and critical decision making was performed  Future Appointments Date Time Provider [place]  01/29/2017 11:00 AM [facility] DIAG [place] 1 [facility]  01/29/2017 11:10 AM [facility] [place] 1 [facility]  10/09/2017 9:30 AM Vicie Mutters, PA-C [facility] None     HPI 46 y.o. female  presents for 3 month follow up on hypertension, cholesterol, prediabetes, and vitamin D deficiency.   Her blood pressure has been controlled at home, today their BP is BP: 126/84  She does workout. She denies chest pain, shortness of breath, dizziness. She has had sinus tacycardia and trouble sleeping, unable to tolerate metoprolol and had rash with verapamil.   She is not on cholesterol medication and denies myalgias. Her cholesterol is at goal. The cholesterol last visit was:   Lab Results  Component Value Date   CHOL 236 (H) 10/03/2016   HDL 75 10/03/2016   LDLCALC 132 (H) 10/03/2016   TRIG 145 10/03/2016   CHOLHDL 3.1 10/03/2016   She is on thyroid medication. Her medication was not changed last visit.     Lab Results  Component Value Date   TSH 3.81 10/03/2016  .  Patient is on Vitamin D supplement.   Lab Results  Component Value Date   VD25OH 35 10/03/2016     BMI is Body mass index is 30.42 kg/m., she is working on diet and exercise. Wt Readings from Last 3 Encounters:  01/08/17 209 lb (94.8 kg)  10/03/16 207 lb 12.8 oz (94.3 kg)  05/29/16 195 lb 6.4 oz (88.6 kg)   She has anxiety and some palpitations, had rebound HTN/HR with metoprolol tartrate had rash with verapamil, states felt better with BB, helped anxiety, sleep.   Current Medications:  Current Outpatient Prescriptions on File Prior to Visit  Medication Sig  . ALPRAZolam (XANAX) 0.5 MG tablet Take 1 tablet (0.5 mg total) by mouth 3 (three) times daily as needed for anxiety.  Marland Kitchen amitriptyline (ELAVIL) 25 MG tablet TAKE 1 TABLET BY MOUTH 3 TIMES DAILY AS NEEDED  . cyclobenzaprine (FLEXERIL) 10 MG tablet Take 1 tablet (10 mg total) by mouth every 8 (eight) hours as needed for muscle spasms.  Marland Kitchen escitalopram (LEXAPRO) 10 MG tablet TAKE 1 TABLET BY MOUTH ONCE DAILY  . levothyroxine (SYNTHROID, LEVOTHROID) 112 MCG tablet TAKE 1 TABLET BY MOUTH ONCE DAILY BEFORE BREAKFAST.   No current facility-administered medications on file prior to visit.     Medical History:  Past Medical History:  Diagnosis Date  . Anxiety   .  Breast hypertrophy 03/2014  . GERD (gastroesophageal reflux disease)   . Hypothyroidism   . Interstitial cystitis    Allergies:  Allergies  Allergen Reactions  . Amoxicillin Rash and Other (See Comments)    FEVER     Review of Systems:  Review of Systems  Constitutional: Negative.   HENT: Negative.   Eyes: Negative.   Respiratory: Negative.   Cardiovascular: Negative.   Gastrointestinal: Negative.   Genitourinary: Negative.   Musculoskeletal: Negative.   Skin: Negative.   Neurological: Negative.   Endo/Heme/Allergies: Negative.   Psychiatric/Behavioral: Negative.     Family history- Review  and unchanged Social history- Review and unchanged Physical Exam: BP 126/84   Pulse 71   Temp 97.7 F (36.5 C)   Resp 18   Ht 5' 9.5" (1.765 m)   Wt 209 lb (94.8 kg)   LMP 12/09/2016   SpO2 96%   BMI 30.42 kg/m  Wt Readings from Last 3 Encounters:  01/08/17 209 lb (94.8 kg)  10/03/16 207 lb 12.8 oz (94.3 kg)  05/29/16 195 lb 6.4 oz (88.6 kg)   General Appearance: Well nourished, in no apparent distress. Eyes: PERRLA, EOMs, conjunctiva no swelling or erythema Sinuses: No Frontal/maxillary tenderness ENT/Mouth: Ext aud canals clear, TMs without erythema, bulging. No erythema, swelling, or exudate on post pharynx.  Tonsils not swollen or erythematous. Hearing normal.  Neck: Supple, thyroid normal.  Respiratory: Respiratory effort normal, BS equal bilaterally without rales, rhonchi, wheezing or stridor.  Cardio: RRR with no MRGs. Brisk peripheral pulses without edema.  Abdomen: Soft, + BS,  Non tender, no guarding, rebound, hernias, masses. Lymphatics: Non tender without lymphadenopathy.  Musculoskeletal: Full ROM, 5/5 strength, Normal gait Skin: Warm, dry without rashes, lesions, ecchymosis.  Neuro: Cranial nerves intact. Normal muscle tone, no cerebellar symptoms. Psych: Awake and oriented X 3, normal affect, Insight and Judgment appropriate.    Vicie Mutters, PA-C 10:45 AM [facility]

## 2017-01-08 ENCOUNTER — Ambulatory Visit (INDEPENDENT_AMBULATORY_CARE_PROVIDER_SITE_OTHER): Payer: 59 | Admitting: Physician Assistant

## 2017-01-08 ENCOUNTER — Encounter: Payer: Self-pay | Admitting: Physician Assistant

## 2017-01-08 VITALS — BP 126/84 | HR 71 | Temp 97.7°F | Resp 18 | Ht 69.5 in | Wt 209.0 lb

## 2017-01-08 DIAGNOSIS — E079 Disorder of thyroid, unspecified: Secondary | ICD-10-CM | POA: Diagnosis not present

## 2017-01-08 DIAGNOSIS — E559 Vitamin D deficiency, unspecified: Secondary | ICD-10-CM | POA: Diagnosis not present

## 2017-01-08 DIAGNOSIS — N3010 Interstitial cystitis (chronic) without hematuria: Secondary | ICD-10-CM | POA: Diagnosis not present

## 2017-01-08 DIAGNOSIS — F411 Generalized anxiety disorder: Secondary | ICD-10-CM

## 2017-01-08 DIAGNOSIS — K2100 Gastro-esophageal reflux disease with esophagitis, without bleeding: Secondary | ICD-10-CM

## 2017-01-08 DIAGNOSIS — D649 Anemia, unspecified: Secondary | ICD-10-CM

## 2017-01-08 DIAGNOSIS — I10 Essential (primary) hypertension: Secondary | ICD-10-CM | POA: Diagnosis not present

## 2017-01-08 DIAGNOSIS — E782 Mixed hyperlipidemia: Secondary | ICD-10-CM | POA: Diagnosis not present

## 2017-01-08 MED ORDER — BISOPROLOL FUMARATE 5 MG PO TABS: ORAL_TABLET | ORAL | 0 refills | Status: DC

## 2017-01-08 MED FILL — BISOPROLOL FUMARATE 5 MG TA: 5 | 30 days supply | Qty: 30 | Fill #0

## 2017-01-09 ENCOUNTER — Other Ambulatory Visit: Payer: Self-pay | Admitting: Physician Assistant

## 2017-01-09 DIAGNOSIS — R7989 Other specified abnormal findings of blood chemistry: Secondary | ICD-10-CM

## 2017-01-09 DIAGNOSIS — R945 Abnormal results of liver function studies: Principal | ICD-10-CM

## 2017-01-09 LAB — BASIC METABOLIC PANEL WITH GFR
BUN: 14 mg/dL (ref 7–25)
CO2: 28 mmol/L (ref 20–32)
Calcium: 9.4 mg/dL (ref 8.6–10.2)
Chloride: 103 mmol/L (ref 98–110)
Creat: 0.87 mg/dL (ref 0.50–1.10)
GFR, Est African American: 95 mL/min/{1.73_m2} (ref >=60)
GFR, Est Non African American: 82 mL/min/{1.73_m2} (ref >=60)
Glucose, Bld: 91 mg/dL (ref 65–99)
Potassium: 4.6 mmol/L (ref 3.5–5.3)
Sodium: 137 mmol/L (ref 135–146)

## 2017-01-09 LAB — LIPID PANEL
CHOLESTEROL: 254 mg/dL — AB (ref <200)
HDL: 85 mg/dL (ref >50)
LDL CHOLESTEROL (CALC): 154 mg/dL — AB
Non-HDL Cholesterol (Calc): 169 mg/dL (calc) — ABNORMAL HIGH (ref <130)
Total CHOL/HDL Ratio: 3 (calc) (ref <5.0)
Triglycerides: 60 mg/dL (ref <150)

## 2017-01-09 LAB — CBC WITH DIFFERENTIAL/PLATELET
Basophils Absolute: 83 cells/uL (ref 0–200)
Basophils Relative: 1.5 %
Eosinophils Absolute: 138 cells/uL (ref 15–500)
Eosinophils Relative: 2.5 %
HCT: 41.5 % (ref 35.0–45.0)
Hemoglobin: 14.2 g/dL (ref 11.7–15.5)
Lymphs Abs: 1491 cells/uL (ref 850–3900)
MCH: 30 pg (ref 27.0–33.0)
MCHC: 34.2 g/dL (ref 32.0–36.0)
MCV: 87.6 fL (ref 80.0–100.0)
MPV: 11.3 fL (ref 7.5–12.5)
Monocytes Relative: 10.7 %
Neutro Abs: 3201 cells/uL (ref 1500–7800)
Neutrophils Relative %: 58.2 %
Platelets: 309 10*3/uL (ref 140–400)
RBC: 4.74 10*6/uL (ref 3.80–5.10)
RDW: 12.1 % (ref 11.0–15.0)
Total Lymphocyte: 27.1 %
WBC mixed population: 589 cells/uL (ref 200–950)
WBC: 5.5 10*3/uL (ref 3.8–10.8)

## 2017-01-09 LAB — HEPATIC FUNCTION PANEL
AG RATIO: 1.4 (calc) (ref 1.0–2.5)
ALBUMIN MSPROF: 4.4 g/dL (ref 3.6–5.1)
ALKALINE PHOSPHATASE (APISO): 120 U/L — AB (ref 33–115)
ALT: 122 U/L — AB (ref 6–29)
AST: 116 U/L — AB (ref 10–30)
Bilirubin, Direct: 0.1 mg/dL (ref 0.0–0.2)
Globulin: 3.2 g/dL (calc) (ref 1.9–3.7)
Indirect Bilirubin: 0.5 mg/dL (calc) (ref 0.2–1.2)
Total Bilirubin: 0.6 mg/dL (ref 0.2–1.2)
Total Protein: 7.6 g/dL (ref 6.1–8.1)

## 2017-01-09 LAB — MAGNESIUM: MAGNESIUM: 2.2 mg/dL (ref 1.5–2.5)

## 2017-01-09 LAB — TSH: TSH: 4.64 mIU/L — ABNORMAL HIGH

## 2017-01-12 MED FILL — LEVOTHYROXINE 112 MCG TAB: 112 | 90 days supply | Qty: 90 | Fill #3

## 2017-01-12 MED FILL — ALPRAZolam 0.5 MG TABS: 0.5 | 30 days supply | Qty: 90 | Fill #0

## 2017-01-12 MED FILL — ESCITALOPRAM 10 MG TABLET: 10 | 90 days supply | Qty: 90 | Fill #3

## 2017-01-15 ENCOUNTER — Other Ambulatory Visit: Payer: 59

## 2017-01-15 DIAGNOSIS — R945 Abnormal results of liver function studies: Principal | ICD-10-CM

## 2017-01-15 DIAGNOSIS — R7989 Other specified abnormal findings of blood chemistry: Secondary | ICD-10-CM

## 2017-01-15 LAB — CBC WITH DIFFERENTIAL/PLATELET
BASOS ABS: 61 {cells}/uL (ref 0–200)
Basophils Relative: 1.3 %
EOS ABS: 141 {cells}/uL (ref 15–500)
EOS PCT: 3 %
HCT: 38.9 % (ref 35.0–45.0)
HEMOGLOBIN: 13.2 g/dL (ref 11.7–15.5)
LYMPHS ABS: 1325 {cells}/uL (ref 850–3900)
MCH: 29.8 pg (ref 27.0–33.0)
MCHC: 33.9 g/dL (ref 32.0–36.0)
MCV: 87.8 fL (ref 80.0–100.0)
MONOS PCT: 10.4 %
MPV: 11.2 fL (ref 7.5–12.5)
NEUTROS ABS: 2684 {cells}/uL (ref 1500–7800)
Neutrophils Relative %: 57.1 %
PLATELETS: 306 10*3/uL (ref 140–400)
RBC: 4.43 10*6/uL (ref 3.80–5.10)
RDW: 12 % (ref 11.0–15.0)
Total Lymphocyte: 28.2 %
WBC mixed population: 489 cells/uL (ref 200–950)
WBC: 4.7 10*3/uL (ref 3.8–10.8)

## 2017-01-15 LAB — BASIC METABOLIC PANEL WITH GFR
BUN: 13 mg/dL (ref 7–25)
CHLORIDE: 102 mmol/L (ref 98–110)
CO2: 28 mmol/L (ref 20–32)
CREATININE: 0.99 mg/dL (ref 0.50–1.10)
Calcium: 9 mg/dL (ref 8.6–10.2)
GFR, Est African American: 81 mL/min/{1.73_m2} (ref >=60)
GFR, Est Non African American: 70 mL/min/{1.73_m2} (ref >=60)
GLUCOSE: 90 mg/dL (ref 65–99)
POTASSIUM: 4.6 mmol/L (ref 3.5–5.3)
SODIUM: 138 mmol/L (ref 135–146)

## 2017-01-15 LAB — HEPATIC FUNCTION PANEL
AG Ratio: 1.6 (calc) (ref 1.0–2.5)
ALKALINE PHOSPHATASE (APISO): 92 U/L (ref 33–115)
ALT: 32 U/L — ABNORMAL HIGH (ref 6–29)
AST: 21 U/L (ref 10–30)
Albumin: 4.1 g/dL (ref 3.6–5.1)
BILIRUBIN INDIRECT: 0.5 mg/dL (ref 0.2–1.2)
Bilirubin, Direct: 0.1 mg/dL (ref 0.0–0.2)
Globulin: 2.6 g/dL (calc) (ref 1.9–3.7)
TOTAL PROTEIN: 6.7 g/dL (ref 6.1–8.1)
Total Bilirubin: 0.6 mg/dL (ref 0.2–1.2)

## 2017-01-15 LAB — FERRITIN: FERRITIN: 37 ng/mL (ref 10–232)

## 2017-01-16 ENCOUNTER — Encounter: Payer: Self-pay | Admitting: Physician Assistant

## 2017-01-29 ENCOUNTER — Ambulatory Visit
Admission: RE | Admit: 2017-01-29 | Discharge: 2017-01-29 | Disposition: A | Discharge status: Home / Self Care | Payer: 59 | Source: Ambulatory Visit | Attending: Obstetrics and Gynecology | Admitting: Obstetrics and Gynecology

## 2017-01-29 ENCOUNTER — Other Ambulatory Visit: Payer: Self-pay | Admitting: Obstetrics and Gynecology

## 2017-01-29 DIAGNOSIS — N6320 Unspecified lump in the left breast, unspecified quadrant: Secondary | ICD-10-CM

## 2017-01-29 DIAGNOSIS — R928 Other abnormal and inconclusive findings on diagnostic imaging of breast: Secondary | ICD-10-CM | POA: Diagnosis not present

## 2017-01-29 DIAGNOSIS — N6489 Other specified disorders of breast: Secondary | ICD-10-CM | POA: Diagnosis not present

## 2017-01-29 MED FILL — CYCLOBENZAPRINE 10 MG TAB: 10 | 20 days supply | Qty: 60 | Fill #1

## 2017-01-30 ENCOUNTER — Other Ambulatory Visit: Payer: Self-pay | Admitting: Obstetrics and Gynecology

## 2017-01-30 ENCOUNTER — Encounter: Payer: Self-pay | Admitting: Physician Assistant

## 2017-01-30 DIAGNOSIS — N6489 Other specified disorders of breast: Secondary | ICD-10-CM

## 2017-01-30 DIAGNOSIS — R922 Inconclusive mammogram: Secondary | ICD-10-CM

## 2017-01-31 ENCOUNTER — Other Ambulatory Visit: Payer: Self-pay | Admitting: Physician Assistant

## 2017-01-31 MED ORDER — VERAPAMIL HCL ER 120 MG PO TBCR: 120.0000 mg | EXTENDED_RELEASE_TABLET | Freq: Every day | ORAL | 0 refills | Status: DC

## 2017-02-03 ENCOUNTER — Encounter: Payer: Self-pay | Admitting: Physician Assistant

## 2017-02-03 MED ORDER — DILTIAZEM HCL ER 120 MG PO CP24: 120.0000 mg | ORAL_CAPSULE | Freq: Every day | ORAL | 0 refills | Status: DC

## 2017-02-04 MED FILL — DILT XR 120 MG CAPSULE: 120 | 30 days supply | Qty: 30 | Fill #0

## 2017-02-20 ENCOUNTER — Ambulatory Visit
Admission: RE | Admit: 2017-02-20 | Discharge: 2017-02-20 | Disposition: A | Discharge status: Home / Self Care | Payer: 59 | Source: Ambulatory Visit | Attending: Obstetrics and Gynecology | Admitting: Obstetrics and Gynecology

## 2017-02-20 ENCOUNTER — Other Ambulatory Visit: Payer: Self-pay | Admitting: Obstetrics and Gynecology

## 2017-02-20 DIAGNOSIS — R922 Inconclusive mammogram: Secondary | ICD-10-CM

## 2017-02-20 DIAGNOSIS — N6012 Diffuse cystic mastopathy of left breast: Secondary | ICD-10-CM | POA: Diagnosis not present

## 2017-02-20 DIAGNOSIS — N6489 Other specified disorders of breast: Secondary | ICD-10-CM | POA: Diagnosis not present

## 2017-03-04 ENCOUNTER — Other Ambulatory Visit: Payer: Self-pay | Admitting: Physician Assistant

## 2017-03-04 DIAGNOSIS — R351 Nocturia: Secondary | ICD-10-CM | POA: Diagnosis not present

## 2017-03-04 DIAGNOSIS — R8271 Bacteriuria: Secondary | ICD-10-CM | POA: Diagnosis not present

## 2017-03-04 DIAGNOSIS — N3020 Other chronic cystitis without hematuria: Secondary | ICD-10-CM | POA: Diagnosis not present

## 2017-03-04 MED FILL — DILT XR 120 MG CAPSULE: 120 | 30 days supply | Qty: 30 | Fill #0

## 2017-03-23 ENCOUNTER — Encounter: Payer: Self-pay | Admitting: Physician Assistant

## 2017-03-25 ENCOUNTER — Other Ambulatory Visit: Payer: Self-pay | Admitting: Physician Assistant

## 2017-03-30 ENCOUNTER — Other Ambulatory Visit: Payer: Self-pay

## 2017-03-30 MED ORDER — DILTIAZEM HCL ER 120 MG PO CP24: ORAL_CAPSULE | ORAL | 0 refills | Status: DC

## 2017-03-30 MED FILL — DILT XR 120 MG CAPSULE: 120 | 30 days supply | Qty: 60 | Fill #0

## 2017-04-06 ENCOUNTER — Other Ambulatory Visit: Payer: Self-pay | Admitting: Physician Assistant

## 2017-04-06 MED FILL — AMITRIPTYLINE HCL 25 MG TAB: 25 | 90 days supply | Qty: 270 | Fill #1

## 2017-04-06 MED FILL — ESCITALOPRAM 10 MG TABLET: 10 | 90 days supply | Qty: 90 | Fill #0

## 2017-04-07 MED FILL — OXYBUTYNIN CL ER 10 MG TAB: 10 | 30 days supply | Qty: 30 | Fill #0

## 2017-04-13 ENCOUNTER — Encounter: Payer: Self-pay | Admitting: Physician Assistant

## 2017-04-23 DIAGNOSIS — E669 Obesity, unspecified: Secondary | ICD-10-CM | POA: Insufficient documentation

## 2017-04-23 DIAGNOSIS — E785 Hyperlipidemia, unspecified: Secondary | ICD-10-CM | POA: Insufficient documentation

## 2017-04-23 DIAGNOSIS — G4700 Insomnia, unspecified: Secondary | ICD-10-CM | POA: Insufficient documentation

## 2017-04-23 NOTE — Progress Notes (Signed)
FOLLOW UP  Assessment and Plan:   Diagnoses and all orders for this visit:  Thyroid disease continue medications pending lab results; may need to increase dose reminded to take on an empty stomach 30-68mins before food.  check TSH level  Anemia, unspecified type CBC  Generalized anxiety disorder Lexapro 20 mg daily, xanax as needed but using frequently - discussed risks and she is in a agreement that she would like to cut down use Will start Buspar today 10 mg TID - as she has not tried this medication Discussed CBT today - list of providers given for patient to schedule Not tolerating diltiazem 240 mg for tachycardia - will trial 180 mg, if not tolerating or ineffective after 2 weeks, she will try switching to bystolic 5 mg dailly Will follow up in 8 weeks to discuss progress - discussed we may be at the point to refer for psych, then cardiology - she is in a agreement  Gastroesophageal reflux disease with esophagitis Well managed on current medications - PRN OTC Protonix, discussed ranitidine as alternative Discussed diet, avoiding triggers and other lifestyle changes  Insomnia due to other mental disorder Patient reports she is doing well with current medications and melatonin  good sleep hygiene discussed, increase day time activity  Cholesterol Not currently on treatment Continue low cholesterol diet and exercise.  Check lipid panel.   Overweight Long discussion about weight loss, diet, and exercise Recommended diet heavy in fruits and veggies and low in animal meats, cheeses, and dairy products, appropriate calorie intake Follow up at next visit  Continue diet and meds as discussed. Further disposition pending results of labs. Discussed med's effects and SE's.   Over 30 minutes of exam, counseling, chart review, and critical decision making was performed.   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[employer] -  presents for 4 month follow up on GAD, insomina, GERD, anemia, hyperlipidemia, obesity and hypothyroid.At the last visit she was tolerating lexapro 10 mg well but with continued anxious symptoms and was increased to 20 mg; she also reports tachycardia related to anxiety and has been on bisoprolol and verapmil in the past which she did not tolerate well; she has been on diltiazem 120 mg and tolerating well and was titrated up to 240 mg - she reports intermittent chest discomfort since this dose change - returned to 120 mg dose 1 week ago and chest discomfort has resolved. She continues to experience intermittent tachycardia with rates ranging from 110- 130s; unclear whether tachycardia is provoked by anxiety, but does appear to be a contributing factor. She has been on paxil, prozac, celexa, effexor (had bad reaction) Has never tried buspar - has not done CBT - has not seen psych  BMI is Body mass index is 31.29 kg/m., she has not been working on diet and exercise. Wt Readings from Last 3 Encounters:  04/24/17 215 lb (97.5 kg)  01/08/17 209 lb (94.8 kg)  10/03/16 207 lb 12.8 oz (94.3 kg)   Her blood pressure has been controlled at home, today their BP is BP: 110/80  She does not workout. She denies chest pain, shortness of breath, dizziness with exertion but endorses with anxiety/tachycardia.    She is not on cholesterol medication and denies myalgias. Her cholesterol is not at goal. The cholesterol last visit was:   Lab Results  Component Value Date   CHOL 254 (H) 01/08/2017   HDL 85 01/08/2017   LDLCALC 132 (H) 10/03/2016   TRIG 60 01/08/2017   CHOLHDL 3.0 01/08/2017   She is on thyroid medication. Her medication was not changed last visit.   Lab Results  Component Value Date    TSH 4.64 (H) 01/08/2017  .    Current Medications:  Current Outpatient Medications on File Prior to Visit  Medication Sig  . ALPRAZolam (XANAX) 0.5 MG tablet Take 1 tablet (0.5 mg total) by mouth 3 (three) times daily as needed for anxiety.  Marland Kitchen amitriptyline (ELAVIL) 25 MG tablet TAKE 1 TABLET BY MOUTH 3 TIMES DAILY AS NEEDED  . Cholecalciferol (VITAMIN D PO) Take 4,000 Units by mouth.  . cyclobenzaprine (FLEXERIL) 10 MG tablet Take 1 tablet (10 mg total) by mouth every 8 (eight) hours as needed for muscle spasms.  Marland Kitchen escitalopram (LEXAPRO) 10 MG tablet TAKE 1 TABLET BY MOUTH ONCE DAILY (Patient taking differently: TAKE 2 TABLET BY MOUTH ONCE DAILY)  . oxybutynin (DITROPAN XL) 10 MG 24 hr tablet Take 10 mg by mouth at bedtime.   No current facility-administered medications on file prior to visit.      Allergies:  Allergies  Allergen Reactions  . Bisoprolol   . Metoprolol Tartrate   . Amoxicillin Rash and Other (See Comments)    FEVER  . Verapamil Rash     Medical History:  Past Medical History:  Diagnosis Date  . Anxiety   . Breast hypertrophy 03/2014  . GERD (gastroesophageal reflux disease)   . Hypothyroidism   . Interstitial cystitis    Family history- Reviewed and unchanged Social history- Reviewed and unchanged   Review of Systems:  Review of Systems  Constitutional: Negative for malaise/fatigue and weight loss.  HENT: Negative for hearing loss and tinnitus.   Eyes: Negative for blurred vision and double vision.  Respiratory: Negative for cough, shortness of breath and wheezing.   Cardiovascular: Positive for palpitations. Negative for chest pain, orthopnea, claudication and leg swelling.  Gastrointestinal: Positive for heartburn (Intermittent mild). Negative for abdominal pain, blood in stool, constipation, diarrhea, melena, nausea and vomiting.  Genitourinary: Negative.   Musculoskeletal: Negative for joint pain and myalgias.  Skin: Negative for rash.   Neurological: Negative for dizziness, tingling, sensory change, weakness and headaches.  Endo/Heme/Allergies: Negative for polydipsia.  Psychiatric/Behavioral: Negative for depression, substance abuse and suicidal ideas. The patient is nervous/anxious. The patient does not have insomnia.   All other systems reviewed and are negative.   Physical Exam: BP 110/80   Pulse 71   Temp 98.1 F (36.7 C)   Ht 5' 9.5" (1.765 m)   Wt 215 lb (97.5 kg)   LMP 04/03/2017 (Approximate)   SpO2 97%   BMI 31.29 kg/m  Wt Readings from Last 3 Encounters:  04/24/17 215 lb (97.5 kg)  01/08/17 209 lb (94.8 kg)  10/03/16 207 lb 12.8 oz (94.3 kg)   General Appearance: Well nourished, in no apparent distress. Eyes: PERRLA, EOMs, conjunctiva no swelling or erythema Sinuses: No Frontal/maxillary tenderness ENT/Mouth: Ext aud canals clear, TMs without erythema, bulging. No erythema, swelling, or exudate on post pharynx.  Tonsils not swollen or erythematous. Hearing normal.  Neck: Supple, thyroid normal.  Respiratory: Respiratory effort normal, BS equal bilaterally without rales, rhonchi, wheezing or stridor.  Cardio: Tachycardia with no MRGs. Brisk peripheral pulses without edema.  Abdomen: Soft, + BS.  Non tender, no guarding, rebound, hernias, masses. Lymphatics: Non tender without  lymphadenopathy.  Musculoskeletal: Full ROM, 5/5 strength, Normal gait Skin: Warm, dry without rashes, lesions, ecchymosis.  Neuro: Cranial nerves intact. No cerebellar symptoms.  Psych: Awake and oriented X 3, normal affect, somewhat pressured speech and tearful at times, Insight and Judgment appropriate.    [name], NP 1:39 PM [facility]

## 2017-04-24 ENCOUNTER — Other Ambulatory Visit: Payer: Self-pay | Admitting: Physician Assistant

## 2017-04-24 ENCOUNTER — Ambulatory Visit (INDEPENDENT_AMBULATORY_CARE_PROVIDER_SITE_OTHER): Payer: 59 | Admitting: Adult Health

## 2017-04-24 ENCOUNTER — Encounter: Payer: Self-pay | Admitting: Adult Health

## 2017-04-24 VITALS — BP 110/80 | HR 71 | Temp 98.1°F | Ht 69.5 in | Wt 215.0 lb

## 2017-04-24 DIAGNOSIS — K2100 Gastro-esophageal reflux disease with esophagitis, without bleeding: Secondary | ICD-10-CM

## 2017-04-24 DIAGNOSIS — E079 Disorder of thyroid, unspecified: Secondary | ICD-10-CM | POA: Diagnosis not present

## 2017-04-24 DIAGNOSIS — E669 Obesity, unspecified: Secondary | ICD-10-CM

## 2017-04-24 DIAGNOSIS — E782 Mixed hyperlipidemia: Secondary | ICD-10-CM

## 2017-04-24 DIAGNOSIS — F5105 Insomnia due to other mental disorder: Secondary | ICD-10-CM

## 2017-04-24 DIAGNOSIS — F99 Mental disorder, not otherwise specified: Secondary | ICD-10-CM | POA: Diagnosis not present

## 2017-04-24 DIAGNOSIS — F411 Generalized anxiety disorder: Secondary | ICD-10-CM

## 2017-04-24 DIAGNOSIS — Z79899 Other long term (current) drug therapy: Secondary | ICD-10-CM | POA: Diagnosis not present

## 2017-04-24 DIAGNOSIS — D649 Anemia, unspecified: Secondary | ICD-10-CM | POA: Diagnosis not present

## 2017-04-24 LAB — HEPATIC FUNCTION PANEL
AG Ratio: 1.3 (calc) (ref 1.0–2.5)
ALKALINE PHOSPHATASE (APISO): 79 U/L (ref 33–115)
ALT: 26 U/L (ref 6–29)
AST: 27 U/L (ref 10–30)
Albumin: 4.3 g/dL (ref 3.6–5.1)
BILIRUBIN INDIRECT: 0.3 mg/dL (ref 0.2–1.2)
Bilirubin, Direct: 0.1 mg/dL (ref 0.0–0.2)
Globulin: 3.3 g/dL (calc) (ref 1.9–3.7)
TOTAL PROTEIN: 7.6 g/dL (ref 6.1–8.1)
Total Bilirubin: 0.4 mg/dL (ref 0.2–1.2)

## 2017-04-24 LAB — CBC WITH DIFFERENTIAL/PLATELET
BASOS PCT: 1.2 %
Basophils Absolute: 84 cells/uL (ref 0–200)
EOS ABS: 462 {cells}/uL (ref 15–500)
Eosinophils Relative: 6.6 %
HCT: 41.5 % (ref 35.0–45.0)
Hemoglobin: 14 g/dL (ref 11.7–15.5)
Lymphs Abs: 1610 cells/uL (ref 850–3900)
MCH: 29.5 pg (ref 27.0–33.0)
MCHC: 33.7 g/dL (ref 32.0–36.0)
MCV: 87.4 fL (ref 80.0–100.0)
MONOS PCT: 10.9 %
MPV: 11.5 fL (ref 7.5–12.5)
Neutro Abs: 4081 cells/uL (ref 1500–7800)
Neutrophils Relative %: 58.3 %
PLATELETS: 293 10*3/uL (ref 140–400)
RBC: 4.75 10*6/uL (ref 3.80–5.10)
RDW: 12 % (ref 11.0–15.0)
TOTAL LYMPHOCYTE: 23 %
WBC mixed population: 763 cells/uL (ref 200–950)
WBC: 7 10*3/uL (ref 3.8–10.8)

## 2017-04-24 LAB — BASIC METABOLIC PANEL WITH GFR
BUN: 12 mg/dL (ref 7–25)
CHLORIDE: 100 mmol/L (ref 98–110)
CO2: 27 mmol/L (ref 20–32)
Calcium: 9.7 mg/dL (ref 8.6–10.2)
Creat: 0.93 mg/dL (ref 0.50–1.10)
GFR, Est African American: 87 mL/min/{1.73_m2} (ref >=60)
GFR, Est Non African American: 75 mL/min/{1.73_m2} (ref >=60)
GLUCOSE: 86 mg/dL (ref 65–99)
POTASSIUM: 4.1 mmol/L (ref 3.5–5.3)
SODIUM: 137 mmol/L (ref 135–146)

## 2017-04-24 LAB — TSH: TSH: 3.34 mIU/L

## 2017-04-24 LAB — LIPID PANEL
CHOLESTEROL: 209 mg/dL — AB (ref <200)
HDL: 88 mg/dL (ref >50)
LDL Cholesterol (Calc): 102 mg/dL (calc) — ABNORMAL HIGH
Non-HDL Cholesterol (Calc): 121 mg/dL (calc) (ref <130)
Total CHOL/HDL Ratio: 2.4 (calc) (ref <5.0)
Triglycerides: 92 mg/dL (ref <150)

## 2017-04-24 MED ORDER — BUSPIRONE HCL 10 MG PO TABS: 10.0000 mg | ORAL_TABLET | Freq: Three times a day (TID) | ORAL | 2 refills | Status: DC

## 2017-04-24 MED ORDER — DILTIAZEM HCL ER 180 MG PO CP24: ORAL_CAPSULE | ORAL | 2 refills | Status: DC

## 2017-04-24 MED FILL — DILT XR 180 MG CAPSULE: 180 | 30 days supply | Qty: 30 | Fill #0

## 2017-04-24 MED FILL — LEVOTHYROXINE 112 MCG TABLE: 112 | 90 days supply | Qty: 90 | Fill #0

## 2017-04-24 MED FILL — busPIRone HCL 10 MG TABS: 10 | 30 days supply | Qty: 90 | Fill #0

## 2017-04-24 NOTE — Patient Instructions (Addendum)
Cognitive behavioral therapy / anxiety specialist to see if they have further ideas    Counseling services  Here are some numbers below you can try but I suggest calling your insurance and finding out who is in your network and THEN calling those people or looking them up on google.   I'm a big fan of Cognitive Behavioral Therapy, look this up on You tube or check with the therapist you see if they are certified.  This form of therapy helps to teach you skills to better handle with current situation that are causing anxiety or depression.   [facility] [place], NP [place].  [address] [phone] Fax [phone]  [facility] Hours: Monday-Thursday 830-8pm  Friday 830AM-7PM Address: [place] [phone].  Address: [place], [place] [address] [place] [facility] [phone] office [email or web address] [address] [place], [place], [place] [address]  Dr. Arbutus Leas, Ph.D. [address], [place] [address] Phone: [phone], [place] [phone] [place] [address] [place] [address]   Toy Cookey, MA, clinical psychologist  Cognitive-Behavior Therapy; Mood Disorders; Anxiety Disorders; adult and child ADHD; Family Therapy; Stress Management; personal growth, and Marital Therapy.    Terrance Mass Ph.D., clinical psychologist Cognitive-Behavior Therapy; Mood Disorders; Anxiety Disorders; Stress     Management  [facility] [address] [place], [place] [address] [phone]   [facility], psychotherapist [address] [place], [place] [address] [phone]  Karin Golden Ph.D., clinical psychologist [phone] office [place], [place] [address] Cognitive Behavior Therapy, Depression, Bipolar,  Anxiety, Grief and Loss     Buspirone tablets What is this medicine? BUSPIRONE (byoo SPYE rone) is used to treat anxiety disorders. This medicine may be used for other purposes; ask your health care provider or pharmacist if you have questions. COMMON BRAND NAME(S): BuSpar What should I tell my health care provider before I take this medicine? They need to know if you have any of these conditions: -kidney or liver disease -an unusual or allergic reaction to buspirone, other medicines, foods, dyes, or preservatives -pregnant or trying to get pregnant -breast-feeding How should I use this medicine? Take this medicine by mouth with a glass of water. Follow the directions on the prescription label. You may take this medicine with or without food. To ensure that this medicine always works the same way for you, you should take it either always with or always without food. Take your doses at regular intervals. Do not take your medicine more often than directed. Do not stop taking except on the advice of your doctor or health care professional. Talk to your pediatrician regarding the use of this medicine in children. Special care may be needed. Overdosage: If you think you have taken too much of this medicine contact a poison control center or emergency room at once. NOTE: This medicine is only for you. Do not share this medicine with others. What if I miss a dose? If you miss a dose, take it as soon as you can. If it is almost time for your next dose, take only that dose. Do not take double or extra doses. What may interact with this medicine? Do not take this medicine with any of the following medications: -linezolid -MAOIs like Carbex, Eldepryl, Marplan, Nardil, and Parnate -methylene blue -procarbazine This medicine may  also interact with the following medications: -diazepam -digoxin -diltiazem -erythromycin -grapefruit juice -haloperidol -medicines for mental depression or mood  problems -medicines for seizures like carbamazepine, phenobarbital and phenytoin -nefazodone -other medications for anxiety -rifampin -ritonavir -some antifungal medicines like itraconazole, ketoconazole, and voriconazole -verapamil -warfarin This list may not describe all possible interactions. Give your health care provider a list of all the medicines, herbs, non-prescription drugs, or dietary supplements you use. Also tell them if you smoke, drink alcohol, or use illegal drugs. Some items may interact with your medicine. What should I watch for while using this medicine? Visit your doctor or health care professional for regular checks on your progress. It may take 1 to 2 weeks before your anxiety gets better. You may get drowsy or dizzy. Do not drive, use machinery, or do anything that needs mental alertness until you know how this drug affects you. Do not stand or sit up quickly, especially if you are an older patient. This reduces the risk of dizzy or fainting spells. Alcohol can make you more drowsy and dizzy. Avoid alcoholic drinks. What side effects may I notice from receiving this medicine? Side effects that you should report to your doctor or health care professional as soon as possible: -blurred vision or other vision changes -chest pain -confusion -difficulty breathing -feelings of hostility or anger -muscle aches and pains -numbness or tingling in hands or feet -ringing in the ears -skin rash and itching -vomiting -weakness Side effects that usually do not require medical attention (report to your doctor or health care professional if they continue or are bothersome): -disturbed dreams, nightmares -headache -nausea -restlessness or nervousness -sore throat and nasal congestion -stomach upset This list may not describe all possible side effects. Call your doctor for medical advice about side effects. You may report side effects to FDA at 1-800-FDA-1088. Where should I  keep my medicine? Keep out of the reach of children. Store at room temperature below 30 degrees C (86 degrees F). Protect from light. Keep container tightly closed. Throw away any unused medicine after the expiration date. NOTE: This sheet is a summary. It may not cover all possible information. If you have questions about this medicine, talk to your doctor, pharmacist, or health care provider.  2018 Elsevier/Gold Standard (2009-12-13 18:06:11)

## 2017-04-27 ENCOUNTER — Encounter: Payer: Self-pay | Admitting: Adult Health

## 2017-04-30 ENCOUNTER — Ambulatory Visit: Payer: Self-pay | Admitting: Physician Assistant

## 2017-05-04 ENCOUNTER — Other Ambulatory Visit: Payer: Self-pay

## 2017-05-04 ENCOUNTER — Encounter: Payer: Self-pay | Admitting: Physician Assistant

## 2017-05-04 MED ORDER — OXYBUTYNIN CHLORIDE ER 10 MG PO TB24: 10.0000 mg | ORAL_TABLET | Freq: Every day | ORAL | 0 refills | Status: DC

## 2017-05-04 MED FILL — OXYBUTYNIN CL ER 10 MG TAB: 10 | 90 days supply | Qty: 90 | Fill #0

## 2017-05-15 MED FILL — ALPRAZolam 0.5 MG TABS: 0.5 | 30 days supply | Qty: 90 | Fill #1

## 2017-05-20 ENCOUNTER — Other Ambulatory Visit: Payer: 59

## 2017-05-20 ENCOUNTER — Other Ambulatory Visit: Payer: Self-pay

## 2017-05-20 ENCOUNTER — Encounter: Payer: Self-pay | Admitting: Physician Assistant

## 2017-05-20 DIAGNOSIS — R000 Tachycardia, unspecified: Secondary | ICD-10-CM | POA: Diagnosis not present

## 2017-05-23 LAB — CATECHOLAMINES, FRACTIONATED, URINE, 24 HOUR
CALCULATED TOTAL (E+ NE): 34 ug/(24.h) (ref 26–121)
Creatinine, Urine mg/day-CATEUR: 1.4 g/(24.h) (ref 0.50–2.15)
DOPAMINE, 24 HR URINE: 242 ug/(24.h) (ref 52–480)
Norepinephrine, 24 hr Ur: 34 mcg/24 h (ref 15–100)
Volume, Urine-VMAUR: 2900 mL

## 2017-05-24 ENCOUNTER — Encounter: Payer: Self-pay | Admitting: Adult Health

## 2017-05-24 ENCOUNTER — Other Ambulatory Visit: Payer: Self-pay | Admitting: Adult Health

## 2017-05-24 DIAGNOSIS — R000 Tachycardia, unspecified: Secondary | ICD-10-CM

## 2017-05-24 DIAGNOSIS — R079 Chest pain, unspecified: Secondary | ICD-10-CM

## 2017-05-24 MED ORDER — ESCITALOPRAM OXALATE 20 MG PO TABS: 20.0000 mg | ORAL_TABLET | Freq: Every day | ORAL | 1 refills | Status: DC

## 2017-05-24 NOTE — Progress Notes (Signed)
Patient reports via MyChart she is not tolerating cardizem or bystolic for ongoing intermittent tachycardia (failed multiple other agents previously) and has stopped; also reports ongoing intermittent chest pains with dyspnea. Will see her back in office at first opportunity (05/28/16) and obtain EKG and discuss Holter but will proceed with cardiology referral after discussion with patient.

## 2017-05-26 ENCOUNTER — Encounter: Payer: Self-pay | Admitting: Internal Medicine

## 2017-05-27 NOTE — Progress Notes (Signed)
Assessment and Plan:  Shari Payne was seen today for follow-up.  Diagnoses and all orders for this visit:  Gastroesophageal reflux disease with esophagitis -     omeprazole (PRILOSEC) 20 MG capsule; Take 1 capsule (20 mg total) by mouth 2 (two) times daily.       Try peppermint oil/ pepper mint altoids x2 sublingually prior to meals for possible esophageal spasms       Tachycardia/Intermittent palpitations -     HOLTER MONITOR - 48 HOUR; Future Consider stress test, ?ECHO if it takes several weeks to get into cardiology with no improvement in symptoms Keep cardiology appointment  Generalized anxiety disorder        Continue lexapro 20 mg - tolerating well         Benefit noted with buspar - increase to 15 mg TID  Further disposition pending results of labs. Discussed med's effects and SE's.   Over 30 minutes of exam, counseling, chart review, and critical decision making was performed.   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 Cardiology referral was placed and pending; she was able to have an EKG done at work prior to this visit - reveiwed, unremarkable.   Bisoprolol - didn't help anxiety Metoprolol tartrate - palpitations, skipped beats,  sense of arrhythmia, didn't help anxiety, rebound tachy issues Verapamil - full body rash cardizem - tolerated 120 mg but with HR still elevated, poorly tolerated 240 mg and 180 mg - chest discomfort Bistolic helped tachycardia/anxiety but consistently caused severe headaches  She began having "chest pain" episodes when she started on diltiazem - 120 mg with "twinging" left sided chest pain with mild dyspnea. She reports she wasn't terribly concerned, thought was just a SE but was experiencing relief from anxiety/tachycardia so wanted to try increased dose. Chest pains became worse. She reports episodes are associated with anxiety episodes. She does report she is doing well with lexapro 20 mg with addition of buspar  She also has hx of GERD, though is not currently taking PPI after previously being tapered off; currently taking ranitidine 150 mg PRN. Discussed possible esophageal spasms being related as she experiences much of her anxiety related to food. She also discusses she has had several episodes of clenching retrosternal chest pain as well.   She was started on buspar 10 mg TID in addition to increase of lexapro to 20 mg at last visit; she reports she has noted benefit with buspar.   Past Medical History:  Diagnosis Date  . Anxiety   . Breast hypertrophy 03/2014  . GERD (gastroesophageal reflux disease)   . Hypothyroidism   . Interstitial cystitis      Allergies  Allergen Reactions  . Amoxicillin Rash and Other (See Comments)    FEVER  . Bisoprolol Anxiety  . Metoprolol Tartrate  Palpitations  . Verapamil Rash    Current Outpatient Medications on File Prior to Visit  Medication Sig  . ALPRAZolam (XANAX) 0.5 MG tablet Take 1 tablet (0.5 mg total) by mouth 3 (three) times daily as needed for anxiety.  . busPIRone (BUSPAR) 10 MG tablet Take 1 tablet (10 mg total) by mouth 3 (three) times daily.  . Cholecalciferol (VITAMIN D PO) Take 4,000 Units by mouth.  . cyclobenzaprine  (FLEXERIL) 10 MG tablet Take 1 tablet (10 mg total) by mouth every 8 (eight) hours as needed for muscle spasms.  Marland Kitchen escitalopram (LEXAPRO) 20 MG tablet Take 1 tablet (20 mg total) by mouth daily.  Marland Kitchen levothyroxine (SYNTHROID, LEVOTHROID) 112 MCG tablet TAKE 1 TABLET BY MOUTH ONCE DAILY BEFORE BREAKFAST.  Marland Kitchen oxybutynin (DITROPAN XL) 10 MG 24 hr tablet Take 1 tablet (10 mg total) by mouth at bedtime.  Marland Kitchen amitriptyline (ELAVIL) 25 MG tablet TAKE 1 TABLET BY MOUTH 3 TIMES DAILY AS NEEDED (Patient not taking: Reported on 05/28/2017)  . diltiazem (DILACOR XR) 180 MG 24 hr capsule Take 1 capsule by mouth daily (Patient not taking: Reported on 05/28/2017)   No current facility-administered medications on file prior to visit.     ROS: all negative except above.   Physical Exam:  BP 124/78   Pulse 96   Temp 97.7 F (36.5 C)   Ht 5' 9.5" (1.765 m)   Wt 210 lb (95.3 kg)   SpO2 94%   BMI 30.57 kg/m   General Appearance: Well nourished, in no apparent distress. Eyes: PERRLA, EOMs, conjunctiva no swelling or erythema Sinuses: No Frontal/maxillary tenderness ENT/Mouth: Ext aud canals clear, TMs without erythema, bulging. No erythema, swelling, or exudate on post pharynx.  Tonsils not swollen or erythematous. Hearing normal.  Neck: Supple, thyroid normal.  Respiratory: Respiratory effort normal, BS equal bilaterally without rales, rhonchi, wheezing or stridor.  Cardio: RRR with no MRGs. Brisk peripheral pulses without edema.  Abdomen: Soft, + BS.  Non tender, no guarding, rebound, hernias, masses. Lymphatics: Non tender without lymphadenopathy.  Musculoskeletal: Full ROM, 5/5 strength, normal gait.  Skin: Warm, dry without rashes, lesions, ecchymosis.  Neuro: Cranial nerves intact. Normal muscle tone, no cerebellar symptoms. Sensation intact.  Psych: Awake and oriented X 3, normal affect, Insight and Judgment appropriate.     [name], NP 12:30 PM [facility]

## 2017-05-28 ENCOUNTER — Encounter: Payer: Self-pay | Admitting: Adult Health

## 2017-05-28 ENCOUNTER — Ambulatory Visit (INDEPENDENT_AMBULATORY_CARE_PROVIDER_SITE_OTHER): Payer: 59 | Admitting: Adult Health

## 2017-05-28 VITALS — BP 124/78 | HR 96 | Temp 97.7°F | Ht 69.5 in | Wt 210.0 lb

## 2017-05-28 DIAGNOSIS — R000 Tachycardia, unspecified: Secondary | ICD-10-CM

## 2017-05-28 DIAGNOSIS — R002 Palpitations: Secondary | ICD-10-CM | POA: Diagnosis not present

## 2017-05-28 DIAGNOSIS — K2100 Gastro-esophageal reflux disease with esophagitis, without bleeding: Secondary | ICD-10-CM

## 2017-05-28 DIAGNOSIS — F411 Generalized anxiety disorder: Secondary | ICD-10-CM

## 2017-05-28 MED ORDER — OMEPRAZOLE 20 MG PO CPDR: 20.0000 mg | DELAYED_RELEASE_CAPSULE | Freq: Two times a day (BID) | ORAL | 1 refills | Status: DC

## 2017-05-28 MED FILL — OMEPRAZOLE 20 MG CAP: 20 | 90 days supply | Qty: 180 | Fill #0

## 2017-05-28 MED FILL — ESCITALOPRAM 20 MG TABLET: 20 | 90 days supply | Qty: 90 | Fill #0

## 2017-05-29 MED FILL — busPIRone HCL 10 MG TABS: 10 | 30 days supply | Qty: 90 | Fill #1

## 2017-06-03 ENCOUNTER — Ambulatory Visit (INDEPENDENT_AMBULATORY_CARE_PROVIDER_SITE_OTHER): Payer: 59

## 2017-06-03 DIAGNOSIS — R000 Tachycardia, unspecified: Secondary | ICD-10-CM | POA: Diagnosis not present

## 2017-06-03 DIAGNOSIS — R002 Palpitations: Secondary | ICD-10-CM | POA: Diagnosis not present

## 2017-06-10 ENCOUNTER — Encounter: Payer: Self-pay | Admitting: Adult Health

## 2017-07-10 MED FILL — busPIRone HCL 10 MG TABS: 10 | 30 days supply | Qty: 90 | Fill #2

## 2017-07-13 ENCOUNTER — Other Ambulatory Visit: Payer: Self-pay | Admitting: Physician Assistant

## 2017-07-13 MED FILL — ALPRAZolam 0.5 MG TABS: 0.5 | 30 days supply | Qty: 90 | Fill #0

## 2017-07-13 MED FILL — LEVOTHYROXINE 112 MCG TAB: 112 | 90 days supply | Qty: 90 | Fill #1

## 2017-07-14 ENCOUNTER — Other Ambulatory Visit: Payer: Self-pay | Admitting: Obstetrics and Gynecology

## 2017-07-14 DIAGNOSIS — R928 Other abnormal and inconclusive findings on diagnostic imaging of breast: Secondary | ICD-10-CM

## 2017-08-10 MED FILL — OXYBUTYNIN CL ER 10 MG TAB: 10 | 30 days supply | Qty: 30 | Fill #0

## 2017-08-17 ENCOUNTER — Other Ambulatory Visit: Payer: Self-pay | Admitting: Adult Health

## 2017-08-17 DIAGNOSIS — F411 Generalized anxiety disorder: Secondary | ICD-10-CM

## 2017-08-17 MED FILL — ESCITALOPRAM 20 MG TABLET: 20 | 90 days supply | Qty: 90 | Fill #1

## 2017-08-17 MED FILL — busPIRone HCL 5 MG TABS: 5 | 30 days supply | Qty: 180 | Fill #0

## 2017-08-17 MED FILL — OMEPRAZOLE 20 MG CAP: 20 | 90 days supply | Qty: 180 | Fill #1

## 2017-09-07 ENCOUNTER — Other Ambulatory Visit: Payer: Self-pay | Admitting: Obstetrics and Gynecology

## 2017-09-07 ENCOUNTER — Ambulatory Visit
Admission: RE | Admit: 2017-09-07 | Discharge: 2017-09-07 | Disposition: A | Discharge status: Home / Self Care | Payer: 59 | Source: Ambulatory Visit | Attending: Obstetrics and Gynecology | Admitting: Obstetrics and Gynecology

## 2017-09-07 DIAGNOSIS — R928 Other abnormal and inconclusive findings on diagnostic imaging of breast: Secondary | ICD-10-CM | POA: Diagnosis not present

## 2017-09-07 DIAGNOSIS — N6489 Other specified disorders of breast: Secondary | ICD-10-CM

## 2017-09-15 MED FILL — OXYBUTYNIN CL ER 10 MG TAB: 10 | 30 days supply | Qty: 30 | Fill #0

## 2017-10-07 ENCOUNTER — Encounter: Payer: Self-pay | Admitting: Adult Health

## 2017-10-09 ENCOUNTER — Other Ambulatory Visit: Payer: Self-pay | Admitting: Adult Health

## 2017-10-09 ENCOUNTER — Encounter: Payer: Self-pay | Admitting: Adult Health

## 2017-10-09 ENCOUNTER — Encounter: Payer: Self-pay | Admitting: Physician Assistant

## 2017-10-09 MED ORDER — METOPROLOL SUCCINATE ER 25 MG PO TB24: 25.0000 mg | ORAL_TABLET | Freq: Every day | ORAL | 1 refills | Status: DC

## 2017-10-09 MED FILL — METOPROLOL SUCCINATE ER 25: 25 | 30 days supply | Qty: 30 | Fill #0

## 2017-10-15 ENCOUNTER — Telehealth: Payer: Self-pay

## 2017-10-15 ENCOUNTER — Other Ambulatory Visit: Payer: Self-pay | Admitting: Adult Health

## 2017-10-15 MED ORDER — OXYBUTYNIN CHLORIDE ER 10 MG PO TB24: 10.0000 mg | ORAL_TABLET | Freq: Every day | ORAL | 1 refills | Status: DC

## 2017-10-15 MED FILL — OXYBUTYNIN CL ER 10 MG TAB: 10 | 90 days supply | Qty: 90 | Fill #1

## 2017-10-15 NOTE — Telephone Encounter (Signed)
Had to reschedule her CPE due to [place] leave. Has her CPE scheduled with you on 12/01/17, which is the first available. Requesting a refill for Oxybutynin, 90 day supply.

## 2017-10-19 MED FILL — ALPRAZolam 0.5 MG TABS: 0.5 | 30 days supply | Qty: 90 | Fill #1

## 2017-10-19 MED FILL — LEVOTHYROXINE 112 MCG TAB: 112 | 90 days supply | Qty: 90 | Fill #2

## 2017-11-02 ENCOUNTER — Encounter: Payer: Self-pay | Admitting: Cardiology

## 2017-11-02 ENCOUNTER — Ambulatory Visit: Payer: 59 | Admitting: Cardiology

## 2017-11-02 VITALS — BP 134/86 | HR 72 | Ht 68.0 in | Wt 212.0 lb

## 2017-11-02 DIAGNOSIS — R0600 Dyspnea, unspecified: Secondary | ICD-10-CM | POA: Diagnosis not present

## 2017-11-02 DIAGNOSIS — R000 Tachycardia, unspecified: Secondary | ICD-10-CM

## 2017-11-02 DIAGNOSIS — Z720 Tobacco use: Secondary | ICD-10-CM | POA: Diagnosis not present

## 2017-11-02 DIAGNOSIS — R002 Palpitations: Secondary | ICD-10-CM | POA: Diagnosis not present

## 2017-11-02 MED ORDER — METOPROLOL SUCCINATE ER 50 MG PO TB24: 50.0000 mg | ORAL_TABLET | Freq: Every day | ORAL | 3 refills | Status: DC

## 2017-11-02 MED ORDER — VARENICLINE TARTRATE 0.5 MG X 11 & 1 MG X 42 PO MISC: ORAL | 0 refills | Status: DC

## 2017-11-02 MED FILL — METOPROLOL SUCCINATE ER 50: 50 | 90 days supply | Qty: 90 | Fill #0

## 2017-11-02 MED FILL — CHANTIX STARTING MONTH BOX: 0.5 MG X 11 | 28 days supply | Qty: 53 | Fill #0

## 2017-11-02 NOTE — Progress Notes (Signed)
Verlan Friends MD Reason for referral-Dyspnea and palpitations  HPI: 47 year old female for evaluation of dyspnea and palpitations at request of Unk Pinto MD.  Holter monitor January 2019 showed sinus rhythm with occasional PACs, brief PAT and rare PVC.  Patient states that she has had episodes of dyspnea over the past 6 months and potentially longer.  She recently was in the office having dyspnea and they correlated with PVCs on electrocardiogram.  She otherwise does not have significant dyspnea on exertion, orthopnea, PND, pedal edema, exertional chest pain or syncope.  She does have some neck and back pain that lasts 30 to 45 minutes that is not positional and not related to exertion.  Because of the above cardiology asked to evaluate.  Current Outpatient Medications  Medication Sig Dispense Refill  . ALPRAZolam (XANAX) 0.5 MG tablet TAKE 1 TABLET BY MOUTH 3 TIMES PER DAY AS NEEDED FOR ANXIETY. 90 tablet 1  . busPIRone (BUSPAR) 10 MG tablet TAKE 1 TABLET BY MOUTH THREE TIMES DAILY. 90 tablet 2  . Cholecalciferol (VITAMIN D PO) Take 4,000 Units by mouth.    . cyclobenzaprine (FLEXERIL) 10 MG tablet Take 1 tablet (10 mg total) by mouth every 8 (eight) hours as needed for muscle spasms. 60 tablet 1  . escitalopram (LEXAPRO) 20 MG tablet Take 1 tablet (20 mg total) by mouth daily. 90 tablet 1  . levothyroxine (SYNTHROID, LEVOTHROID) 112 MCG tablet TAKE 1 TABLET BY MOUTH ONCE DAILY BEFORE BREAKFAST. 90 tablet 4  . metoprolol succinate (TOPROL-XL) 25 MG 24 hr tablet Take 1 tablet (25 mg total) by mouth daily. Take with or immediately following a meal. 30 tablet 1  . omeprazole (PRILOSEC) 20 MG capsule Take 1 capsule (20 mg total) by mouth 2 (two) times daily. 180 capsule 1  . oxybutynin (DITROPAN XL) 10 MG 24 hr tablet Take 1 tablet (10 mg total) by mouth at bedtime. 90 tablet 1   No current facility-administered medications for this visit.     Allergies  Allergen Reactions    . Amoxicillin Rash and Other (See Comments)    FEVER  . Bisoprolol Anxiety  . Metoprolol Tartrate Palpitations  . Verapamil Rash     Past Medical History:  Diagnosis Date  . Anxiety   . Breast hypertrophy 03/2014  . GERD (gastroesophageal reflux disease)   . Hyperlipidemia   . Hypothyroidism   . Interstitial cystitis     Past Surgical History:  Procedure Laterality Date  . BREAST BIOPSY Left   . BREAST REDUCTION SURGERY Bilateral 04/18/2014   Procedure: MAMMARY REDUCTION  (BREAST) BILATERAL;  Surgeon: Charlene Brooke, MD;  Location: [facility];  Service: Plastics;  Laterality: Bilateral;    Social History   Socioeconomic History  . Marital status: Single    Spouse name: Not on file  . Number of children: 1  . Years of education: Not on file  . Highest education level: Not on file  Occupational History  . Not on file  Social Needs  . Financial resource strain: Not on file  . Food insecurity:    Worry: Not on file    Inability: Not on file  . Transportation needs:    Medical: Not on file    Non-medical: Not on file  Tobacco Use  . Smoking status: Current Every Day Smoker    Last attempt to quit: 05/18/2012    Years since quitting: 5.4  . Smokeless tobacco: Never Used  Substance and Sexual Activity  .  Alcohol use: Yes    Comment: weekends  . Drug use: No  . Sexual activity: Not on file  Lifestyle  . Physical activity:    Days per week: Not on file    Minutes per session: Not on file  . Stress: Not on file  Relationships  . Social connections:    Talks on phone: Not on file    Gets together: Not on file    Attends religious service: Not on file    Active member of club or organization: Not on file    Attends meetings of clubs or organizations: Not on file    Relationship status: Not on file  . Intimate partner violence:    Fear of current or ex partner: Not on file    Emotionally abused: Not on file    Physically abused: Not on file     Forced sexual activity: Not on file  Other Topics Concern  . Not on file  Social History Narrative  . Not on file    Family History  Problem Relation Age of Onset  . CVA Maternal Grandfather   . Breast cancer Neg Hx     ROS: no fevers or chills, productive cough, hemoptysis, dysphasia, odynophagia, melena, hematochezia, dysuria, hematuria, rash, seizure activity, orthopnea, PND, pedal edema, claudication. Remaining systems are negative.  Physical Exam:   Blood pressure 134/86, pulse 72, height 5\' 8"  (1.727 m), weight 212 lb (96.2 kg).  General:  Well developed/well nourished in NAD Skin warm/dry Patient not depressed No peripheral clubbing Back-normal HEENT-normal/normal eyelids Neck supple/normal carotid upstroke bilaterally; no bruits; no JVD; no thyromegaly chest - CTA/ normal expansion CV - RRR/normal S1 and S2; no murmurs, rubs or gallops;  PMI nondisplaced Abdomen -NT/ND, no HSM, no mass, + bowel sounds, no bruit 2+ femoral pulses, no bruits Ext-no edema, chords, 2+ DP Neuro-grossly nonfocal  ECG -sinus arrhythmia with no ST changes.  Personally reviewed  A/P  1 palpitations/dyspnea-patient was in the office recently and correlated her dyspnea with palpitations.  Toprol was initiated at 25 mg daily with some improvement but they have not completely resolved.  I will increase her Toprol to 50 mg daily.  I will arrange an echocardiogram to assess LV function.  She also noted that her palpitations worsen with increased caffeine and alcohol and she will reduce consumption.  2 dyspnea-as outlined we will arrange an echocardiogram to further assess LV function.  3 tobacco abuse-we discussed the importance of discontinuing.  I have provided a prescription for Chantix.  Kirk Ruths, MD

## 2017-11-02 NOTE — Patient Instructions (Signed)
Medication Instructions:   START CHANTIX AS DIRECTED  INCREASE METOPROLOL TO 50 MG ONCE DAILY= 2 OF THE 25 MG TABLETS ONCE DAILY  Testing/Procedures:  Your physician has requested that you have an echocardiogram. Echocardiography is a painless test that uses sound waves to create images of your heart. It provides your doctor with information about the size and shape of your heart and how well your heart's chambers and valves are working. This procedure takes approximately one hour. There are no restrictions for this procedure.    Follow-Up:  Your physician wants you to follow-up in: [place] will receive a reminder letter in the mail two months in advance. If you don't receive a letter, please call our office to schedule the follow-up appointment.

## 2017-11-05 ENCOUNTER — Other Ambulatory Visit (HOSPITAL_COMMUNITY): Payer: 59

## 2017-11-06 ENCOUNTER — Other Ambulatory Visit: Payer: Self-pay | Admitting: Cardiology

## 2017-11-06 DIAGNOSIS — R000 Tachycardia, unspecified: Secondary | ICD-10-CM

## 2017-11-06 DIAGNOSIS — R0600 Dyspnea, unspecified: Secondary | ICD-10-CM

## 2017-11-06 DIAGNOSIS — I493 Ventricular premature depolarization: Secondary | ICD-10-CM

## 2017-11-10 ENCOUNTER — Ambulatory Visit (INDEPENDENT_AMBULATORY_CARE_PROVIDER_SITE_OTHER): Payer: 59

## 2017-11-10 ENCOUNTER — Other Ambulatory Visit (HOSPITAL_COMMUNITY): Payer: 59

## 2017-11-10 ENCOUNTER — Other Ambulatory Visit: Payer: Self-pay

## 2017-11-10 DIAGNOSIS — R0600 Dyspnea, unspecified: Secondary | ICD-10-CM | POA: Diagnosis not present

## 2017-11-10 DIAGNOSIS — I493 Ventricular premature depolarization: Secondary | ICD-10-CM | POA: Diagnosis not present

## 2017-11-10 DIAGNOSIS — R000 Tachycardia, unspecified: Secondary | ICD-10-CM | POA: Diagnosis not present

## 2017-11-12 ENCOUNTER — Encounter: Payer: Self-pay | Admitting: Cardiology

## 2017-11-30 ENCOUNTER — Ambulatory Visit: Payer: 59 | Admitting: Cardiology

## 2017-11-30 ENCOUNTER — Other Ambulatory Visit: Payer: Self-pay | Admitting: Adult Health

## 2017-11-30 MED FILL — ESCITALOPRAM 20 MG TABLET: 20 | 90 days supply | Qty: 90 | Fill #0

## 2017-11-30 NOTE — Progress Notes (Signed)
Complete Physical  Assessment and Plan:  Encounter for general adult medical examination with abnormal findings  Tachycardia Continue to cut down on caffeine, ETOH, smoking Continue toprol; symptoms have improved/resolved  Gastroesophageal reflux disease with esophagitis Continue PPI/H2 blocker, diet discussed  Thyroid disease Hypothyroidism-check TSH level, continue medications the same, reminded to take on an empty stomach 30-18mins before food.  -     TSH  Interstitial cystitis Avoid triggers  Anemia, unspecified type - monitor, continue iron supp with Vitamin C and increase green leafy veggies -     CBC with Differential/Platelet -     Vitamin B12  Vitamin D deficiency -     VITAMIN D 25 Hydroxy (Vit-D Deficiency, Fractures)  Generalized anxiety disorder/insomnia continue medications, stress management techniques discussed, increase water, good sleep hygiene discussed, increase exercise, and increase veggies.   Medication management -     CMP/GFR -     Magnesium  Hyperlipidemia -     Lipid panel  Screening for blood or protein in urine -     Urinalysis, Routine w reflex microscopic -     Microalbumin / creatinine urine ratio  Screening diabetes      -      A1C  Discussed med's effects and SE's. Screening labs and tests as requested with regular follow-up as recommended. Over 40 minutes of exam, counseling, chart review, and critical decision making was performed this visit.   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 She has Anemia; Thyroid disease; Vitamin D deficiency; Interstitial cystitis; Generalized anxiety disorder; GERD (gastroesophageal reflux disease); Insomnia; Hyperlipidemia; Obesity (BMI 30.0-34.9); and Tachycardia on their problem list.   She is following  with her OB/GYN, Dr. Melba Coon, for ICS and is on amitriptyline, will take 25 mg nightly and will do 75 mg very rare for flares.   She has had ongoing tachycardia/palpitations and was having dyspnea associated with this and was worked up by cardiology; Holter showed some PVCs/PACs, ECHO from 10/2017 was unremarkable. She is cutting back on caffeine/ETOH and quit smoking by chantix. She is on toprol with improvement in symptoms.   She has been prescribed xanax PRN for anxiety; she currently uses 0.25 mg at night if needed to sleep. She uses buspar during the day as needed only and doing well.   BMI is Body mass index is 32.84 kg/m., she has been working on diet and exercise. Wt Readings from Last 3 Encounters:  12/01/17 216 lb (98 kg)  11/02/17 212 lb (96.2 kg)  05/28/17 210 lb (95.3 kg)   Her blood pressure has been controlled at home, today their BP is BP: 112/70 She does workout. She denies chest pain, shortness of breath, dizziness.   She is not on cholesterol medication and denies myalgias. Her cholesterol is at goal. The cholesterol last visit was:   Lab Results  Component Value Date   CHOL 209 (H) 04/24/2017   HDL 88 04/24/2017   LDLCALC 102 (H) 04/24/2017   TRIG 92 04/24/2017   CHOLHDL 2.4 04/24/2017   Last A1C in the office was:   Lab Results  Component Value Date   HGBA1C 5.6 08/15/2014   Patient is on Vitamin D supplement Lab Results  Component Value Date   VD25OH 35 10/03/2016   She is on thyroid  medication. Her medication was not changed last visit.   Lab Results  Component Value Date   TSH 3.34 04/24/2017     Current Medications:  Current Outpatient Medications on File Prior to Visit  Medication Sig Dispense Refill  . ALPRAZolam (XANAX) 0.5 MG tablet TAKE 1 TABLET BY MOUTH 3 TIMES PER DAY AS NEEDED FOR ANXIETY. 90 tablet 1  . busPIRone (BUSPAR) 10 MG tablet TAKE 1 TABLET BY MOUTH THREE TIMES DAILY. (Patient taking differently: TAKE 1 TABLET BY MOUTH THREE TIMES  DAILY PRN) 90 tablet 2  . Cholecalciferol (VITAMIN D PO) Take 4,000 Units by mouth.    . cyclobenzaprine (FLEXERIL) 10 MG tablet Take 1 tablet (10 mg total) by mouth every 8 (eight) hours as needed for muscle spasms. 60 tablet 1  . escitalopram (LEXAPRO) 20 MG tablet TAKE 1 TABLET BY MOUTH ONCE DAILY 90 tablet 1  . levothyroxine (SYNTHROID, LEVOTHROID) 112 MCG tablet TAKE 1 TABLET BY MOUTH ONCE DAILY BEFORE BREAKFAST. 90 tablet 4  . metoprolol succinate (TOPROL-XL) 50 MG 24 hr tablet Take 1 tablet (50 mg total) by mouth daily. Take with or immediately following a meal. 90 tablet 3  . omeprazole (PRILOSEC) 20 MG capsule Take 1 capsule (20 mg total) by mouth 2 (two) times daily. (Patient taking differently: Take 20 mg by mouth as needed. ) 180 capsule 1  . oxybutynin (DITROPAN XL) 10 MG 24 hr tablet Take 1 tablet (10 mg total) by mouth at bedtime. 90 tablet 1  . varenicline (CHANTIX STARTING MONTH PAK) 0.5 MG X 11 & 1 MG X 42 tablet Take one 0.5 mg tablet by mouth once daily for 3 days, then increase to one 0.5 mg tablet twice daily for 4 days, then increase to one 1 mg tablet twice daily. 53 tablet 0   No current facility-administered medications on file prior to visit.    Health Maintenance:    Immunization History  Administered Date(s) Administered  . Influenza, Seasonal, Injecte, Preservative Fre 02/26/2016  . 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 Pap: Dr. Melba Coon, 09/2015 MGM: 08/2017  DEXA: N/A Colonoscopy:N/A EGD: N/A CXR 08/2013  Last Dental Exam: Dr. Marland Kitchen 2019, q58m Last Eye Exam: Dr. Alois Cliche, 2018, wears glasses  Patient Care Team: Unk Pinto, MD as PCP - General (Internal Medicine)  Medical History:  Past Medical History:  Diagnosis Date  . Anxiety   . Breast hypertrophy 03/2014  . GERD (gastroesophageal reflux disease)    . Hyperlipidemia   . Hypothyroidism   . Interstitial cystitis    Allergies Allergies  Allergen Reactions  . Amoxicillin Rash and Other (See Comments)    FEVER  . Bisoprolol Anxiety  . Metoprolol Tartrate Palpitations  . Verapamil Rash    SURGICAL HISTORY She  has a past surgical history that includes Breast reduction surgery (Bilateral, 04/18/2014) and Breast biopsy (Left). FAMILY HISTORY Her family history includes CVA (age of onset: 78) in her maternal grandfather; Heart failure in her paternal grandfather; Osteosarcoma (age of onset: 6) in her sister. SOCIAL HISTORY She  reports that she quit smoking 10 days ago. She has never used smokeless tobacco. She reports that she drinks alcohol. She reports that she does not use drugs.  Review of Systems: Review of Systems  Constitutional: Negative.  Negative for malaise/fatigue and weight loss.  HENT: Negative.  Negative for hearing  loss and tinnitus.   Eyes: Negative.  Negative for blurred vision and double vision.  Respiratory: Negative.  Negative for cough, sputum production, shortness of breath and wheezing.   Cardiovascular: Negative.  Negative for chest pain, palpitations, orthopnea, claudication, leg swelling and PND.  Gastrointestinal: Negative for abdominal pain, blood in stool, constipation, diarrhea, heartburn, melena, nausea and vomiting.  Genitourinary: Negative for dysuria, flank pain, frequency, hematuria and urgency.  Musculoskeletal: Negative.  Negative for falls, joint pain and myalgias.  Skin: Negative.  Negative for rash.  Neurological: Negative.  Negative for dizziness, tingling, sensory change, weakness and headaches.  Endo/Heme/Allergies: Negative for polydipsia.  Psychiatric/Behavioral: Negative for depression, memory loss, substance abuse and suicidal ideas. The patient is not nervous/anxious and does not have insomnia.   All other systems reviewed and are negative.   Physical Exam: Estimated body mass  index is 32.84 kg/m as calculated from the following:   Height as of this encounter: 5\' 8"  (1.727 m).   Weight as of this encounter: 216 lb (98 kg). BP 112/70   Pulse 74   Temp 97.7 F (36.5 C)   Ht 5\' 8"  (1.727 m)   Wt 216 lb (98 kg)   LMP 11/05/2017   SpO2 97%   BMI 32.84 kg/m   Wt Readings from Last 3 Encounters:  12/01/17 216 lb (98 kg)  11/02/17 212 lb (96.2 kg)  05/28/17 210 lb (95.3 kg)   General Appearance: Well nourished, in no apparent distress.  Eyes: PERRLA, EOMs, conjunctiva no swelling or erythema, normal fundi and vessels.  Sinuses: No Frontal/maxillary tenderness  ENT/Mouth: Ext aud canals clear, normal light reflex with TMs without erythema, bulging. Good dentition. No erythema, swelling, or exudate on post pharynx. Tonsils not swollen or erythematous. Hearing normal.  Neck: Supple, thyroid normal. No bruits  Respiratory: Respiratory effort normal, BS equal bilaterally without rales, rhonchi, wheezing or stridor.  Cardio: RRR without murmurs, rubs or gallops. Brisk peripheral pulses without edema.  Chest: symmetric, with normal excursions and percussion.  Breasts: defer to GYN Abdomen: Soft, nontender, no guarding, rebound, hernias, masses, or organomegaly.  Lymphatics: Non tender without lymphadenopathy.  Genitourinary: defer to GYN Musculoskeletal: Full ROM all peripheral extremities,5/5 strength, and normal gait.  Skin: Warm, dry without rashes, lesions, ecchymosis. Neuro: Cranial nerves intact, reflexes equal bilaterally. Normal muscle tone, no cerebellar symptoms. Sensation intact.  Psych: Awake and oriented X 3, normal affect, Insight and Judgment appropriate.   EKG: defer, normal 10/2017 at cardiology  Shari Payne 9:22 AM [facility]

## 2017-12-01 ENCOUNTER — Ambulatory Visit (INDEPENDENT_AMBULATORY_CARE_PROVIDER_SITE_OTHER): Payer: 59 | Admitting: Adult Health

## 2017-12-01 ENCOUNTER — Encounter: Payer: Self-pay | Admitting: Adult Health

## 2017-12-01 VITALS — BP 112/70 | HR 74 | Temp 97.7°F | Ht 68.0 in | Wt 216.0 lb

## 2017-12-01 DIAGNOSIS — E559 Vitamin D deficiency, unspecified: Secondary | ICD-10-CM | POA: Diagnosis not present

## 2017-12-01 DIAGNOSIS — Z0000 Encounter for general adult medical examination without abnormal findings: Secondary | ICD-10-CM | POA: Diagnosis not present

## 2017-12-01 DIAGNOSIS — E782 Mixed hyperlipidemia: Secondary | ICD-10-CM | POA: Diagnosis not present

## 2017-12-01 DIAGNOSIS — Z131 Encounter for screening for diabetes mellitus: Secondary | ICD-10-CM

## 2017-12-01 DIAGNOSIS — Z1389 Encounter for screening for other disorder: Secondary | ICD-10-CM | POA: Diagnosis not present

## 2017-12-01 DIAGNOSIS — F5105 Insomnia due to other mental disorder: Secondary | ICD-10-CM

## 2017-12-01 DIAGNOSIS — K2100 Gastro-esophageal reflux disease with esophagitis, without bleeding: Secondary | ICD-10-CM

## 2017-12-01 DIAGNOSIS — R000 Tachycardia, unspecified: Secondary | ICD-10-CM

## 2017-12-01 DIAGNOSIS — E079 Disorder of thyroid, unspecified: Secondary | ICD-10-CM

## 2017-12-01 DIAGNOSIS — D649 Anemia, unspecified: Secondary | ICD-10-CM

## 2017-12-01 DIAGNOSIS — F411 Generalized anxiety disorder: Secondary | ICD-10-CM

## 2017-12-01 DIAGNOSIS — E669 Obesity, unspecified: Secondary | ICD-10-CM

## 2017-12-01 DIAGNOSIS — N3010 Interstitial cystitis (chronic) without hematuria: Secondary | ICD-10-CM

## 2017-12-01 DIAGNOSIS — F99 Mental disorder, not otherwise specified: Secondary | ICD-10-CM

## 2017-12-01 NOTE — Patient Instructions (Signed)
Aim to get your weight <200 lb for end of the year goal   Aim for 7+ servings of fruits and vegetables daily  80+ fluid ounces of water or unsweet tea for healthy kidneys  Try to limit to 1-2 drinks of alcohol per day, avoid smoking  Limit animal fats in diet for cholesterol and heart health - choose grass fed whenever available  Aim for low stress - take time to unwind and care for your mental health  Aim for 150 min of moderate intensity exercise weekly for heart health, and weights twice weekly for bone health  Aim for 7-9 hours of sleep daily      When it comes to diets, agreement about the perfect plan isn't easy to find, even among the experts. Experts at the [employer] developed an idea known as the Healthy Eating Plate. Just imagine a plate divided into logical, healthy portions.  The emphasis is on diet quality:  Load up on vegetables and fruits - one-half of your plate: Aim for color and variety, and remember that potatoes don't count.  Go for whole grains - one-quarter of your plate: Whole wheat, barley, wheat berries, quinoa, oats, brown rice, and foods made with them. If you want pasta, go with whole wheat pasta.  Protein power - one-quarter of your plate: Fish, chicken, beans, and nuts are all healthy, versatile protein sources. Limit red meat.  The diet, however, does go beyond the plate, offering a few other suggestions.  Use healthy plant oils, such as olive, canola, soy, corn, sunflower and peanut. Check the labels, and avoid partially hydrogenated oil, which have unhealthy trans fats.  If you're thirsty, drink water. Coffee and tea are good in moderation, but skip sugary drinks and limit milk and dairy products to one or two daily servings.  The type of carbohydrate in the diet is more important than the amount. Some sources of carbohydrates, such as vegetables, fruits, whole grains, and beans-are healthier than others.  Finally, stay  active.

## 2017-12-02 LAB — COMPLETE METABOLIC PANEL WITH GFR
AG RATIO: 1.4 (calc) (ref 1.0–2.5)
ALBUMIN MSPROF: 4.2 g/dL (ref 3.6–5.1)
ALT: 15 U/L (ref 6–29)
AST: 17 U/L (ref 10–30)
Alkaline phosphatase (APISO): 77 U/L (ref 33–115)
BILIRUBIN TOTAL: 0.4 mg/dL (ref 0.2–1.2)
BUN: 16 mg/dL (ref 7–25)
CHLORIDE: 105 mmol/L (ref 98–110)
CO2: 30 mmol/L (ref 20–32)
Calcium: 9.5 mg/dL (ref 8.6–10.2)
Creat: 0.99 mg/dL (ref 0.50–1.10)
GFR, EST AFRICAN AMERICAN: 80 mL/min/{1.73_m2} (ref >=60)
GFR, Est Non African American: 69 mL/min/{1.73_m2} (ref >=60)
GLUCOSE: 99 mg/dL (ref 65–99)
Globulin: 2.9 g/dL (calc) (ref 1.9–3.7)
Potassium: 4.7 mmol/L (ref 3.5–5.3)
Sodium: 142 mmol/L (ref 135–146)
TOTAL PROTEIN: 7.1 g/dL (ref 6.1–8.1)

## 2017-12-02 LAB — CBC WITH DIFFERENTIAL/PLATELET
BASOS ABS: 61 {cells}/uL (ref 0–200)
Basophils Relative: 1.1 %
EOS ABS: 259 {cells}/uL (ref 15–500)
Eosinophils Relative: 4.7 %
HEMATOCRIT: 41.7 % (ref 35.0–45.0)
Hemoglobin: 13.6 g/dL (ref 11.7–15.5)
LYMPHS ABS: 1452 {cells}/uL (ref 850–3900)
MCH: 29.3 pg (ref 27.0–33.0)
MCHC: 32.6 g/dL (ref 32.0–36.0)
MCV: 89.9 fL (ref 80.0–100.0)
MPV: 11.7 fL (ref 7.5–12.5)
Monocytes Relative: 9.1 %
NEUTROS PCT: 58.7 %
Neutro Abs: 3229 cells/uL (ref 1500–7800)
PLATELETS: 268 10*3/uL (ref 140–400)
RBC: 4.64 10*6/uL (ref 3.80–5.10)
RDW: 12 % (ref 11.0–15.0)
TOTAL LYMPHOCYTE: 26.4 %
WBC: 5.5 10*3/uL (ref 3.8–10.8)
WBCMIX: 501 {cells}/uL (ref 200–950)

## 2017-12-02 LAB — VITAMIN B12: VITAMIN B 12: 379 pg/mL (ref 200–1100)

## 2017-12-02 LAB — MICROALBUMIN / CREATININE URINE RATIO
CREATININE, URINE: 88 mg/dL (ref 20–275)
MICROALB/CREAT RATIO: 2 ug/mg{creat} (ref <30)
Microalb, Ur: 0.2 mg/dL

## 2017-12-02 LAB — URINALYSIS W MICROSCOPIC + REFLEX CULTURE
BILIRUBIN URINE: NEGATIVE
Bacteria, UA: NONE SEEN /HPF
GLUCOSE, UA: NEGATIVE
Hgb urine dipstick: NEGATIVE
Hyaline Cast: NONE SEEN /LPF
Ketones, ur: NEGATIVE
Leukocyte Esterase: NEGATIVE
Nitrites, Initial: NEGATIVE
PH: 5.5 (ref 5.0–8.0)
Protein, ur: NEGATIVE
SPECIFIC GRAVITY, URINE: 1.016 (ref 1.001–1.03)
SQUAMOUS EPITHELIAL / LPF: NONE SEEN /HPF (ref <=5)
WBC UA: NONE SEEN /HPF (ref 0–5)

## 2017-12-02 LAB — LIPID PANEL
Cholesterol: 228 mg/dL — ABNORMAL HIGH (ref <200)
HDL: 64 mg/dL (ref >50)
LDL Cholesterol (Calc): 141 mg/dL (calc) — ABNORMAL HIGH
NON-HDL CHOLESTEROL (CALC): 164 mg/dL — AB (ref <130)
TRIGLYCERIDES: 114 mg/dL (ref <150)
Total CHOL/HDL Ratio: 3.6 (calc) (ref <5.0)

## 2017-12-02 LAB — HEMOGLOBIN A1C
EAG (MMOL/L): 5.8 (calc)
Hgb A1c MFr Bld: 5.3 % of total Hgb (ref <5.7)
Mean Plasma Glucose: 105 (calc)

## 2017-12-02 LAB — MAGNESIUM: Magnesium: 2.2 mg/dL (ref 1.5–2.5)

## 2017-12-02 LAB — VITAMIN D 25 HYDROXY (VIT D DEFICIENCY, FRACTURES): Vit D, 25-Hydroxy: 52 ng/mL (ref 30–100)

## 2017-12-02 LAB — TSH: TSH: 2.72 m[IU]/L

## 2017-12-02 LAB — NO CULTURE INDICATED

## 2017-12-12 ENCOUNTER — Telehealth: Payer: 59 | Admitting: Nurse Practitioner

## 2017-12-12 DIAGNOSIS — J029 Acute pharyngitis, unspecified: Secondary | ICD-10-CM | POA: Diagnosis not present

## 2017-12-12 MED ORDER — DOXYCYCLINE HYCLATE 100 MG PO TABS: 100.0000 mg | ORAL_TABLET | Freq: Two times a day (BID) | ORAL | 0 refills | Status: DC

## 2017-12-12 NOTE — Addendum Note (Signed)
Addended by: Chevis Pretty on: 12/12/2017 01:35 PM   Modules accepted: Orders

## 2017-12-12 NOTE — Progress Notes (Signed)
We are sorry that you are not feeling well.  Here is how we plan to help!  Based on what you have shared with me it is likely that you have strep pharyngitis.  Strep pharyngitis is inflammation and infection in the back of the throat.  This is an infection cause by bacteria and is treated with antibiotics.  I have prescribed doxycycline 100 bi x7 days.  For throat pain, we recommend over the counter oral pain relief medications such as acetaminophen or aspirin, or anti-inflammatory medications such as ibuprofen or naproxen sodium. Topical treatments such as oral throat lozenges or sprays may be used as needed. Strep infections are not as easily transmitted as other respiratory infections, however we still recommend that you avoid close contact with loved ones, especially the very young and elderly.  Remember to wash your hands thoroughly throughout the day as this is the number one way to prevent the spread of infection and wipe down door knobs and counters with disinfectant.   Home Care:  Only take medications as instructed by your medical team.  Complete the entire course of an antibiotic.  Do not take these medications with alcohol.  A steam or ultrasonic humidifier can help congestion.  You can place a towel over your head and breathe in the steam from hot water coming from a faucet.  Avoid close contacts especially the very young and the elderly.  Cover your mouth when you cough or sneeze.  Always remember to wash your hands.  Get Help Right Away If:  You develop worsening fever or sinus pain.  You develop a severe head ache or visual changes.  Your symptoms persist after you have completed your treatment plan.  Make sure you  Understand these instructions.  Will watch your condition.  Will get help right away if you are not doing well or get worse.  Your e-visit answers were reviewed by a board certified advanced clinical practitioner to complete your personal care plan.   Depending on the condition, your plan could have included both over the counter or prescription medications.  If there is a problem please reply  once you have received a response from your provider.  Your safety is important to [place].  If you have drug allergies check your prescription carefully.    You can use MyChart to ask questions about today's visit, request a non-urgent call back, or ask for a work or school excuse for 24 hours related to this e-Visit. If it has been greater than 24 hours you will need to follow up with your provider, or enter a new e-Visit to address those concerns.  You will get an e-mail in the next two days asking about your experience.  I hope that your e-visit has been valuable and will speed your recovery. Thank you for using e-visits.

## 2017-12-22 ENCOUNTER — Encounter: Payer: Self-pay | Admitting: Physician Assistant

## 2018-01-05 DIAGNOSIS — H524 Presbyopia: Secondary | ICD-10-CM | POA: Diagnosis not present

## 2018-01-05 DIAGNOSIS — H5203 Hypermetropia, bilateral: Secondary | ICD-10-CM | POA: Diagnosis not present

## 2018-01-10 ENCOUNTER — Telehealth: Payer: 59 | Admitting: Physician Assistant

## 2018-01-10 DIAGNOSIS — J019 Acute sinusitis, unspecified: Secondary | ICD-10-CM | POA: Diagnosis not present

## 2018-01-10 DIAGNOSIS — B9789 Other viral agents as the cause of diseases classified elsewhere: Secondary | ICD-10-CM | POA: Diagnosis not present

## 2018-01-10 MED ORDER — FLUTICASONE PROPIONATE 50 MCG/ACT NA SUSP: 2.0000 | Freq: Every day | NASAL | 0 refills | Status: AC

## 2018-01-10 NOTE — Progress Notes (Signed)

## 2018-01-15 MED FILL — OXYBUTYNIN CL ER 10 MG TAB: 10 | 90 days supply | Qty: 90 | Fill #2

## 2018-01-19 MED FILL — LEVOTHYROXINE 112 MCG TAB: 112 | 90 days supply | Qty: 90 | Fill #3

## 2018-01-19 MED FILL — METOPROLOL SUCCINATE ER 50: 50 | 90 days supply | Qty: 90 | Fill #1

## 2018-01-21 ENCOUNTER — Ambulatory Visit (INDEPENDENT_AMBULATORY_CARE_PROVIDER_SITE_OTHER): Payer: Self-pay | Admitting: Family Medicine

## 2018-01-21 ENCOUNTER — Encounter: Payer: Self-pay | Admitting: Family Medicine

## 2018-01-21 DIAGNOSIS — J029 Acute pharyngitis, unspecified: Secondary | ICD-10-CM

## 2018-01-21 DIAGNOSIS — J0391 Acute recurrent tonsillitis, unspecified: Secondary | ICD-10-CM

## 2018-01-21 LAB — POCT RAPID STREP A (OFFICE): RAPID STREP A SCREEN: NEGATIVE

## 2018-01-21 MED ORDER — AZITHROMYCIN 250 MG PO TABS: ORAL_TABLET | ORAL | 0 refills | Status: DC

## 2018-01-21 NOTE — Patient Instructions (Signed)
Tonsillitis Tonsillitis is an infection of the throat that causes the tonsils to become red, tender, and swollen. Tonsils are collections of lymphoid tissue at the back of the throat. Each tonsil has crevices (crypts). Tonsils help fight nose and throat infections and keep infection from spreading to other parts of the body for the first 18 months of life. What are the causes? Sudden (acute) tonsillitis is usually caused by infection with streptococcal bacteria. Long-lasting (chronic) tonsillitis occurs when the crypts of the tonsils become filled with pieces of food and bacteria, which makes it easy for the tonsils to become repeatedly infected. What are the signs or symptoms? Symptoms of tonsillitis include:  A sore throat, with possible difficulty swallowing.  White patches on the tonsils.  Fever.  Tiredness.  New episodes of snoring during sleep, when you did not snore before.  Small, foul-smelling, yellowish-white pieces of material (tonsilloliths) that you occasionally cough up or spit out. The tonsilloliths can also cause you to have bad breath.  How is this diagnosed? Tonsillitis can be diagnosed through a physical exam. Diagnosis can be confirmed with the results of lab tests, including a throat culture. How is this treated? The goals of tonsillitis treatment include the reduction of the severity and duration of symptoms and prevention of associated conditions. Symptoms of tonsillitis can be improved with the use of steroids to reduce the swelling. Tonsillitis caused by bacteria can be treated with antibiotic medicines. Usually, treatment with antibiotic medicines is started before the cause of the tonsillitis is known. However, if it is determined that the cause is not bacterial, antibiotic medicines will not treat the tonsillitis. If attacks of tonsillitis are severe and frequent, your health care provider may recommend surgery to remove the tonsils (tonsillectomy). Follow these  instructions at home:  Rest as much as possible and get plenty of sleep.  Drink plenty of fluids. While the throat is very sore, eat soft foods or liquids, such as sherbet, soups, or instant breakfast drinks.  Eat frozen ice pops.  Gargle with a warm or cold liquid to help soothe the throat. Mix 1/4 teaspoon of salt and 1/4 teaspoon of baking soda in 8 oz of water. Contact a health care provider if:  Large, tender lumps develop in your neck.  A rash develops.  A green, yellow-brown, or bloody substance is coughed up.  You are unable to swallow liquids or food for 24 hours.  You notice that only one of the tonsils is swollen. Get help right away if:  You develop any new symptoms such as vomiting, severe headache, stiff neck, chest pain, or trouble breathing or swallowing.  You have severe throat pain along with drooling or voice changes.  You have severe pain, unrelieved with recommended medications.  You are unable to fully open the mouth.  You develop redness, swelling, or severe pain anywhere in the neck.  You have a fever. This information is not intended to replace advice given to you by your health care provider. Make sure you discuss any questions you have with your health care provider. Document Released: 02/12/2005 Document Revised: 10/11/2015 Document Reviewed: 10/22/2012 Elsevier Interactive Patient Education  2017 [employer]  

## 2018-01-21 NOTE — Progress Notes (Signed)
Shari Payne is a 47 y.o. female who presents today with concerns of sore throat and recurrent pain and infections for the last 60 days related to her throat. She reports that her and her S.O have both ben under treatment in the last month and have been passing different illnesses back and forth. She does report recent travel and some potential exposure in a less than clean hotel. She has attempted OTC options with some relief but feels that the left tonsil is frequently bothersome and sore.  Review of Systems  Constitutional: Negative for chills, fever and malaise/fatigue.  HENT: Positive for sore throat. Negative for congestion, ear discharge, ear pain and sinus pain.   Eyes: Negative.   Respiratory: Positive for cough. Negative for sputum production and shortness of breath.   Cardiovascular: Negative.  Negative for chest pain.  Gastrointestinal: Negative for abdominal pain, diarrhea, nausea and vomiting.  Genitourinary: Negative for dysuria, frequency, hematuria and urgency.  Musculoskeletal: Negative for myalgias.  Skin: Negative.   Neurological: Negative for headaches.  Endo/Heme/Allergies: Negative.   Psychiatric/Behavioral: Negative.     O: Vitals:   01/21/18 1414  BP: 118/90  Pulse: 65  Temp: 98.6 F (37 C)  SpO2: 98%     Physical Exam  Constitutional: She is oriented to person, place, and time. Vital signs are normal. She appears well-developed and well-nourished. She is active.  Non-toxic appearance. She does not have a sickly appearance.  HENT:  Head: Normocephalic.  Right Ear: Hearing, tympanic membrane, external ear and ear canal normal.  Left Ear: Hearing, tympanic membrane, external ear and ear canal normal.  Nose: Rhinorrhea present.  Mouth/Throat: Uvula is midline. Posterior oropharyngeal edema and posterior oropharyngeal erythema present. Tonsils are 2+ on the right. Tonsils are 3+ on the left. No tonsillar exudate.    Neck: Normal range of motion. Neck  supple.  Cardiovascular: Normal rate, regular rhythm, normal heart sounds and normal pulses.  Pulmonary/Chest: Effort normal and breath sounds normal.  Abdominal: Soft. Bowel sounds are normal.  Musculoskeletal: Normal range of motion.  Lymphadenopathy:       Head (right side): No submental, no submandibular and no tonsillar adenopathy present.       Head (left side): Tonsillar adenopathy present. No submental and no submandibular adenopathy present.    She has cervical adenopathy.  Neurological: She is alert and oriented to person, place, and time.  Psychiatric: She has a normal mood and affect.  Vitals reviewed.  A: 1. Sore throat   2. Acute recurrent tonsillitis     P: Discussed exam findings, diagnosis etiology and medication use and indications reviewed with patient. Follow- Up and discharge instructions provided. No emergent/urgent issues found on exam.  Patient verbalized understanding of information provided and agrees with plan of care (POC), all questions answered.  1. Sore throat - POCT rapid strep A Results for orders placed or performed in visit on 01/21/18 (from the past 24 hour(s))  POCT rapid strep A     Status: None   Collection Time: 01/21/18  2:28 PM  Result Value Ref Range   Rapid Strep A Screen Negative Negative    2. Acute recurrent tonsillitis - azithromycin (ZITHROMAX) 250 MG tablet; Take 2 tablets on day 1 and then 1 tablet for the next 4 days until medication complete

## 2018-02-22 ENCOUNTER — Other Ambulatory Visit: Payer: Self-pay | Admitting: Physician Assistant

## 2018-02-22 MED FILL — ESCITALOPRAM 20 MG TABLET: 20 | 90 days supply | Qty: 90 | Fill #1

## 2018-02-22 MED FILL — ALPRAZolam 0.5 MG TABS: 0.5 | 90 days supply | Qty: 90 | Fill #0

## 2018-04-20 MED FILL — LEVOTHYROXINE 112 MCG TAB: 112 | 90 days supply | Qty: 90 | Fill #4

## 2018-04-25 ENCOUNTER — Other Ambulatory Visit: Payer: Self-pay | Admitting: Internal Medicine

## 2018-04-25 MED ORDER — OXYBUTYNIN CHLORIDE ER 10 MG PO TB24: ORAL_TABLET | ORAL | 1 refills | Status: DC

## 2018-04-26 MED FILL — OXYBUTYNIN CL ER 10 MG TAB: 10 | 90 days supply | Qty: 90 | Fill #0

## 2018-06-01 ENCOUNTER — Encounter: Payer: Self-pay | Admitting: Adult Health

## 2018-06-01 ENCOUNTER — Ambulatory Visit: Payer: 59 | Admitting: Adult Health

## 2018-06-01 VITALS — BP 104/78 | HR 70 | Ht 68.0 in | Wt 215.6 lb

## 2018-06-01 DIAGNOSIS — R002 Palpitations: Secondary | ICD-10-CM | POA: Diagnosis not present

## 2018-06-01 MED ORDER — METOPROLOL SUCCINATE ER 50 MG PO TB24: 50.0000 mg | ORAL_TABLET | Freq: Every day | ORAL | 3 refills | Status: DC

## 2018-06-01 MED FILL — METOPROLOL SUCCINATE ER 50: 50 | 90 days supply | Qty: 90 | Fill #0

## 2018-06-01 NOTE — Patient Instructions (Signed)
Follow-Up: You will need a follow up appointment in 12 months.  Please call our office 2 months in advance to schedule this appointment.  You may see Kirk Ruths, MD or one of the following Advanced Practice Providers on your designated Care Team:   Kerin Ransom, [place] Roby Lofts, PA-C Sande Rives, [place]   Medication Instructions:  NO CHANGES- Your physician recommends that you continue on your current medications as directed. Please refer to the Current Medication list given to you today. If you need a refill on your cardiac medications before your next appointment, please call your pharmacy.  Labwork: When you have labs (blood work) and your tests are completely normal, you will receive your results ONLY by [place] (if you have MyChart) -OR- A paper copy in the mail.  At [facility], you and your health needs are our priority.  As part of our continuing mission to provide you with exceptional heart care, we have created designated Provider Care Teams.  These Care Teams include your primary Cardiologist (physician) and Advanced Practice Providers (APPs -  Physician Assistants and Nurse Practitioners) who all work together to provide you with the care you need, when you need it.  Thank you for choosing [facility] at [facility]!!

## 2018-06-01 NOTE — Progress Notes (Signed)
Cardiology Office Note   Date:  06/01/2018   ID:  [name], DOB [birth year], MRN [id number]  PCP:  Unk Pinto, MD  Cardiologist: Dr. Stanford Breed  No chief complaint on file.    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  She was last seen by Dr. Stanford Breed on 11/02/2017. She was increased on Toprol XL dose from 25 mg daily to 50 mg daily, and an echocardiogram was ordered. She was advised to stop caffeine.   Echocardiogram dated 11/10/2017 demonstrated normal LV fx EF of 55%-60% no valvular abnormalities or diastolic dysfunction.   She comes today feeling very well. She has decreased her metoprolol XL back down to 25 mg daily from 50 mg as she was feeling sluggish and tired. Once she decreased the dose she felt much better. She still has occasional palpitations, but they are not severe, or long lasting.   She is due to get married this year, plans to run a half marathon, and is training for this. She offers no further cardiac complaints.   Past Medical History:  Diagnosis Date  . Anxiety   . Breast hypertrophy 03/2014  . GERD (gastroesophageal reflux disease)   . Hyperlipidemia   . Hypothyroidism   . Interstitial cystitis     Past Surgical History:  Procedure Laterality Date  . BREAST BIOPSY Left   . BREAST REDUCTION SURGERY Bilateral 04/18/2014   Procedure: MAMMARY REDUCTION  (BREAST) BILATERAL;  Surgeon: Charlene Brooke, MD;  Location: [facility];  Service: Plastics;  Laterality: Bilateral;     Current Outpatient Medications  Medication Sig Dispense Refill  . ALPRAZolam (XANAX) 0.5 MG tablet TAKE 1 TABLET BY MOUTH 3 TIMES DAILY AS NEEDED FOR ANXIETY. TRY TO CUT BACK ON HOW MUCH TAKING, NEEDS TO LAST CLOSE TO 3 MONTHS 90 tablet 0  .  Cholecalciferol (VITAMIN D PO) Take 4,000 Units by mouth.    . cyclobenzaprine (FLEXERIL) 10 MG tablet Take 1 tablet (10 mg total) by mouth every 8 (eight) hours as needed for muscle spasms. 60 tablet 1  . escitalopram (LEXAPRO) 20 MG tablet TAKE 1 TABLET BY MOUTH ONCE DAILY 90 tablet 1  . fluticasone (FLONASE) 50 MCG/ACT nasal spray Place 2 sprays into both nostrils daily. 16 g 0  . levothyroxine (SYNTHROID, LEVOTHROID) 112 MCG tablet TAKE 1 TABLET BY MOUTH ONCE DAILY BEFORE BREAKFAST. 90 tablet 4  . metoprolol succinate (TOPROL-XL) 50 MG 24 hr tablet Take 1 tablet (50 mg total) by mouth daily. Take with or immediately following a meal. 90 tablet 3  . omeprazole (PRILOSEC) 20 MG capsule Take 1 capsule (20 mg total) by mouth 2 (two) times daily. (Patient taking differently: Take 20 mg by mouth as needed. ) 180 capsule 1  . oxybutynin (DITROPAN XL) 10 MG 24 hr tablet Take 1 tablet daily for Bladder 90 tablet 1  . varenicline (CHANTIX STARTING MONTH PAK) 0.5 MG X 11 & 1 MG X 42 tablet Take one 0.5 mg tablet by mouth once daily for 3 days, then increase to one 0.5 mg tablet twice daily for 4 days, then increase to one 1 mg tablet twice daily. 53 tablet 0   No current facility-administered medications for this visit.     Allergies:   Amoxicillin; Bisoprolol; Metoprolol tartrate; and  Verapamil    Social History:  The patient  reports that she quit smoking about 6 months ago. She has never used smokeless tobacco. She reports current alcohol use. She reports that she does not use drugs.   Family History:  The patient's family history includes CVA (age of onset: 23) in her maternal grandfather; Heart failure in her paternal grandfather; Osteosarcoma (age of onset: 61) in her sister.    ROS: All other systems are reviewed and negative. Unless otherwise mentioned in H&P    PHYSICAL EXAM: VS:  BP 104/78   Pulse 70   Ht 5\' 8"  (1.727 m)   Wt 215 lb 9.6 oz (97.8 kg)   SpO2 96%   BMI 32.78 kg/m  ,  BMI Body mass index is 32.78 kg/m. GEN: Well nourished, well developed, in no acute distress HEENT: normal Neck: no JVD, carotid bruits, or masses Cardiac: RRR; no murmurs, rubs, or gallops,no edema  Respiratory:  Clear to auscultation bilaterally, normal work of breathing GI: soft, nontender, nondistended, + BS MS: no deformity or atrophy Skin: warm and dry, no rash Neuro:  Strength and sensation are intact Psych: euthymic mood, full affect   EKG:  Not completed this office visit.   Recent Labs: 12/01/2017: ALT 15; BUN 16; Creat 0.99; Hemoglobin 13.6; Magnesium 2.2; Platelets 268; Potassium 4.7; Sodium 142; TSH 2.72    Lipid Panel    Component Value Date/Time   CHOL 228 (H) 12/01/2017 0931   TRIG 114 12/01/2017 0931   HDL 64 12/01/2017 0931   CHOLHDL 3.6 12/01/2017 0931   VLDL 29 10/03/2016 1006   LDLCALC 141 (H) 12/01/2017 0931      Wt Readings from Last 3 Encounters:  06/01/18 215 lb 9.6 oz (97.8 kg)  01/21/18 214 lb 12.8 oz (97.4 kg)  12/01/17 216 lb (98 kg)      Other studies Reviewed: Echocardiogram 11/23/2017 Left ventricle: The cavity size was normal. Wall thickness was at   the upper limits of normal. Systolic function was normal. The   estimated ejection fraction was in the range of 55% to 60%. Wall   motion was normal; there were no regional wall motion   abnormalities. Left ventricular diastolic function parameters   were normal. - Right ventricle: The cavity size was normal. Wall thickness was   normal. Systolic function was normal. - No significant valvular abnormalities.  ASSESSMENT AND PLAN:  1.  Palpitations: She is now taking metoprolol 25 mg daily and feels better at lower dose. She occasionally has some palpitations which she states are usually because she had too much caffeine. She is not bothered by this. She offers no other complaints.   She is having labs drawn by PCP in a couple of weeks. No new testing or medications are provided. Rx  refill on metoprolol.   Current medicines are reviewed at length with the patient today.    Labs/ tests ordered today include: None   Phill Myron. West Pugh, ANP, AACC   06/01/2018 12:12 PM    [place] [address] Office [phone] Fax [phone]

## 2018-06-03 ENCOUNTER — Ambulatory Visit: Payer: Self-pay | Admitting: Adult Health

## 2018-06-03 DIAGNOSIS — G8929 Other chronic pain: Secondary | ICD-10-CM | POA: Diagnosis not present

## 2018-06-03 DIAGNOSIS — M545 Low back pain: Secondary | ICD-10-CM | POA: Diagnosis not present

## 2018-06-03 DIAGNOSIS — M546 Pain in thoracic spine: Secondary | ICD-10-CM | POA: Diagnosis not present

## 2018-06-03 DIAGNOSIS — R030 Elevated blood-pressure reading, without diagnosis of hypertension: Secondary | ICD-10-CM | POA: Diagnosis not present

## 2018-06-03 DIAGNOSIS — M5136 Other intervertebral disc degeneration, lumbar region: Secondary | ICD-10-CM | POA: Diagnosis not present

## 2018-06-03 DIAGNOSIS — Z6831 Body mass index (BMI) 31.0-31.9, adult: Secondary | ICD-10-CM | POA: Diagnosis not present

## 2018-06-03 DIAGNOSIS — M47816 Spondylosis without myelopathy or radiculopathy, lumbar region: Secondary | ICD-10-CM | POA: Diagnosis not present

## 2018-06-07 ENCOUNTER — Other Ambulatory Visit: Payer: Self-pay | Admitting: Internal Medicine

## 2018-06-07 MED FILL — ESCITALOPRAM 20 MG TABLET: 20 | 90 days supply | Qty: 90 | Fill #0

## 2018-06-07 NOTE — Progress Notes (Signed)
FOLLOW UP  Assessment and Plan:   Tachycardia Continue to cut down on caffeine, ETOH, smoking Continue toprol; symptoms have improved/resolved  Gastroesophageal reflux disease with esophagitis Continue PPI/H2 blocker, diet discussed  Thyroid disease Hypothyroidism-check TSH level, continue medications the same, reminded to take on an empty stomach 30-32mins before food.  -     TSH  Interstitial cystitis Avoid triggers, doing well with oxybutynin  Vitamin D deficiency Near goal at recent check; continue to recommend supplementation for goal of 70-100 Defer vitamin D level  Generalized anxiety disorder/insomnia continue medications, stress management techniques discussed, increase water, good sleep hygiene discussed, increase exercise, and increase veggies.  Medication management -     CMP/GFR -     Magnesium  Hyperlipidemia Working on lifestyle modification, if LDL remains 130+ will initiate rosuvastatin Discussed with patient and she is in agreement Continue low cholesterol diet and exercise.  Check lipid panel.  -     Lipid panel  Obesity Long discussion about weight loss, diet, and exercise Recommended diet heavy in fruits and veggies and low in animal meats, cheeses, and dairy products, appropriate calorie intake Patient will work on portion control, exercise, training for 1/2 marathon Discussed appropriate weight for height and initial goal (<200lb) Follow up at next visit  Continue diet and meds as discussed. Further disposition pending results of labs. Discussed med's effects and SE's.   Over 30 minutes of exam, counseling, chart review, and critical decision making was performed.   Future Appointments  Date Time Provider [place]  12/02/2018  9:00 AM Liane Comber, NP [facility] None    ----------------------------------------------------------------------------------------------------------------------  HPI 48 y.o. female  presents for 3 month  follow up on anxiety/insomnia, tachycardia, cholesterol, glucose management, weight and vitamin D deficiency.   She is engaged to get married Feb 26, 2019.   She saw Dr. McDiarmid for ICS, is now on oxybutynin and reports nearly fully resolved symptoms on med.   She has had ongoing tachycardia/palpitations and was having dyspnea associated with this and was worked up by cardiology; Holter showed some PVCs/PACs, ECHO from 10/2017 was unremarkable. She is cutting back on caffeine/ETOH and quit smoking by chantix. She is on toprol with improvement in symptoms.   She has been prescribed xanax PRN for anxiety; she currently uses 0.25 mg at night if needed to sleep. She uses lexapro 20 mg daily.   BMI is Body mass index is 32.54 kg/m., she has been working on diet and exercise, wants to run 1/2 marathon Wt Readings from Last 3 Encounters:  06/08/18 214 lb (97.1 kg)  06/01/18 215 lb 9.6 oz (97.8 kg)  01/21/18 214 lb 12.8 oz (97.4 kg)   Her blood pressure has been controlled at home, today their BP is BP: 106/76  She does workout. She denies chest pain, shortness of breath, dizziness.   She is not on cholesterol medication. Her cholesterol is not at goal. The cholesterol last visit was:   Lab Results  Component Value Date   CHOL 228 (H) 12/01/2017   HDL 64 12/01/2017   LDLCALC 141 (H) 12/01/2017   TRIG 114 12/01/2017   CHOLHDL 3.6 12/01/2017   Last A1C in the office was:  Lab Results  Component Value Date   HGBA1C 5.3 12/01/2017   She is on thyroid medication. Her medication was not changed last visit.   Lab Results  Component Value Date   TSH 2.72 12/01/2017   Patient is on Vitamin D supplement.   Lab Results  Component Value Date   VD25OH 52 12/01/2017       Current Medications:  Current Outpatient Medications on File Prior to Visit  Medication Sig  . ALPRAZolam (XANAX) 0.5 MG tablet TAKE 1 TABLET BY MOUTH 3 TIMES DAILY AS NEEDED FOR ANXIETY. TRY TO CUT BACK ON HOW MUCH  TAKING, NEEDS TO LAST CLOSE TO 3 MONTHS  . Cholecalciferol (VITAMIN D PO) Take 4,000 Units by mouth.  . cyclobenzaprine (FLEXERIL) 10 MG tablet Take 1 tablet (10 mg total) by mouth every 8 (eight) hours as needed for muscle spasms.  [place] Kitchen escitalopram (LEXAPRO) 20 MG tablet TAKE 1 TABLET BY MOUTH ONCE DAILY  . fluticasone (FLONASE) 50 MCG/ACT nasal spray Place 2 sprays into both nostrils daily.  [place] Kitchen levothyroxine (SYNTHROID, LEVOTHROID) 112 MCG tablet TAKE 1 TABLET BY MOUTH ONCE DAILY BEFORE BREAKFAST.  . metoprolol succinate (TOPROL-XL) 50 MG 24 hr tablet Take 1 tablet (50 mg total) by mouth daily. Take with or immediately following a meal. (Patient taking differently: Take 25 mg by mouth daily. Take with or immediately following a meal.)  . omeprazole (PRILOSEC) 20 MG capsule Take 1 capsule (20 mg total) by mouth 2 (two) times daily. (Patient taking differently: Take 20 mg by mouth as needed. )  . oxybutynin (DITROPAN XL) 10 MG 24 hr tablet Take 1 tablet daily for Bladder   No current facility-administered medications on file prior to visit.      Allergies:  Allergies  Allergen Reactions  . Amoxicillin Rash and Other (See Comments)    FEVER  . Bisoprolol Anxiety  . Metoprolol Tartrate Palpitations  . Verapamil Rash     Medical History:  Past Medical History:  Diagnosis Date  . Anxiety   . Breast hypertrophy 03/2014  . GERD (gastroesophageal reflux disease)   . Hyperlipidemia   . Hypothyroidism   . Interstitial cystitis    Family history- Reviewed and unchanged Social history- Reviewed and unchanged   Review of Systems:  Review of Systems  Constitutional: Negative for malaise/fatigue and weight loss.  HENT: Negative for hearing loss and tinnitus.   Eyes: Negative for blurred vision and double vision.  Respiratory: Negative for cough, shortness of breath and wheezing.   Cardiovascular: Negative for chest pain, palpitations, orthopnea, claudication and leg swelling.   Gastrointestinal: Negative for abdominal pain, blood in stool, constipation, diarrhea, heartburn, melena, nausea and vomiting.  Genitourinary: Negative.   Musculoskeletal: Negative for joint pain and myalgias.  Skin: Negative for rash.  Neurological: Negative for dizziness, tingling, sensory change, weakness and headaches.  Endo/Heme/Allergies: Negative for polydipsia.  Psychiatric/Behavioral: The patient is nervous/anxious and has insomnia.   All other systems reviewed and are negative.     Physical Exam: BP 106/76   Pulse 67   Temp (!) 97.5 F (36.4 C)   Ht 5\' 8"  (1.727 m)   Wt 214 lb (97.1 kg)   LMP 06/04/2018 (Exact Date)   SpO2 99%   BMI 32.54 kg/m  Wt Readings from Last 3 Encounters:  06/08/18 214 lb (97.1 kg)  06/01/18 215 lb 9.6 oz (97.8 kg)  01/21/18 214 lb 12.8 oz (97.4 kg)   General Appearance: Well nourished, in no apparent distress. Eyes: PERRLA, EOMs, conjunctiva no swelling or erythema Sinuses: No Frontal/maxillary tenderness ENT/Mouth: Ext aud canals clear, TMs without erythema, bulging. No erythema, swelling, or exudate on post pharynx.  Tonsils not swollen or erythematous. Hearing normal.  Neck: Supple, thyroid normal.  Respiratory: Respiratory effort normal, BS equal bilaterally without rales, rhonchi,  wheezing or stridor.  Cardio: RRR with no MRGs. Brisk peripheral pulses without edema.  Abdomen: Soft, + BS.  Non tender, no guarding, rebound, hernias, masses. Lymphatics: Non tender without lymphadenopathy.  Musculoskeletal: Full ROM, 5/5 strength, Normal gait Skin: Warm, dry without rashes, lesions, ecchymosis.  Neuro: Cranial nerves intact. No cerebellar symptoms.  Psych: Awake and oriented X 3, normal affect, Insight and Judgment appropriate.    [name], NP 11:10 AM [facility]

## 2018-06-08 ENCOUNTER — Ambulatory Visit: Payer: 59 | Admitting: Adult Health

## 2018-06-08 ENCOUNTER — Encounter: Payer: Self-pay | Admitting: Adult Health

## 2018-06-08 VITALS — BP 106/76 | HR 67 | Temp 97.5°F | Ht 68.0 in | Wt 214.0 lb

## 2018-06-08 DIAGNOSIS — E079 Disorder of thyroid, unspecified: Secondary | ICD-10-CM | POA: Diagnosis not present

## 2018-06-08 DIAGNOSIS — K2100 Gastro-esophageal reflux disease with esophagitis, without bleeding: Secondary | ICD-10-CM

## 2018-06-08 DIAGNOSIS — F411 Generalized anxiety disorder: Secondary | ICD-10-CM | POA: Diagnosis not present

## 2018-06-08 DIAGNOSIS — F5105 Insomnia due to other mental disorder: Secondary | ICD-10-CM | POA: Diagnosis not present

## 2018-06-08 DIAGNOSIS — E559 Vitamin D deficiency, unspecified: Secondary | ICD-10-CM

## 2018-06-08 DIAGNOSIS — R000 Tachycardia, unspecified: Secondary | ICD-10-CM | POA: Diagnosis not present

## 2018-06-08 DIAGNOSIS — E669 Obesity, unspecified: Secondary | ICD-10-CM

## 2018-06-08 DIAGNOSIS — E782 Mixed hyperlipidemia: Secondary | ICD-10-CM

## 2018-06-08 DIAGNOSIS — F99 Mental disorder, not otherwise specified: Secondary | ICD-10-CM

## 2018-06-08 DIAGNOSIS — Z79899 Other long term (current) drug therapy: Secondary | ICD-10-CM | POA: Diagnosis not present

## 2018-06-08 LAB — CBC WITH DIFFERENTIAL/PLATELET
Absolute Monocytes: 374 cells/uL (ref 200–950)
Basophils Absolute: 59 cells/uL (ref 0–200)
Basophils Relative: 1.3 %
Eosinophils Absolute: 234 cells/uL (ref 15–500)
Eosinophils Relative: 5.2 %
HCT: 41.4 % (ref 35.0–45.0)
Hemoglobin: 14.2 g/dL (ref 11.7–15.5)
Lymphs Abs: 1634 cells/uL (ref 850–3900)
MCH: 30.5 pg (ref 27.0–33.0)
MCHC: 34.3 g/dL (ref 32.0–36.0)
MCV: 88.8 fL (ref 80.0–100.0)
MPV: 11.5 fL (ref 7.5–12.5)
Monocytes Relative: 8.3 %
Neutro Abs: 2201 cells/uL (ref 1500–7800)
Neutrophils Relative %: 48.9 %
Platelets: 327 10*3/uL (ref 140–400)
RBC: 4.66 10*6/uL (ref 3.80–5.10)
RDW: 12.2 % (ref 11.0–15.0)
TOTAL LYMPHOCYTE: 36.3 %
WBC: 4.5 10*3/uL (ref 3.8–10.8)

## 2018-06-08 LAB — COMPLETE METABOLIC PANEL WITH GFR
AG Ratio: 1.3 (calc) (ref 1.0–2.5)
ALKALINE PHOSPHATASE (APISO): 63 U/L (ref 33–115)
ALT: 19 U/L (ref 6–29)
AST: 19 U/L (ref 10–35)
Albumin: 4.1 g/dL (ref 3.6–5.1)
BUN: 11 mg/dL (ref 7–25)
CO2: 27 mmol/L (ref 20–32)
CREATININE: 1.05 mg/dL (ref 0.50–1.10)
Calcium: 9.5 mg/dL (ref 8.6–10.2)
Chloride: 105 mmol/L (ref 98–110)
GFR, Est African American: 74 mL/min/{1.73_m2} (ref >=60)
GFR, Est Non African American: 64 mL/min/{1.73_m2} (ref >=60)
GLUCOSE: 88 mg/dL (ref 65–99)
Globulin: 3.1 g/dL (calc) (ref 1.9–3.7)
Potassium: 4.6 mmol/L (ref 3.5–5.3)
SODIUM: 141 mmol/L (ref 135–146)
Total Bilirubin: 0.4 mg/dL (ref 0.2–1.2)
Total Protein: 7.2 g/dL (ref 6.1–8.1)

## 2018-06-08 LAB — LIPID PANEL
CHOLESTEROL: 224 mg/dL — AB (ref <200)
HDL: 66 mg/dL (ref >50)
LDL Cholesterol (Calc): 139 mg/dL (calc) — ABNORMAL HIGH
Non-HDL Cholesterol (Calc): 158 mg/dL (calc) — ABNORMAL HIGH (ref <130)
Total CHOL/HDL Ratio: 3.4 (calc) (ref <5.0)
Triglycerides: 86 mg/dL (ref <150)

## 2018-06-08 LAB — TSH: TSH: 7.32 mIU/L — ABNORMAL HIGH

## 2018-06-08 NOTE — Patient Instructions (Signed)
Goals    . LDL CALC < 100    . Weight (lb) < 200 lb (90.7 kg)      Know what a healthy weight is for you (roughly BMI <25) and aim to maintain this  Aim for 7+ servings of fruits and vegetables daily  65-80+ fluid ounces of water or unsweet tea for healthy kidneys  Limit to max 1 drink of alcohol per day; avoid smoking/tobacco  Limit animal fats in diet for cholesterol and heart health - choose grass fed whenever available  Avoid highly processed foods, and foods high in saturated/trans fats  Aim for low stress - take time to unwind and care for your mental health  Aim for 150 min of moderate intensity exercise weekly for heart health, and weights twice weekly for bone health  Aim for 7-9 hours of sleep daily     Preventing High Cholesterol Cholesterol is a waxy, fat-like substance that your body needs in small amounts. Your liver makes all the cholesterol that your body needs. Having high cholesterol (hypercholesterolemia) increases your risk for heart disease and stroke. Extra (excess) cholesterol comes from the food you eat, such as animal-based fat (saturated fat) from meat and some dairy products. High cholesterol can often be prevented with diet and lifestyle changes. If you already have high cholesterol, you can control it with diet and lifestyle changes, as well as medicine. What nutrition changes can be made?  Eat less saturated fat. Foods that contain saturated fat include red meat and some dairy products.  Avoid processed meats, like bacon and lunch meats.  Avoid trans fats, which are found in margarine and some baked goods.  Avoid foods and beverages that have added sugars.  Eat more fruits, vegetables, and whole grains.  Choose healthy sources of protein, such as fish, poultry, and nuts.  Choose healthy sources of fat, such as: ? Nuts. ? Vegetable oils, especially olive oil. ? Fish that have healthy fats (omega-3 fatty acids), such as mackerel or  salmon. What lifestyle changes can be made?   Lose weight if you are overweight. Losing 5-10 lb (2.3-4.5 kg) can help prevent or control high cholesterol and reduce your risk for diabetes and high blood pressure. Ask your health care provider to help you with a diet and exercise plan to safely lose weight.  Get enough exercise. Do at least 150 minutes of moderate-intensity exercise each week. ? You could do this in short exercise sessions several times a day, or you could do longer exercise sessions a few times a week. For example, you could take a brisk 10-minute walk or bike ride, 3 times a day, for 5 days a week.  Do not smoke. If you need help quitting, ask your health care provider.  Limit your alcohol intake. If you drink alcohol, limit alcohol intake to no more than 1 drink a day for nonpregnant women and 2 drinks a day for men. One drink equals 12 oz of beer, 5 oz of wine, or 1 oz of hard liquor. Why are these changes important?  If you have high cholesterol, deposits (plaques) may build up on the walls of your blood vessels. Plaques make the arteries narrower and stiffer, which can restrict or block blood flow and cause blood clots to form. This greatly increases your risk for heart attack and stroke. Making diet and lifestyle changes can reduce your risk for these life-threatening conditions. What can I do to lower my risk?  Manage your risk factors for  high cholesterol. Talk with your health care provider about all of your risk factors and how to lower your risk.  Manage other conditions that you have, such as diabetes or high blood pressure (hypertension).  Have your cholesterol checked at regular intervals.  Keep all follow-up visits as told by your health care provider. This is important. How is this treated? In addition to diet and lifestyle changes, your health care provider may recommend medicines to help lower cholesterol, such as a medicine to reduce the amount of  cholesterol made in your liver. You may need medicine if:  Diet and lifestyle changes do not lower your cholesterol enough.  You have high cholesterol and other risk factors for heart disease or stroke. Take over-the-counter and prescription medicines only as told by your health care provider. Where to find more information  American Heart Association: ThisTune.com.pt.jsp  National Heart, Lung, and Blood Institute: FrenchToiletries.com.cy Summary  High cholesterol increases your risk for heart disease and stroke. By keeping your cholesterol level low, you can reduce your risk for these conditions.  Diet and lifestyle changes are the most important steps in preventing high cholesterol.  Work with your health care provider to manage your risk factors, and have your blood tested regularly. This information is not intended to replace advice given to you by your health care provider. Make sure you discuss any questions you have with your health care provider. Document Released: 05/20/2015 Document Revised: 01/12/2016 Document Reviewed: 01/12/2016 Elsevier Interactive Patient Education  2019 Elsevier Inc.    Rosuvastatin Tablets What is this medicine? ROSUVASTATIN (roe SOO va sta tin) is known as a HMG-CoA reductase inhibitor or 'statin'. It lowers cholesterol and triglycerides in the blood. This drug may also reduce the risk of heart attack, stroke, or other health problems in patients with risk factors for heart disease. Diet and lifestyle changes are often used with this drug. This medicine may be used for other purposes; ask your health care provider or pharmacist if you have questions. COMMON BRAND NAME(S): Crestor What should I tell my health care provider before I take this medicine? They need to know if you have any of these conditions: -diabetes -if you often drink  alcohol -history of stroke -kidney disease -liver disease -muscle aches or weakness -thyroid disease -an unusual or allergic reaction to rosuvastatin, other medicines, foods, dyes, or preservatives -pregnant or trying to get pregnant -breast-feeding How should I use this medicine? Take this medicine by mouth with a glass of water. Follow the directions on the prescription label. Do not cut, crush or chew this medicine. You can take this medicine with or without food. Take your doses at regular intervals. Do not take your medicine more often than directed. Talk to your pediatrician regarding the use of this medicine in children. While this drug may be prescribed for children as young as 33 years old for selected conditions, precautions do apply. Overdosage: If you think you have taken too much of this medicine contact a poison control center or emergency room at once. NOTE: This medicine is only for you. Do not share this medicine with others. What if I miss a dose? If you miss a dose, take it as soon as you can. If your next dose is to be taken in less than 12 hours, then do not take the missed dose. Take the next dose at your regular time. Do not take double or extra doses. What may interact with this medicine? Do not take this medicine with  any of the following medications: -herbal medicines like red yeast rice This medicine may also interact with the following medications: -alcohol -antacids containing aluminum hydroxide or magnesium hydroxide -cyclosporine -other medicines for high cholesterol -some medicines for HIV infection -warfarin This list may not describe all possible interactions. Give your health care provider a list of all the medicines, herbs, non-prescription drugs, or dietary supplements you use. Also tell them if you smoke, drink alcohol, or use illegal drugs. Some items may interact with your medicine. What should I watch for while using this medicine? Visit your doctor  or health care professional for regular check-ups. You may need regular tests to make sure your liver is working properly. Your health care professional may tell you to stop taking this medicine if you develop muscle problems. If your muscle problems do not go away after stopping this medicine, contact your health care professional. Do not become pregnant while taking this medicine. Women should inform their health care professional if they wish to become pregnant or think they might be pregnant. There is a potential for serious side effects to an unborn child. Talk to your health care professional or pharmacist for more information. Do not breast-feed an infant while taking this medicine. This medicine may affect blood sugar levels. If you have diabetes, check with your doctor or health care professional before you change your diet or the dose of your diabetic medicine. If you are going to need surgery or other procedure, tell your doctor that you are using this medicine. This drug is only part of a total heart-health program. Your doctor or a dietician can suggest a low-cholesterol and low-fat diet to help. Avoid alcohol and smoking, and keep a proper exercise schedule. This medicine may cause a decrease in Co-Enzyme Q-10. You should make sure that you get enough Co-Enzyme Q-10 while you are taking this medicine. Discuss the foods you eat and the vitamins you take with your health care professional. What side effects may I notice from receiving this medicine? Side effects that you should report to your doctor or health care professional as soon as possible: -allergic reactions like skin rash, itching or hives, swelling of the face, lips, or tongue -dark urine -fever -joint pain -muscle cramps, pain -redness, blistering, peeling or loosening of the skin, including inside the mouth -trouble passing urine or change in the amount of urine -unusually weak or tired -yellowing of the eyes or skin Side  effects that usually do not require medical attention (report to your doctor or health care professional if they continue or are bothersome): -constipation -heartburn -nausea -stomach gas, pain, upset This list may not describe all possible side effects. Call your doctor for medical advice about side effects. You may report side effects to FDA at 1-800-FDA-1088. Where should I keep my medicine? Keep out of the reach of children. Store at room temperature between 20 and 25 degrees C (68 and 77 degrees F). Keep container tightly closed (protect from moisture). Throw away any unused medicine after the expiration date. NOTE: This sheet is a summary. It may not cover all possible information. If you have questions about this medicine, talk to your doctor, pharmacist, or health care provider.  2019 Elsevier/Gold Standard (2017-01-06 12:42:43)

## 2018-06-09 ENCOUNTER — Other Ambulatory Visit: Payer: Self-pay | Admitting: Adult Health

## 2018-06-09 MED ORDER — ROSUVASTATIN CALCIUM 5 MG PO TABS: 5.0000 mg | ORAL_TABLET | Freq: Every day | ORAL | 1 refills | Status: DC

## 2018-06-09 MED FILL — ROSUVASTATIN CALCIUM 5 MG T: 5 | 90 days supply | Qty: 90 | Fill #0

## 2018-07-01 ENCOUNTER — Ambulatory Visit: Payer: 59 | Attending: Neurosurgery | Admitting: Physical Therapy

## 2018-07-01 ENCOUNTER — Other Ambulatory Visit: Payer: Self-pay

## 2018-07-01 ENCOUNTER — Encounter: Payer: Self-pay | Admitting: Physical Therapy

## 2018-07-01 DIAGNOSIS — G8929 Other chronic pain: Secondary | ICD-10-CM | POA: Insufficient documentation

## 2018-07-01 DIAGNOSIS — M62830 Muscle spasm of back: Secondary | ICD-10-CM | POA: Diagnosis not present

## 2018-07-01 DIAGNOSIS — M545 Low back pain, unspecified: Secondary | ICD-10-CM

## 2018-07-01 DIAGNOSIS — M6281 Muscle weakness (generalized): Secondary | ICD-10-CM | POA: Insufficient documentation

## 2018-07-04 NOTE — Patient Instructions (Signed)
Access Code: [email or web address]  URL: [email or web address]  Date: 07/04/2018  Prepared by: Lovett Calender   Exercises  Seated Hamstring Stretch - 2 reps - 1 sets - 30 sec hold - 1x daily - 7x weekly  Supine Figure 4 Piriformis Stretch - 2 reps - 1 sets - 30 sec hold - 1x daily - 7x weekly  Seated Piriformis Stretch with Trunk Bend - 2 reps - 1 sets - 30 sec hold - 1x daily - 7x weekly  Gastroc Stretch on Wall - 3 reps - 1 sets - 30 sec hold - 1x daily - 7x weekly  Supine Piriformis Stretch with Foot on Ground - 2 reps - 1 sets - 30 sec hold - 1x daily - 7x weekly  Supine Lower Trunk Rotation - 3 reps - 1 sets - 10 sec hold - 1x daily - 7x weekly

## 2018-07-04 NOTE — Therapy (Signed)
[facility] [address], [place], [place], [address] Phone: [phone]   Fax:  [phone]  Physical Therapy Evaluation  Patient Details  Name: Shari Payne MRN: [id number] Date of Birth: 04-11-1971 Referring Provider (PT): Jovita Gamma, MD   Encounter Date: 07/01/2018  PT End of Session - 07/04/18 2951    Visit Number  1    Date for PT Re-Evaluation  08/26/18    PT Start Time  8841    PT Stop Time  1055    PT Time Calculation (min)  40 min    Activity Tolerance  Patient tolerated treatment well    Behavior During Therapy  [facility] for tasks assessed/performed       Past Medical History:  Diagnosis Date  . Anxiety   . Breast hypertrophy 03/2014  . GERD (gastroesophageal reflux disease)   . Hyperlipidemia   . Hypothyroidism   . Interstitial cystitis     Past Surgical History:  Procedure Laterality Date  . BREAST BIOPSY Left   . BREAST REDUCTION SURGERY Bilateral 04/18/2014   Procedure: MAMMARY REDUCTION  (BREAST) BILATERAL;  Surgeon: Charlene Brooke, MD;  Location: [place];  Service: Plastics;  Laterality: Bilateral;    There were no vitals filed for this visit.   Subjective Assessment - 07/04/18 1745    Subjective  Pt states she has always had back pain.  Pt reports had pain about 2 years ago and had pain down the left LE in the past.    Pertinent History  --    Patient Stated Goals  not be limited to get back up after bending forward    Currently in Pain?  Yes    Pain Score  3     Pain Location  Back    Pain Orientation  Lower    Pain Descriptors / Indicators  Aching    Pain Type  Chronic pain    Pain Onset  More than a month ago    Pain Frequency  Intermittent    Aggravating Factors   bending and getting up from lying down (worse at night)    Pain Relieving Factors  ibuprophen when it is really bad    Multiple Pain Sites  No         [facility] PT Assessment - 07/04/18  0001      Assessment   Medical Diagnosis  M54.5 (ICD-10-CM) - Low back pain    Referring Provider (PT)  Jovita Gamma, MD    Onset Date/Surgical Date  --   2 years   Prior Therapy  No      Precautions   Precautions  None      Restrictions   Weight Bearing Restrictions  No      Balance Screen   Has the patient fallen in the past 6 months  No      [place] residence    Living Arrangements  Spouse/significant other      Prior Function   Level of Independence  Independent    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[employer]     Cognition   Overall Cognitive Status  Within Functional Limits for tasks assessed      Observation/Other Assessments   Focus on Therapeutic Outcomes (FOTO)   35% limited   goal 26%     Posture/Postural Control   Posture/Postural Control  Postural limitations  Postural Limitations  Rounded Shoulders      AROM   Lumbar Flexion  50% limited +pain    Lumbar - Right Side Bend  20% limited +pain      Strength   Overall Strength  Deficits    Overall Strength Comments  instability of TrA and glutes with instability noted on SLR and corrected with support to TrA and gluteals    Right Hip ADduction  4/5    Left Hip Flexion  4/5    Left Hip Extension  4/5    Left Hip ABduction  4/5    Left Hip ADduction  4-/5      Flexibility   Soft Tissue Assessment /Muscle Length  yes    Hamstrings  60% h/s; 75% calf      Palpation   Palpation comment  lumbar fascia tight, hamstring and calves tight      Special Tests    Special Tests  Lumbar    Lumbar Tests  Straight Leg Raise      Straight Leg Raise   Findings  Negative    Comment  see above comment      Ambulation/Gait   Gait Pattern  Within Functional Limits                Objective measurements completed on examination: See above findings.      [place] Adult PT Treatment/Exercise - 07/04/18 0001      Self-Care   Self-Care  Other  Self-Care Comments    Other Self-Care Comments   educated and performed initial HEP             PT Education - 07/04/18 1732    Education Details   Access Code: [email or web address]     Person(s) Educated  Patient    Methods  Explanation;Demonstration;Handout    Comprehension  Verbalized understanding;Returned demonstration       PT Short Term Goals - 07/04/18 1739      PT SHORT TERM GOAL #1   Title  ind with initial HEP    Time  4    Period  Weeks    Status  New    Target Date  07/29/18      PT SHORT TERM GOAL #2   Title  Pt will report at least 25% less pain    Time  4    Period  Weeks    Status  New    Target Date  07/29/18        PT Long Term Goals - 07/04/18 1742      PT LONG TERM GOAL #1   Title  pt will be ind with advanced HEP    Time  8    Period  Weeks    Status  New    Target Date  08/26/18      PT LONG TERM GOAL #2   Title  pt will report 75% less pain     Time  8    Period  Weeks    Status  New    Target Date  08/26/18      PT LONG TERM GOAL #3   Title  pt will demonstrate ability to engage TrA during transitional movements for improved functional activites with decreased pain    Time  8    Period  Weeks    Status  New    Target Date  08/26/18      PT LONG TERM GOAL #4   Title  FOTO <  or = to 26% limited    Time  8    Period  Weeks    Status  New    Target Date  08/26/18             Plan - 07/04/18 1732    Clinical Impression Statement  Pt presents to clinic due to chronic low back pain.  She has posture abnormalities as mentioned above.  She demonstrates decreased ROM and weakness in LE Lt>Rt.  She also demonstrates decreased ability to stabilize pelvis with LE movements in supine position.  Pt will benefit from core strengthening program as well as techniques to release muscle and fascial restrictions in order to improve ROM deficits.  Pt will benefit from skilled PT to improved functional activities for reduced risk of injury.     Clinical Presentation  Stable    Clinical Decision Making  Low    Rehab Potential  Excellent    PT Frequency  1x / week    PT Duration  8 weeks    PT Treatment/Interventions  ADLs/Self Care Home Management;Cryotherapy;Electrical Stimulation;Moist Heat;Traction;Therapeutic activities;Therapeutic exercise;Neuromuscular re-education;Patient/family education;Manual techniques;Dry needling;Taping    PT Next Visit Plan  f/u on initial HEP given at eval, TrA activation and strengthening    PT Home Exercise Plan   Access Code: [email or web address]     Consulted and Agree with Plan of Care  Patient       Patient will benefit from skilled therapeutic intervention in order to improve the following deficits and impairments:  Pain, Increased fascial restricitons, Increased muscle spasms, Postural dysfunction, Decreased strength, Decreased range of motion  Visit Diagnosis: Chronic low back pain, unspecified back pain laterality, unspecified whether sciatica present  Muscle weakness (generalized)  Muscle spasm of back     Problem List Patient Active Problem List   Diagnosis Date Noted  . Tachycardia 05/24/2017  . Insomnia 04/23/2017  . Hyperlipidemia 04/23/2017  . Obesity (BMI 30.0-34.9) 04/23/2017  . Interstitial cystitis 08/15/2014  . Generalized anxiety disorder 08/15/2014  . GERD (gastroesophageal reflux disease) 08/15/2014  . Thyroid disease   . Vitamin D deficiency     Zannie Cove, PT 07/04/2018, 5:48 PM  [facility] [address] [place], [place], [address] Phone: [phone]   Fax:  [phone]  Name: DARCEY CARDY MRN: [id number] Date of Birth: May 26, 1970

## 2018-07-06 ENCOUNTER — Ambulatory Visit: Payer: 59 | Admitting: Physical Therapy

## 2018-07-06 ENCOUNTER — Encounter: Payer: Self-pay | Admitting: Physical Therapy

## 2018-07-06 DIAGNOSIS — M545 Low back pain: Secondary | ICD-10-CM | POA: Diagnosis not present

## 2018-07-06 DIAGNOSIS — M62830 Muscle spasm of back: Secondary | ICD-10-CM

## 2018-07-06 DIAGNOSIS — M6281 Muscle weakness (generalized): Secondary | ICD-10-CM | POA: Diagnosis not present

## 2018-07-06 DIAGNOSIS — G8929 Other chronic pain: Secondary | ICD-10-CM | POA: Diagnosis not present

## 2018-07-06 NOTE — Therapy (Signed)
[facility] [address] [place], [place], [address] Phone: [phone]   Fax:  [phone]  Physical Therapy Treatment  Patient Details  Name: Shari Payne MRN: [id number] Date of Birth: 26-Sep-1970 Referring Provider (PT): Jovita Gamma, MD   Encounter Date: 07/06/2018  PT End of Session - 07/06/18 0806    Visit Number  2    Date for PT Re-Evaluation  08/26/18    Authorization Type  UMR    PT Start Time  0800    PT Stop Time  0840    PT Time Calculation (min)  40 min    Activity Tolerance  Patient tolerated treatment well    Behavior During Therapy  [facility] for tasks assessed/performed       Past Medical History:  Diagnosis Date  . Anxiety   . Breast hypertrophy 03/2014  . GERD (gastroesophageal reflux disease)   . Hyperlipidemia   . Hypothyroidism   . Interstitial cystitis     Past Surgical History:  Procedure Laterality Date  . BREAST BIOPSY Left   . BREAST REDUCTION SURGERY Bilateral 04/18/2014   Procedure: MAMMARY REDUCTION  (BREAST) BILATERAL;  Surgeon: Charlene Brooke, MD;  Location: [place];  Service: Plastics;  Laterality: Bilateral;    There were no vitals filed for this visit.  Subjective Assessment - 07/06/18 0804    Subjective  NO changes since initial evaluation. I have been more thoughtfull with stretches.     Patient Stated Goals  not be limited to get back up after bending forward    Currently in Pain?  Yes    Pain Score  2     Pain Location  Back    Pain Orientation  Lower    Pain Descriptors / Indicators  Aching    Pain Type  Chronic pain    Pain Onset  More than a month ago    Pain Frequency  Intermittent    Aggravating Factors   bending and getting up from lying down, worse at night    Pain Relieving Factors  Ibuprophen when it is really bad    Multiple Pain Sites  No                       [facility] Adult PT Treatment/Exercise - 07/06/18  0001      Lumbar Exercises: Stretches   Active Hamstring Stretch  Right;Left;2 reps;30 seconds   sitting   Piriformis Stretch  Right;Left;2 reps;30 seconds   sitting   Other Lumbar Stretch Exercise  standing lumbar flexion with hands on counter hold 30 sec; clasp hands behind back and stretch upper back      Lumbar Exercises: Aerobic   Elliptical  level 1 for 5 min      Lumbar Exercises: Seated   Other Seated Lumbar Exercises  transverse abdominus hold 5 sec 10x      Lumbar Exercises: Supine   Ab Set  10 reps;5 seconds      Manual Therapy   Manual Therapy  Soft tissue mobilization    Soft tissue mobilization  lumbar paraspinals and gluteus using the Addaday       Trigger Point Dry Needling - 07/06/18 0826    Consent Given?  Yes    Education Handout Provided  Yes    Muscles Treated Upper Body  --   gluteius medius and bil. lumbar multifidi   Muscles Treated Lower Body  --   trigger point  release, elongation of muscle          PT Education - 07/06/18 0842    Education Details  Access Code: [email or web address] ; information on dry needling    Person(s) Educated  Patient    Methods  Explanation;Demonstration;Verbal cues;Handout    Comprehension  Verbalized understanding;Returned demonstration       PT Short Term Goals - 07/04/18 1739      PT SHORT TERM GOAL #1   Title  ind with initial HEP    Time  4    Period  Weeks    Status  New    Target Date  07/29/18      PT SHORT TERM GOAL #2   Title  Pt will report at least 25% less pain    Time  4    Period  Weeks    Status  New    Target Date  07/29/18        PT Long Term Goals - 07/04/18 1742      PT LONG TERM GOAL #1   Title  pt will be ind with advanced HEP    Time  8    Period  Weeks    Status  New    Target Date  08/26/18      PT LONG TERM GOAL #2   Title  pt will report 75% less pain     Time  8    Period  Weeks    Status  New    Target Date  08/26/18      PT LONG TERM GOAL #3   Title  pt will  demonstrate ability to engage TrA during transitional movements for improved functional activites with decreased pain    Time  8    Period  Weeks    Status  New    Target Date  08/26/18      PT LONG TERM GOAL #4   Title  FOTO < or = to 26% limited    Time  8    Period  Weeks    Status  New    Target Date  08/26/18            Plan - 07/06/18 0806    Clinical Impression Statement  Patient had no pain after manual work and felt looser. Patient had trigger points in lumbar and gluteius medius. Patient is able to contract her lower abdominals to stabilize her pelvis. Patient does better with stretches in sitting or standing to do at work. Patient will benefit from skilled PT to improve functional activities for reduced risk of injury.     Rehab Potential  Excellent    PT Frequency  1x / week    PT Duration  8 weeks    PT Treatment/Interventions  ADLs/Self Care Home Management;Cryotherapy;Electrical Stimulation;Moist Heat;Traction;Therapeutic activities;Therapeutic exercise;Neuromuscular re-education;Patient/family education;Manual techniques;Dry needling;Taping    PT Next Visit Plan  assess dry needling, work on core stabilization    PT Home Exercise Plan   Access Code: [email or web address]     Consulted and Agree with Plan of Care  Patient       Patient will benefit from skilled therapeutic intervention in order to improve the following deficits and impairments:  Pain, Increased fascial restricitons, Increased muscle spasms, Postural dysfunction, Decreased strength, Decreased range of motion  Visit Diagnosis: Chronic low back pain, unspecified back pain laterality, unspecified whether sciatica present  Muscle weakness (generalized)  Muscle spasm of back     Problem List Patient Active  Problem List   Diagnosis Date Noted  . Tachycardia 05/24/2017  . Insomnia 04/23/2017  . Hyperlipidemia 04/23/2017  . Obesity (BMI 30.0-34.9) 04/23/2017  . Interstitial cystitis 08/15/2014  .  Generalized anxiety disorder 08/15/2014  . GERD (gastroesophageal reflux disease) 08/15/2014  . Thyroid disease   . Vitamin D deficiency     Earlie Counts, PT 07/06/18 8:47 AM   [facility] [address], [place], [place], [address] Phone: [phone]   Fax:  [phone]  Name: FLOY ANGERT MRN: [id number] Date of Birth: [birth year]

## 2018-07-06 NOTE — Patient Instructions (Signed)
Access Code: [email or web address]  URL: [email or web address]  Date: 07/06/2018  Prepared by: Earlie Counts   Exercises  Seated Hamstring Stretch - 2 reps - 1 sets - 30 sec hold - 1x daily - 7x weekly  Supine Figure 4 Piriformis Stretch - 2 reps - 1 sets - 30 sec hold - 1x daily - 7x weekly  Seated Piriformis Stretch with Trunk Bend - 2 reps - 1 sets - 30 sec hold - 1x daily - 7x weekly  Gastroc Stretch on Wall - 3 reps - 1 sets - 30 sec hold - 1x daily - 7x weekly  Supine Piriformis Stretch with Foot on Ground - 2 reps - 1 sets - 30 sec hold - 1x daily - 7x weekly  Supine Lower Trunk Rotation - 3 reps - 1 sets - 10 sec hold - 1x daily - 7x weekly  Standing Lumbar Spine Flexion Stretch Counter - 2 reps - 1 sets - 30 sec hold - 1x daily - 7x weekly  Chest and Bicep Stretch - Arms Behind Back - 2 reps - 1 sets - 30 sec hold - 1x daily - 7x weekly  Hooklying Transversus Abdominis Palpation - 10 reps - 1 sets - 5 sec hold - 1x daily - 7x weekly  Seated Transversus Abdominis Bracing - 5 reps - 1 sets - 5 sec hold - 2x daily - 7x weekly  Patient Education  Trigger [facility] Dry Needling [facility] [address], [place], [place] [address] Phone # [phone] Fax [phone]

## 2018-07-14 ENCOUNTER — Ambulatory Visit: Payer: 59 | Admitting: Physical Therapy

## 2018-07-14 ENCOUNTER — Encounter: Payer: Self-pay | Admitting: Physical Therapy

## 2018-07-14 DIAGNOSIS — M545 Low back pain: Secondary | ICD-10-CM | POA: Diagnosis not present

## 2018-07-14 DIAGNOSIS — G8929 Other chronic pain: Secondary | ICD-10-CM

## 2018-07-14 DIAGNOSIS — M62830 Muscle spasm of back: Secondary | ICD-10-CM | POA: Diagnosis not present

## 2018-07-14 DIAGNOSIS — M6281 Muscle weakness (generalized): Secondary | ICD-10-CM

## 2018-07-14 NOTE — Therapy (Addendum)
[facility] [address],  [place], [place], [address] Phone: [phone]   Fax:  [phone]  Physical Therapy Treatment  Patient Details  Name: Shari Payne MRN: [id number] Date of Birth: Sep 30, 1970 Referring Provider (PT): Jovita Gamma, MD   Encounter Date: 07/14/2018  PT End of Session - 07/14/18 1022    Visit Number  3    Date for PT Re-Evaluation  08/26/18    Authorization Type  UMR    PT Start Time  1015    PT Stop Time  1055    PT Time Calculation (min)  40 min    Activity Tolerance  Patient tolerated treatment well    Behavior During Therapy  [facility] for tasks assessed/performed       Past Medical History:  Diagnosis Date  . Anxiety   . Breast hypertrophy 03/2014  . GERD (gastroesophageal reflux disease)   . Hyperlipidemia   . Hypothyroidism   . Interstitial cystitis     Past Surgical History:  Procedure Laterality Date  . BREAST BIOPSY Left   . BREAST REDUCTION SURGERY Bilateral 04/18/2014   Procedure: MAMMARY REDUCTION  (BREAST) BILATERAL;  Surgeon: Charlene Brooke, MD;  Location: [place];  Service: Plastics;  Laterality: Bilateral;    There were no vitals filed for this visit.  Subjective Assessment - 07/14/18 1019    Subjective  the dry needling has helped me alot. Not taking the Ibuprofen as much.     Patient Stated Goals  not be limited to get back up after bending forward    Currently in Pain?  Yes    Pain Score  1     Pain Location  Back    Pain Orientation  Lower    Pain Descriptors / Indicators  Aching    Pain Type  Chronic pain    Pain Onset  More than a month ago    Pain Frequency  Intermittent    Aggravating Factors   bending and getting up from lying down, worse at night    Pain Relieving Factors  Ibuprophen                       [facility] Adult PT Treatment/Exercise - 07/14/18 0001      Lumbar Exercises: Stretches   Active Hamstring  Stretch  Right;Left;2 reps;30 seconds   sitting     Lumbar Exercises: Aerobic   Elliptical  level 2 for 5 min      Lumbar Exercises: Supine   Dead Bug  10 reps;1 second   VC on core tightening   Bridge  10 reps;1 second   difficult for patient     Lumbar Exercises: Quadruped   Opposite Arm/Leg Raise  Right arm/Left leg;Left arm/Right leg;10 reps;1 second   tactile cues to keep spinal neutral     Shoulder Exercises: Seated   Flexion  Strengthening;Right;Left;10 reps    Flexion Weight (lbs)  2    Flexion Limitations  core bracing      Manual Therapy   Manual Therapy  Soft tissue mobilization    Soft tissue mobilization  lumbar paraspinals and gluteus using the Addaday       Trigger Point Dry Needling - 07/14/18 1044    Consent Given?  Yes    Muscles Treated Upper Body  Quadratus Lumborum   lumbar multifidi L1-L5   Muscles Treated Lower Body  Gluteus minimus;Gluteus maximus;Piriformis    Gluteus Maximus Response  Twitch response elicited;Palpable increased muscle length    Gluteus Minimus Response  Twitch response elicited;Palpable increased muscle length    Piriformis Response  Twitch response elicited;Palpable increased muscle length             PT Short Term Goals - 07/14/18 1023      PT SHORT TERM GOAL #1   Title  ind with initial HEP    Time  4    Period  Weeks    Status  Achieved      PT SHORT TERM GOAL #2   Title  Pt will report at least 25% less pain    Time  4    Period  Weeks    Status  Achieved        PT Long Term Goals - 07/04/18 1742      PT LONG TERM GOAL #1   Title  pt will be ind with advanced HEP    Time  8    Period  Weeks    Status  New    Target Date  08/26/18      PT LONG TERM GOAL #2   Title  pt will report 75% less pain     Time  8    Period  Weeks    Status  New    Target Date  08/26/18      PT LONG TERM GOAL #3   Title  pt will demonstrate ability to engage TrA during transitional movements for improved functional  activites with decreased pain    Time  8    Period  Weeks    Status  New    Target Date  08/26/18      PT LONG TERM GOAL #4   Title  FOTO < or = to 26% limited    Time  8    Period  Weeks    Status  New    Target Date  08/26/18            Plan - 07/14/18 1022    Clinical Impression Statement  Patient reports her pain is 25% better. Patient is taking half the amount of Ibuprofen. Patient is able to engage the core muscles with exercises correctly. Patient needs tactile cues to contract the abdominals and reduce lumbar lordosis with quadruped lift extremity. Patient will benefit from skilled therapy to improve functional activities for reduced risk of injury.     Rehab Potential  Excellent    PT Frequency  1x / week    PT Duration  8 weeks    PT Treatment/Interventions  ADLs/Self Care Home Management;Cryotherapy;Electrical Stimulation;Moist Heat;Traction;Therapeutic activities;Therapeutic exercise;Neuromuscular re-education;Patient/family education;Manual techniques;Dry needling;Taping    PT Next Visit Plan  dry needling to lumbar and gluteal,  work on core stabilization in quadruped, and sitting    PT Home Exercise Plan   Access Code: [email or web address]     Recommended Other Services  MD signed initial eval    Consulted and Agree with Plan of Care  Patient       Patient will benefit from skilled therapeutic intervention in order to improve the following deficits and impairments:  Pain, Increased fascial restricitons, Increased muscle spasms, Postural dysfunction, Decreased strength, Decreased range of motion  Visit Diagnosis: Chronic low back pain, unspecified back pain laterality, unspecified whether sciatica present  Muscle weakness (generalized)  Muscle spasm of back     Problem List Patient Active Problem List   Diagnosis Date Noted  . Tachycardia 05/24/2017  . Insomnia  04/23/2017  . Hyperlipidemia 04/23/2017  . Obesity (BMI 30.0-34.9) 04/23/2017  . Interstitial cystitis  08/15/2014  . Generalized anxiety disorder 08/15/2014  . GERD (gastroesophageal reflux disease) 08/15/2014  . Thyroid disease   . Vitamin D deficiency     Earlie Counts, PT 07/14/18 10:59 AM   [facility] [address], [place], [place], [address] Phone: [phone]   Fax:  [phone]  Name: AVAIYAH STRUBEL MRN: [id number] Date of Birth: June 08, 1970  PHYSICAL THERAPY DISCHARGE SUMMARY  Visits from Start of Care: 3  Current functional level related to goals / functional outcomes: Unable to assess patient due to not returning to therapy. Patient has not returned physical therapist phone calls.    Remaining deficits: See above.    Education / Equipment: HEP Plan:                                                    Patient goals were not met. Patient is being discharged due to not returning since the last visit.  Thank you for referral. Earlie Counts, PT 10/19/18 10:20 AM  ?????

## 2018-07-16 ENCOUNTER — Other Ambulatory Visit: Payer: Self-pay | Admitting: Physician Assistant

## 2018-07-16 MED FILL — ALPRAZolam 0.5 MG TABS: 0.5 | 75 days supply | Qty: 90 | Fill #0

## 2018-07-23 ENCOUNTER — Encounter: Payer: 59 | Admitting: Physical Therapy

## 2018-07-29 ENCOUNTER — Encounter: Payer: 59 | Admitting: Physical Therapy

## 2018-07-30 MED FILL — OXYBUTYNIN CL ER 10 MG TAB: 10 | 90 days supply | Qty: 90 | Fill #1

## 2018-08-02 ENCOUNTER — Other Ambulatory Visit: Payer: Self-pay | Admitting: Physician Assistant

## 2018-08-02 MED FILL — LEVOTHYROXINE 112 MCG TAB: 112 | 90 days supply | Qty: 90 | Fill #0

## 2018-08-06 ENCOUNTER — Ambulatory Visit: Payer: 59 | Admitting: Physical Therapy

## 2018-08-13 ENCOUNTER — Encounter: Payer: 59 | Admitting: Physical Therapy

## 2018-08-17 DIAGNOSIS — J449 Chronic obstructive pulmonary disease, unspecified: Secondary | ICD-10-CM | POA: Diagnosis not present

## 2018-08-17 DIAGNOSIS — J189 Pneumonia, unspecified organism: Secondary | ICD-10-CM | POA: Diagnosis not present

## 2018-08-17 DIAGNOSIS — R0902 Hypoxemia: Secondary | ICD-10-CM | POA: Diagnosis not present

## 2018-08-17 DIAGNOSIS — I429 Cardiomyopathy, unspecified: Secondary | ICD-10-CM | POA: Diagnosis not present

## 2018-08-20 ENCOUNTER — Encounter: Payer: 59 | Admitting: Physical Therapy

## 2018-08-25 DIAGNOSIS — J9600 Acute respiratory failure, unspecified whether with hypoxia or hypercapnia: Secondary | ICD-10-CM | POA: Diagnosis not present

## 2018-08-25 DIAGNOSIS — E119 Type 2 diabetes mellitus without complications: Secondary | ICD-10-CM | POA: Diagnosis not present

## 2018-08-25 DIAGNOSIS — I10 Essential (primary) hypertension: Secondary | ICD-10-CM | POA: Diagnosis not present

## 2018-08-25 DIAGNOSIS — I6389 Other cerebral infarction: Secondary | ICD-10-CM | POA: Diagnosis not present

## 2018-08-25 DIAGNOSIS — J9601 Acute respiratory failure with hypoxia: Secondary | ICD-10-CM | POA: Diagnosis not present

## 2018-08-25 DIAGNOSIS — I63511 Cerebral infarction due to unspecified occlusion or stenosis of right middle cerebral artery: Secondary | ICD-10-CM | POA: Diagnosis not present

## 2018-08-25 DIAGNOSIS — E1165 Type 2 diabetes mellitus with hyperglycemia: Secondary | ICD-10-CM | POA: Diagnosis not present

## 2018-08-26 DIAGNOSIS — I6389 Other cerebral infarction: Secondary | ICD-10-CM | POA: Diagnosis not present

## 2018-08-26 DIAGNOSIS — I10 Essential (primary) hypertension: Secondary | ICD-10-CM | POA: Diagnosis not present

## 2018-08-26 DIAGNOSIS — J9601 Acute respiratory failure with hypoxia: Secondary | ICD-10-CM | POA: Diagnosis not present

## 2018-08-26 DIAGNOSIS — E1165 Type 2 diabetes mellitus with hyperglycemia: Secondary | ICD-10-CM | POA: Diagnosis not present

## 2018-08-27 ENCOUNTER — Telehealth: Payer: 59 | Admitting: Adult Health

## 2018-08-27 DIAGNOSIS — I10 Essential (primary) hypertension: Secondary | ICD-10-CM | POA: Diagnosis not present

## 2018-08-27 DIAGNOSIS — E1165 Type 2 diabetes mellitus with hyperglycemia: Secondary | ICD-10-CM | POA: Diagnosis not present

## 2018-08-27 DIAGNOSIS — J9601 Acute respiratory failure with hypoxia: Secondary | ICD-10-CM | POA: Diagnosis not present

## 2018-08-27 DIAGNOSIS — I6389 Other cerebral infarction: Secondary | ICD-10-CM | POA: Diagnosis not present

## 2018-08-28 DIAGNOSIS — I6389 Other cerebral infarction: Secondary | ICD-10-CM | POA: Diagnosis not present

## 2018-08-28 DIAGNOSIS — E1165 Type 2 diabetes mellitus with hyperglycemia: Secondary | ICD-10-CM | POA: Diagnosis not present

## 2018-08-28 DIAGNOSIS — J9601 Acute respiratory failure with hypoxia: Secondary | ICD-10-CM | POA: Diagnosis not present

## 2018-08-28 DIAGNOSIS — I10 Essential (primary) hypertension: Secondary | ICD-10-CM | POA: Diagnosis not present

## 2018-08-29 DIAGNOSIS — I10 Essential (primary) hypertension: Secondary | ICD-10-CM | POA: Diagnosis not present

## 2018-08-29 DIAGNOSIS — E1165 Type 2 diabetes mellitus with hyperglycemia: Secondary | ICD-10-CM | POA: Diagnosis not present

## 2018-08-29 DIAGNOSIS — I6389 Other cerebral infarction: Secondary | ICD-10-CM | POA: Diagnosis not present

## 2018-08-29 DIAGNOSIS — J9601 Acute respiratory failure with hypoxia: Secondary | ICD-10-CM | POA: Diagnosis not present

## 2018-09-06 NOTE — Progress Notes (Deleted)
FOLLOW UP  Assessment and Plan:   Tachycardia Continue to cut down on caffeine, ETOH, smoking Continue toprol; symptoms have improved/resolved  Gastroesophageal reflux disease with esophagitis Continue PPI/H2 blocker, diet discussed  Thyroid disease Hypothyroidism-check TSH level, continue medications the same, reminded to take on an empty stomach 30-27mins before food.  -     TSH  Interstitial cystitis Avoid triggers, doing well with oxybutynin  Vitamin D deficiency Near goal at recent check; continue to recommend supplementation for goal of 70-100 Defer vitamin D level  Generalized anxiety disorder/insomnia continue medications, stress management techniques discussed, increase water, good sleep hygiene discussed, increase exercise, and increase veggies.  Medication management -     CMP/GFR -     Magnesium  Hyperlipidemia Working on lifestyle modification, if LDL remains 130+ will initiate rosuvastatin Discussed with patient and she is in agreement Continue low cholesterol diet and exercise.  Check lipid panel.  -     Lipid panel  Obesity Long discussion about weight loss, diet, and exercise Recommended diet heavy in fruits and veggies and low in animal meats, cheeses, and dairy products, appropriate calorie intake Patient will work on portion control, exercise, training for 1/2 marathon Discussed appropriate weight for height and initial goal (<200lb) Follow up at next visit  Continue diet and meds as discussed. Further disposition pending results of labs. Discussed med's effects and SE's.   Over 30 minutes of exam, counseling, chart review, and critical decision making was performed.   Future Appointments  Date Time Provider [place]  09/07/2018 10:30 AM Liane Comber, NP [facility] None  12/02/2018  9:00 AM Liane Comber, NP [facility] None     ----------------------------------------------------------------------------------------------------------------------  HPI 48 y.o. female  presents for 3 month follow up on anxiety/insomnia, tachycardia, cholesterol, glucose management, weight and vitamin D deficiency.   She is engaged to get married Feb 26, 2019.   She saw Dr. McDiarmid for ICS, is now on oxybutynin and reports nearly fully resolved symptoms on med.   She has had ongoing tachycardia/palpitations and was having dyspnea associated with this and was worked up by cardiology; Holter showed some PVCs/PACs, ECHO from 10/2017 was unremarkable. She is cutting back on caffeine/ETOH and quit smoking by chantix. She is on toprol with improvement in symptoms.   She has been prescribed xanax PRN for anxiety; she currently uses 0.25 mg at night if needed to sleep. She uses lexapro 20 mg daily.   she has a diagnosis of GERD which is currently managed by *** she reports symptoms {ACTION; ARE/ARE [id number]} currently well controlled, and denies breakthrough reflux, burning in chest, hoarseness or cough.    BMI is There is no height or weight on file to calculate BMI., she has been working on diet and exercise, wants to run 1/2 marathon Wt Readings from Last 3 Encounters:  06/08/18 214 lb (97.1 kg)  06/01/18 215 lb 9.6 oz (97.8 kg)  01/21/18 214 lb 12.8 oz (97.4 kg)   Her blood pressure has been controlled at home, today their BP is    She does workout. She denies chest pain, shortness of breath, dizziness.   She is not on cholesterol medication. Her cholesterol is not at goal. The cholesterol last visit was:   Lab Results  Component Value Date   CHOL 224 (H) 06/08/2018   HDL 66 06/08/2018   LDLCALC 139 (H) 06/08/2018   TRIG 86 06/08/2018   CHOLHDL 3.4 06/08/2018   Last A1C in the office was:  Lab Results  Component Value Date   HGBA1C 5.3 12/01/2017   She is on thyroid medication. Her medication was not changed last  visit.   Lab Results  Component Value Date   TSH 7.32 (H) 06/08/2018   Patient is on Vitamin D supplement.   Lab Results  Component Value Date   VD25OH 52 12/01/2017       Current Medications:  Current Outpatient Medications on File Prior to Visit  Medication Sig  . ALPRAZolam (XANAX) 0.5 MG tablet TAKE 1 TABLET BY MOUTH 3 TIMES A DAY AS NEEDED FOR ANXIETY. TRY TO CUT BACK ON HOW MUCH TAKING, NEEDS TO LAST CLOSE TO 3 MONTHS  . Cholecalciferol (VITAMIN D PO) Take 4,000 Units by mouth.  . cyclobenzaprine (FLEXERIL) 10 MG tablet Take 1 tablet (10 mg total) by mouth every 8 (eight) hours as needed for muscle spasms.  Marland Kitchen escitalopram (LEXAPRO) 20 MG tablet TAKE 1 TABLET BY MOUTH ONCE DAILY  . fluticasone (FLONASE) 50 MCG/ACT nasal spray Place 2 sprays into both nostrils daily.  Marland Kitchen levothyroxine (SYNTHROID, LEVOTHROID) 112 MCG tablet TAKE 1 TABLET BY MOUTH ONCE DAILY BEFORE BREAKFAST.  . metoprolol succinate (TOPROL-XL) 50 MG 24 hr tablet Take 1 tablet (50 mg total) by mouth daily. Take with or immediately following a meal. (Patient taking differently: Take 25 mg by mouth daily. Take with or immediately following a meal.)  . omeprazole (PRILOSEC) 20 MG capsule Take 1 capsule (20 mg total) by mouth 2 (two) times daily. (Patient taking differently: Take 20 mg by mouth as needed. )  . oxybutynin (DITROPAN XL) 10 MG 24 hr tablet Take 1 tablet daily for Bladder  . rosuvastatin (CRESTOR) 5 MG tablet Take 1 tablet (5 mg total) by mouth daily.   No current facility-administered medications on file prior to visit.      Allergies:  Allergies  Allergen Reactions  . Amoxicillin Rash and Other (See Comments)    FEVER  . Bisoprolol Anxiety  . Metoprolol Tartrate Palpitations  . Verapamil Rash     Medical History:  Past Medical History:  Diagnosis Date  . Anxiety   . Breast hypertrophy 03/2014  . GERD (gastroesophageal reflux disease)   . Hyperlipidemia   . Hypothyroidism   . Interstitial  cystitis    Family history- Reviewed and unchanged Social history- Reviewed and unchanged   Review of Systems:  Review of Systems  Constitutional: Negative for malaise/fatigue and weight loss.  HENT: Negative for hearing loss and tinnitus.   Eyes: Negative for blurred vision and double vision.  Respiratory: Negative for cough, shortness of breath and wheezing.   Cardiovascular: Negative for chest pain, palpitations, orthopnea, claudication and leg swelling.  Gastrointestinal: Negative for abdominal pain, blood in stool, constipation, diarrhea, heartburn, melena, nausea and vomiting.  Genitourinary: Negative.   Musculoskeletal: Negative for joint pain and myalgias.  Skin: Negative for rash.  Neurological: Negative for dizziness, tingling, sensory change, weakness and headaches.  Endo/Heme/Allergies: Negative for polydipsia.  Psychiatric/Behavioral: The patient is nervous/anxious and has insomnia.   All other systems reviewed and are negative.     Physical Exam: There were no vitals taken for this visit. Wt Readings from Last 3 Encounters:  06/08/18 214 lb (97.1 kg)  06/01/18 215 lb 9.6 oz (97.8 kg)  01/21/18 214 lb 12.8 oz (97.4 kg)   General Appearance: Well nourished, in no apparent distress. Eyes: PERRLA, EOMs, conjunctiva no swelling or erythema Sinuses: No Frontal/maxillary tenderness ENT/Mouth: Ext aud canals clear, TMs without erythema, bulging. No erythema,  swelling, or exudate on post pharynx.  Tonsils not swollen or erythematous. Hearing normal.  Neck: Supple, thyroid normal.  Respiratory: Respiratory effort normal, BS equal bilaterally without rales, rhonchi, wheezing or stridor.  Cardio: RRR with no MRGs. Brisk peripheral pulses without edema.  Abdomen: Soft, + BS.  Non tender, no guarding, rebound, hernias, masses. Lymphatics: Non tender without lymphadenopathy.  Musculoskeletal: Full ROM, 5/5 strength, Normal gait Skin: Warm, dry without rashes, lesions,  ecchymosis.  Neuro: Cranial nerves intact. No cerebellar symptoms.  Psych: Awake and oriented X 3, normal affect, Insight and Judgment appropriate.    [name], NP 9:46 AM [facility]

## 2018-09-07 ENCOUNTER — Ambulatory Visit: Payer: Self-pay | Admitting: Adult Health

## 2018-09-09 NOTE — Progress Notes (Signed)
Virtual Visit via Telephone Note  I connected with Shari Payne on 09/10/18 at 11:00 AM EDT by telephone and verified that I am speaking with the correct person using two identifiers.   I discussed the limitations, risks, security and privacy concerns of performing an evaluation and management service by telephone and the availability of in person appointments. I also discussed with the patient that there may be a patient responsible charge related to this service. The patient expressed understanding and agreed to proceed.  I discussed the assessment and treatment plan with the patient. The patient was provided an opportunity to ask questions and all were answered. The patient agreed with the plan and demonstrated an understanding of the instructions.   The patient was advised to call back or seek an in-person evaluation if the symptoms worsen or if the condition fails to improve as anticipated.  I provided 24 minutes of non-face-to-face time during this encounter.   Shari Ribas, NP     FOLLOW UP  Assessment and Plan:   Tachycardia Continue to cut down on caffeine, ETOH, smoking Continue toprol; symptoms have improved/resolved  Gastroesophageal reflux disease with esophagitis Continue PPI/H2 blocker, diet discussed Check magnesium   Thyroid disease Hypothyroidism-check TSH level, continue medications the same, reminded to take on an empty stomach 30-7mins before food.  -     TSH - labs entered, will get drawn at her office and forward results  Interstitial cystitis Avoid triggers, doing well with oxybutynin  Vitamin D deficiency Near goal at recent check; she has increased dose to taking 4000 IU since last visit continue to recommend supplementation for goal of 60-100 Defer vitamin D level  Generalized anxiety disorder/insomnia Currently well controlled; reminded to avoid using benzo daily  continue medications, stress management techniques discussed, increase  water, good sleep hygiene discussed, increase exercise, and increase veggies.  Medication management -     CMP/GFR -     Magnesium  Hyperlipidemia Working on lifestyle modification, if LDL remains 130+ will initiate rosuvastatin Discussed with patient and she is in agreement Continue low cholesterol diet and exercise.  Check lipid panel.  -     Lipid panel  Obesity Long discussion about weight loss, diet, and exercise Recommended diet heavy in fruits and veggies and low in animal meats, cheeses, and dairy products, appropriate calorie intake Patient will work on portion control, exercise, training for 1/2 marathon Discussed appropriate weight for height and initial goal (<200lb) She admits to eating "junk" - she plans to cut down on buying these, focus more on fresh whole foods, watching portions. She has successfully lost weight previously and will resume that plan.  Follow up at next visit  Continue diet and meds as discussed. Further disposition pending results of labs. Discussed med's effects and SE's.   Over 30 minutes of exam, counseling, chart review, and critical decision making was performed.   Future Appointments  Date Time Provider [place]  12/02/2018  9:00 AM Liane Comber, NP [facility] None    ----------------------------------------------------------------------------------------------------------------------  HPI 48 y.o. female  presents for 3 month follow up on anxiety/insomnia, tachycardia, cholesterol, glucose management, weight and vitamin D deficiency.   She is engaged to get married Feb 26, 2019.   She saw Dr. McDiarmid for ICS, is now on oxybutynin and reports nearly fully resolved symptoms on med.   She has had ongoing tachycardia/palpitations and was having dyspnea associated with this and was worked up by cardiology; Holter showed some PVCs/PACs, ECHO from 10/2017  was unremarkable. She is cutting back on caffeine/ETOH and quit smoking by  chantix. She is on toprol with improvement in symptoms.   She has been prescribed xanax PRN for anxiety; she currently uses 0.25 mg at night if needed to sleep. She uses lexapro 20 mg daily.   she has a diagnosis of GERD which is currently managed by omeprazole 20 mg daily she reports symptoms is currently well controlled, and denies breakthrough reflux, burning in chest, hoarseness or cough.    BMI is Body mass index is 32.08 kg/m., she has been working exercise, wants to run 1/2 marathon, has been training for this. Admits to dietary indiscretion.  Wt Readings from Last 3 Encounters:  09/10/18 211 lb (95.7 kg)  06/08/18 214 lb (97.1 kg)  06/01/18 215 lb 9.6 oz (97.8 kg)    She is not checking BPs at home, typically well controlled, today their BP is    She is on metoprolol 25 mg daily for heart rate; pulse 81 today. Much improved since cutting down with lifestyle modification, no recent issues. She will take additional 25 mg PRN.   She does workout. She denies chest pain, shortness of breath, dizziness.   She is on cholesterol medication (rosuvastatin 5 mg) but reports she has stopped taking due to possible myalgias but didn't make a difference. She is willing to restart. Her cholesterol is not at goal. The cholesterol last visit was:   Lab Results  Component Value Date   CHOL 224 (H) 06/08/2018   HDL 66 06/08/2018   LDLCALC 139 (H) 06/08/2018   TRIG 86 06/08/2018   CHOLHDL 3.4 06/08/2018   Last A1C in the office was:  Lab Results  Component Value Date   HGBA1C 5.3 12/01/2017   She is on thyroid medication. Her medication was not changed last visit.   Lab Results  Component Value Date   TSH 7.32 (H) 06/08/2018   Patient is on Vitamin D supplement, now taking 4000 IU daily.    Lab Results  Component Value Date   VD25OH 52 12/01/2017       Current Medications:  Current Outpatient Medications on File Prior to Visit  Medication Sig  . ALPRAZolam (XANAX) 0.5 MG tablet  TAKE 1 TABLET BY MOUTH 3 TIMES A DAY AS NEEDED FOR ANXIETY. TRY TO CUT BACK ON HOW MUCH TAKING, NEEDS TO LAST CLOSE TO 3 MONTHS  . Cholecalciferol (VITAMIN D PO) Take 4,000 Units by mouth.  . cyclobenzaprine (FLEXERIL) 10 MG tablet Take 1 tablet (10 mg total) by mouth every 8 (eight) hours as needed for muscle spasms.  Marland Kitchen escitalopram (LEXAPRO) 20 MG tablet TAKE 1 TABLET BY MOUTH ONCE DAILY  . fluticasone (FLONASE) 50 MCG/ACT nasal spray Place 2 sprays into both nostrils daily.  Marland Kitchen levothyroxine (SYNTHROID, LEVOTHROID) 112 MCG tablet TAKE 1 TABLET BY MOUTH ONCE DAILY BEFORE BREAKFAST.  . metoprolol succinate (TOPROL-XL) 50 MG 24 hr tablet Take 1 tablet (50 mg total) by mouth daily. Take with or immediately following a meal. (Patient taking differently: Take 25 mg by mouth daily. Take with or immediately following a meal.)  . omeprazole (PRILOSEC) 20 MG capsule Take 1 capsule (20 mg total) by mouth 2 (two) times daily. (Patient taking differently: Take 20 mg by mouth as needed. )  . oxybutynin (DITROPAN XL) 10 MG 24 hr tablet Take 1 tablet daily for Bladder  . rosuvastatin (CRESTOR) 5 MG tablet Take 1 tablet (5 mg total) by mouth daily.   No current  facility-administered medications on file prior to visit.      Allergies:  Allergies  Allergen Reactions  . Amoxicillin Rash and Other (See Comments)    FEVER  . Bisoprolol Anxiety  . Metoprolol Tartrate Palpitations  . Verapamil Rash     Medical History:  Past Medical History:  Diagnosis Date  . Anxiety   . Breast hypertrophy 03/2014  . GERD (gastroesophageal reflux disease)   . Hyperlipidemia   . Hypothyroidism   . Interstitial cystitis    Family history- Reviewed and unchanged Social history- Reviewed and unchanged   Review of Systems:  Review of Systems  Constitutional: Negative for malaise/fatigue and weight loss.  HENT: Negative for hearing loss and tinnitus.   Eyes: Negative for blurred vision and double vision.   Respiratory: Negative for cough, shortness of breath and wheezing.   Cardiovascular: Negative for chest pain, palpitations, orthopnea, claudication and leg swelling.  Gastrointestinal: Negative for abdominal pain, blood in stool, constipation, diarrhea, heartburn, melena, nausea and vomiting.  Genitourinary: Negative.   Musculoskeletal: Negative for joint pain and myalgias.  Skin: Negative for rash.  Neurological: Negative for dizziness, tingling, sensory change, weakness and headaches.  Endo/Heme/Allergies: Negative for polydipsia.  Psychiatric/Behavioral: Negative for depression, substance abuse and suicidal ideas. The patient has insomnia. The patient is not nervous/anxious.   All other systems reviewed and are negative.     Physical Exam: Pulse 81   Wt 211 lb (95.7 kg)   BMI 32.08 kg/m  Wt Readings from Last 3 Encounters:  09/10/18 211 lb (95.7 kg)  06/08/18 214 lb (97.1 kg)  06/01/18 215 lb 9.6 oz (97.8 kg)   General : Well sounding patient in no apparent distress HEENT: no hoarseness, no cough for duration of visit Lungs: speaks in complete sentences, no audible wheezing, no apparent distress Neurological: alert, oriented x 3 Psychiatric: pleasant, judgement appropriate    Shari Ribas, NP 11:09 AM [facility]

## 2018-09-10 ENCOUNTER — Ambulatory Visit: Payer: 59 | Admitting: Adult Health

## 2018-09-10 ENCOUNTER — Encounter: Payer: Self-pay | Admitting: Adult Health

## 2018-09-10 ENCOUNTER — Other Ambulatory Visit: Payer: Self-pay

## 2018-09-10 VITALS — HR 81 | Wt 211.0 lb

## 2018-09-10 DIAGNOSIS — E782 Mixed hyperlipidemia: Secondary | ICD-10-CM

## 2018-09-10 DIAGNOSIS — F411 Generalized anxiety disorder: Secondary | ICD-10-CM | POA: Diagnosis not present

## 2018-09-10 DIAGNOSIS — F99 Mental disorder, not otherwise specified: Secondary | ICD-10-CM

## 2018-09-10 DIAGNOSIS — E669 Obesity, unspecified: Secondary | ICD-10-CM

## 2018-09-10 DIAGNOSIS — R000 Tachycardia, unspecified: Secondary | ICD-10-CM

## 2018-09-10 DIAGNOSIS — E559 Vitamin D deficiency, unspecified: Secondary | ICD-10-CM

## 2018-09-10 DIAGNOSIS — E079 Disorder of thyroid, unspecified: Secondary | ICD-10-CM | POA: Diagnosis not present

## 2018-09-10 DIAGNOSIS — F5105 Insomnia due to other mental disorder: Secondary | ICD-10-CM

## 2018-09-10 DIAGNOSIS — K2100 Gastro-esophageal reflux disease with esophagitis, without bleeding: Secondary | ICD-10-CM

## 2018-09-13 ENCOUNTER — Other Ambulatory Visit: Payer: Self-pay

## 2018-09-17 DEATH — deceased

## 2018-09-20 ENCOUNTER — Telehealth: Payer: Self-pay | Admitting: Physical Therapy

## 2018-09-20 NOTE — Telephone Encounter (Signed)
Called patient to see if patient wanted to resume therapy. Left a message.  Earlie Counts, PT @5 /08/2018@ 11:55 AM

## 2018-09-25 MED FILL — ESCITALOPRAM 20 MG TABLET: 20 | 90 days supply | Qty: 90 | Fill #0

## 2018-10-19 ENCOUNTER — Telehealth: Payer: Self-pay | Admitting: Physical Therapy

## 2018-10-19 NOTE — Telephone Encounter (Signed)
Called patient to see if she will be returning to therapy and left a message.  Earlie Counts, PT @6 /06/2018@ 10:09 AM

## 2018-10-21 MED FILL — METOPROLOL SUCCINATE ER 50: 50 | 90 days supply | Qty: 90 | Fill #1

## 2018-10-29 ENCOUNTER — Other Ambulatory Visit: Payer: Self-pay | Admitting: Internal Medicine

## 2018-10-29 ENCOUNTER — Other Ambulatory Visit: Payer: Self-pay | Admitting: Adult Health

## 2018-10-29 MED ORDER — OXYBUTYNIN CHLORIDE ER 10 MG PO TB24: ORAL_TABLET | ORAL | 1 refills | Status: DC

## 2018-10-29 MED FILL — OXYBUTYNIN CL ER 10 MG TAB: 10 | 90 days supply | Qty: 90 | Fill #0

## 2018-11-08 MED FILL — LEVOTHYROXINE 112 MCG TAB: 112 | 90 days supply | Qty: 90 | Fill #1

## 2018-12-02 ENCOUNTER — Encounter: Payer: Self-pay | Admitting: Adult Health

## 2018-12-15 NOTE — Progress Notes (Signed)
Complete Physical  Assessment and Plan:  Encounter for general adult medical examination with abnormal findings  Tachycardia Continue to cut down on caffeine, ETOH, smoking Continue toprol; symptoms have improved/resolved Followed by cardiology; titrating BB with them - denies symptoms of bradycardia which is noted on EKG  Gastroesophageal reflux disease with esophagitis Symptoms well managed without breakthrough Will try to get off PPI given info for taper and pepcid sent in  Thyroid disease Hypothyroidism-check TSH level, continue medications the same, reminded to take on an empty stomach 30-53mins before food.  -     TSH  OAB Avoid triggers, continue oxybutynin  Anemia, unspecified type - monitor, continue iron supp with Vitamin C and increase green leafy veggies -     CBC with Differential/Platelet -     Vitamin B12  Vitamin D deficiency -     VITAMIN D 25 Hydroxy (Vit-D Deficiency, Fractures)  Generalized anxiety disorder/insomnia continue medications, stress management techniques discussed, increase water, good sleep hygiene discussed, increase exercise, and increase veggies.   Medication management -     CMP/GFR -     Magnesium  Hyperlipidemia -     Lipid panel  Screening for blood or protein in urine -     Urinalysis, Routine w reflex microscopic -     Microalbumin / creatinine urine ratio  Screening diabetes      -      A1C  Discussed med's effects and SE's. Screening labs and tests as requested with regular follow-up as recommended. Over 40 minutes of exam, counseling, chart review, and critical decision making was performed this visit.   Future Appointments  Date Time Provider [place]  12/21/2019 10:00 AM Liane Comber, NP [facility] None     HPI  48 y.o. female presents for a complete physical. She has Thyroid disease; Vitamin D deficiency; Interstitial cystitis; Generalized anxiety disorder; GERD (gastroesophageal reflux disease);  Insomnia; Hyperlipidemia; Obesity (BMI 30.0-34.9); and Tachycardia on their problem list.   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 She has 68 year old son.   She is following with her OB/GYN, Dr. Melba Coon, for OAB on oxybutynin; pt reports she saw [facility] several years ago who r/o previously suspected interstitial cystitis and she reports has done well since with this medication.   She has had ongoing tachycardia/palpitations and was having dyspnea associated with this and was worked up by cardiology; Holter showed some PVCs/PACs, ECHO from 10/2017 was unremarkable. She is cutting back on caffeine (1-2 per day)/ETOH and trying to cut down on smoking. She is on toprol with improvement in symptoms.   She has been prescribed lexapro 20 mg daily and xanax PRN for anxiety; she currently uses 0.25 mg at night if needed to sleep.   She currently takes omeprazole 20 mg daily for GERD and reports well controlled without breakthrough. Denies cough, hoarseness.   BMI is Body mass index is 33.09 kg/m., she has been working on diet and exercise. She aims to get 150 min of walking/running weekly.  Wt Readings from Last 3 Encounters:  12/16/18 217 lb 9.6 oz (98.7 kg)  09/10/18 211 lb (95.7 kg)  06/08/18 214 lb (97.1 kg)   Her blood pressure has been controlled at home, today their BP is BP: 110/72 She does workout. She denies chest pain, shortness of breath, dizziness.   She is on cholesterol medication (newly on rosuvastatin 5 mg once a week) and denies myalgias. Her cholesterol is not at  goal. The cholesterol last visit was:   Lab Results  Component Value Date   CHOL 224 (H) 06/08/2018   HDL 66 06/08/2018   LDLCALC 139 (H) 06/08/2018   TRIG 86 06/08/2018   CHOLHDL 3.4 06/08/2018   Last A1C in the office was:   Lab Results  Component Value Date   HGBA1C 5.3 12/01/2017   Patient is on Vitamin D supplement, 5000 IU daily  Lab Results  Component  Value Date   VD25OH 62 12/01/2017   She is on thyroid medication. Her medication was not changed last visit. taking 112 mcg daily on empty stomach.   Lab Results  Component Value Date   TSH 7.32 (H) 06/08/2018       Current Medications:  Current Outpatient Medications on File Prior to Visit  Medication Sig Dispense Refill  . ALPRAZolam (XANAX) 0.5 MG tablet TAKE 1 TABLET BY MOUTH 3 TIMES A DAY AS NEEDED FOR ANXIETY. TRY TO CUT BACK ON HOW MUCH TAKING, NEEDS TO LAST CLOSE TO 3 MONTHS 90 tablet 0  . Cholecalciferol (VITAMIN D PO) Take 5,000 Units by mouth daily.    Marland Kitchen escitalopram (LEXAPRO) 20 MG tablet TAKE 1 TABLET BY MOUTH ONCE DAILY 90 tablet 1  . fluticasone (FLONASE) 50 MCG/ACT nasal spray Place 2 sprays into both nostrils daily. (Patient taking differently: Place 2 sprays into both nostrils as needed. ) 16 g 0  . levothyroxine (SYNTHROID, LEVOTHROID) 112 MCG tablet TAKE 1 TABLET BY MOUTH ONCE DAILY BEFORE BREAKFAST. 90 tablet 4  . metoprolol succinate (TOPROL-XL) 50 MG 24 hr tablet Take 1 tablet (50 mg total) by mouth daily. Take with or immediately following a meal. 90 tablet 3  . omeprazole (PRILOSEC) 20 MG capsule Take 1 capsule (20 mg total) by mouth 2 (two) times daily. (Patient taking differently: Take 20 mg by mouth as needed. ) 180 capsule 1  . oxybutynin (DITROPAN-XL) 10 MG 24 hr tablet TAKE 1 TABLET BY MOUTH DAILY FOR BLADDER 90 tablet 1  . rosuvastatin (CRESTOR) 5 MG tablet Take 1 tablet (5 mg total) by mouth daily. (Patient taking differently: Take 5 mg by mouth once a week. ) 90 tablet 1  . cyclobenzaprine (FLEXERIL) 10 MG tablet Take 1 tablet (10 mg total) by mouth every 8 (eight) hours as needed for muscle spasms. (Patient not taking: Reported on 09/10/2018) 60 tablet 1   No current facility-administered medications on file prior to visit.    Health Maintenance:    Immunization History  Administered Date(s) Administered  . Influenza, Seasonal, Injecte, Preservative  Fre 02/26/2016  . 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 Pap: Dr. Melba Coon, 09/2015 DUE - patient will schedule MGM: 08/2017 will follow up with GYN DEXA: N/A Colonoscopy: N/A EGD: N/A CXR 08/2013  Last Dental Exam: Dr. Marland Kitchen 2019, q90m Last Eye Exam: [facility] Ser, Dr. Alease Medina, 2020, wears glasses  Patient Care Team: Unk Pinto, MD as PCP - General (Internal Medicine) Stanford Breed Denice Bors, MD as PCP - Cardiology (Cardiology)  Medical History:  Past Medical History:  Diagnosis Date  . Anxiety   . Breast hypertrophy 03/2014  . GERD (gastroesophageal reflux disease)   . Hyperlipidemia   . Hypothyroidism   . Interstitial cystitis    Allergies Allergies  Allergen Reactions  . Amoxicillin Rash and Other (See Comments)    FEVER  .  Bisoprolol Anxiety  . Metoprolol Tartrate Palpitations  . Verapamil Rash    SURGICAL HISTORY She  has a past surgical history that includes Breast reduction surgery (Bilateral, 04/18/2014) and Breast biopsy (Left). FAMILY HISTORY Her family history includes CVA (age of onset: 78) in her maternal grandfather; Heart failure in her paternal grandfather; Osteosarcoma (age of onset: 49) in her sister. SOCIAL HISTORY She  reports that she has been smoking cigarettes. She has a 15.00 pack-year smoking history. She has never used smokeless tobacco. She reports current alcohol use of about 10.0 standard drinks of alcohol per week. She reports that she does not use drugs.  Review of Systems: Review of Systems  Constitutional: Negative.  Negative for malaise/fatigue and weight loss.  HENT: Negative.  Negative for hearing loss and tinnitus.   Eyes: Negative.  Negative for blurred vision and double vision.  Respiratory: Negative.  Negative for cough, sputum production, shortness of breath and wheezing.    Cardiovascular: Negative.  Negative for chest pain, palpitations, orthopnea, claudication, leg swelling and PND.  Gastrointestinal: Negative for abdominal pain, blood in stool, constipation, diarrhea, heartburn, melena, nausea and vomiting.  Genitourinary: Negative for dysuria, flank pain, frequency, hematuria and urgency.  Musculoskeletal: Negative.  Negative for falls, joint pain and myalgias.  Skin: Negative.  Negative for rash.  Neurological: Negative.  Negative for dizziness, tingling, sensory change, weakness and headaches.  Endo/Heme/Allergies: Negative for polydipsia.  Psychiatric/Behavioral: Negative for depression, memory loss, substance abuse and suicidal ideas. The patient is not nervous/anxious and does not have insomnia.   All other systems reviewed and are negative.   Physical Exam: Estimated body mass index is 33.09 kg/m as calculated from the following:   Height as of this encounter: 5\' 8"  (1.727 m).   Weight as of this encounter: 217 lb 9.6 oz (98.7 kg). BP 110/72   Pulse 85   Temp (!) 97.5 F (36.4 C)   Ht 5\' 8"  (1.727 m)   Wt 217 lb 9.6 oz (98.7 kg)   LMP 12/08/2018 (Exact Date)   SpO2 97%   BMI 33.09 kg/m   Wt Readings from Last 3 Encounters:  12/16/18 217 lb 9.6 oz (98.7 kg)  09/10/18 211 lb (95.7 kg)  06/08/18 214 lb (97.1 kg)   General Appearance: Well nourished, in no apparent distress.  Eyes: PERRLA, EOMs, conjunctiva no swelling or erythema, normal fundi and vessels.  Sinuses: No Frontal/maxillary tenderness  ENT/Mouth: Ext aud canals clear, normal light reflex with TMs without erythema, bulging. Good dentition. No erythema, swelling, or exudate on post pharynx. She has large left tonsil, R side not visible, no erythema, irregular texture or appearance of lesion (chronic and unchanged per patient), Hearing normal.  Neck: Supple, thyroid normal. No bruits  Respiratory: Respiratory effort normal, BS equal bilaterally without rales, rhonchi, wheezing or  stridor.  Cardio: RRR without murmurs, rubs or gallops. Brisk peripheral pulses without edema.  Chest: symmetric, with normal excursions and percussion.  Breasts: defer to GYN Abdomen: Soft, nontender, no guarding, rebound, hernias, masses, or organomegaly.  Lymphatics: Non tender without lymphadenopathy.  Genitourinary: defer to GYN Musculoskeletal: Full ROM all peripheral extremities,5/5 strength, and normal gait.  Skin: Warm, dry without rashes, lesions, ecchymosis. Neuro: Cranial nerves intact, reflexes equal bilaterally. Normal muscle tone, no cerebellar symptoms. Sensation intact.  Psych: Awake and oriented X 3, normal affect, Insight and Judgment appropriate.   EKG: Sinus brady, NSCPT  Gorden Harms Lilit Cinelli 10:34 AM [facility]

## 2018-12-16 ENCOUNTER — Other Ambulatory Visit: Payer: Self-pay

## 2018-12-16 ENCOUNTER — Encounter: Payer: Self-pay | Admitting: Adult Health

## 2018-12-16 ENCOUNTER — Ambulatory Visit (INDEPENDENT_AMBULATORY_CARE_PROVIDER_SITE_OTHER): Payer: 59 | Admitting: Adult Health

## 2018-12-16 VITALS — BP 110/72 | HR 85 | Temp 97.5°F | Ht 68.0 in | Wt 217.6 lb

## 2018-12-16 DIAGNOSIS — Z114 Encounter for screening for human immunodeficiency virus [HIV]: Secondary | ICD-10-CM

## 2018-12-16 DIAGNOSIS — Z0000 Encounter for general adult medical examination without abnormal findings: Secondary | ICD-10-CM | POA: Diagnosis not present

## 2018-12-16 DIAGNOSIS — E079 Disorder of thyroid, unspecified: Secondary | ICD-10-CM

## 2018-12-16 DIAGNOSIS — I10 Essential (primary) hypertension: Secondary | ICD-10-CM | POA: Diagnosis not present

## 2018-12-16 DIAGNOSIS — D649 Anemia, unspecified: Secondary | ICD-10-CM

## 2018-12-16 DIAGNOSIS — N3281 Overactive bladder: Secondary | ICD-10-CM

## 2018-12-16 DIAGNOSIS — Z0001 Encounter for general adult medical examination with abnormal findings: Secondary | ICD-10-CM | POA: Diagnosis not present

## 2018-12-16 DIAGNOSIS — E559 Vitamin D deficiency, unspecified: Secondary | ICD-10-CM | POA: Diagnosis not present

## 2018-12-16 DIAGNOSIS — K2100 Gastro-esophageal reflux disease with esophagitis, without bleeding: Secondary | ICD-10-CM

## 2018-12-16 DIAGNOSIS — Z136 Encounter for screening for cardiovascular disorders: Secondary | ICD-10-CM | POA: Diagnosis not present

## 2018-12-16 DIAGNOSIS — E669 Obesity, unspecified: Secondary | ICD-10-CM

## 2018-12-16 DIAGNOSIS — F411 Generalized anxiety disorder: Secondary | ICD-10-CM

## 2018-12-16 DIAGNOSIS — F5105 Insomnia due to other mental disorder: Secondary | ICD-10-CM

## 2018-12-16 DIAGNOSIS — E66811 Obesity, class 1: Secondary | ICD-10-CM

## 2018-12-16 DIAGNOSIS — R000 Tachycardia, unspecified: Secondary | ICD-10-CM

## 2018-12-16 DIAGNOSIS — F99 Mental disorder, not otherwise specified: Secondary | ICD-10-CM

## 2018-12-16 DIAGNOSIS — F17200 Nicotine dependence, unspecified, uncomplicated: Secondary | ICD-10-CM

## 2018-12-16 DIAGNOSIS — E782 Mixed hyperlipidemia: Secondary | ICD-10-CM | POA: Diagnosis not present

## 2018-12-16 DIAGNOSIS — Z8639 Personal history of other endocrine, nutritional and metabolic disease: Secondary | ICD-10-CM

## 2018-12-16 MED ORDER — OMEPRAZOLE 20 MG PO CPDR: 20.0000 mg | DELAYED_RELEASE_CAPSULE | ORAL | 0 refills | Status: DC | PRN

## 2018-12-16 MED ORDER — ALPRAZOLAM 0.5 MG PO TABS: ORAL_TABLET | ORAL | 0 refills | Status: DC

## 2018-12-16 MED ORDER — FAMOTIDINE 20 MG PO TABS: 20.0000 mg | ORAL_TABLET | Freq: Two times a day (BID) | ORAL | 1 refills | Status: DC

## 2018-12-16 MED FILL — ALPRAZolam 0.5 MG TABS: 0.5 | 75 days supply | Qty: 90 | Fill #0

## 2018-12-16 MED FILL — OMEPRAZOLE 20 MG CAPSULE DR: 20 | 90 days supply | Qty: 90 | Fill #0

## 2018-12-16 MED FILL — SM ACID REDUCER 20 MG TAB: 20 | 75 days supply | Qty: 150 | Fill #0

## 2018-12-16 NOTE — Patient Instructions (Addendum)
[name] , Thank you for taking time to come for your Wellness Visit. I appreciate your ongoing commitment to your health goals. Please review the following plan we discussed and let me know if I can assist you in the future.   These are the goals we discussed: Goals    . LDL CALC < 100    . Quit Smoking    . Weight (lb) < 200 lb (90.7 kg)       This is a list of the screening recommended for you and due dates:  Health Maintenance  Topic Date Due  . HIV Screening  04/22/1988  . Pap Smear  09/17/2018  . Flu Shot  12/18/2018  . Tetanus Vaccine  08/14/2024     Schedule a follow up with Dr. Melba Coon     Know what a healthy weight is for you (roughly BMI <25) and aim to maintain this  Aim for 7+ servings of fruits and vegetables daily  65-80+ fluid ounces of water or unsweet tea for healthy kidneys  Limit to max 1 drink of alcohol per day; avoid smoking/tobacco  Limit animal fats in diet for cholesterol and heart health - choose grass fed whenever available  Avoid highly processed foods, and foods high in saturated/trans fats  Aim for low stress - take time to unwind and care for your mental health  Aim for 150 min of moderate intensity exercise weekly for heart health, and weights twice weekly for bone health  Aim for 7-9 hours of sleep daily     GETTING OFF OF PPI's    Nexium/protonix/prilosec/Omeprazole/Dexilant/Aciphex are called PPI's, they are great at healing your stomach but should only be taken for a short period of time.     Recent studies have shown that taken for a long time they  can increase the risk of osteoporosis (weakening of your bones), pneumonia, low magnesium, restless legs, Cdiff (infection that causes diarrhea), DEMENTIA and most recently kidney damage / disease / insufficiency.     Due to this information we want to try to stop the PPI but if you try to stop it abruptly this can cause rebound acid and worsening symptoms.   So this is how we  want you to get off the PPI: Generic is always fine!!  - Start taking the nexium/protonix/prilosec/PPI  every other day with  zantac (ranitidine) OR pepcid famotadine 2 x a day for 2-4 weeks - some people stay on this dosage and can not taper off further. Our main goal is to limit the dosage and amount you are taking so if you need to stay on this dose.   - then decrease the PPI to every 3 days while taking the zantac or pepcid  twice a day the other  days for 2-4  Weeks  - then you can try the zantac or pepcid once at night or up to 2 x day as needed.  - you can continue on this once at night or stop all together  - Avoid alcohol, spicy foods, NSAIDS (aleve, ibuprofen) at this time. See foods below.   +++++++++++++++++++++++++++++++++++++++++++  Food Choices for Gastroesophageal Reflux Disease  When you have gastroesophageal reflux disease (GERD), the foods you eat and your eating habits are very important. Choosing the right foods can help ease the discomfort of GERD. WHAT GENERAL GUIDELINES DO I NEED TO FOLLOW?  Choose fruits, vegetables, whole grains, low-fat dairy products, and low-fat meat, fish, and poultry.  Limit fats such as oils,  salad dressings, butter, nuts, and avocado.  Keep a food diary to identify foods that cause symptoms.  Avoid foods that cause reflux. These may be different for different people.  Eat frequent small meals instead of three large meals each day.  Eat your meals slowly, in a relaxed setting.  Limit fried foods.  Cook foods using methods other than frying.  Avoid drinking alcohol.  Avoid drinking large amounts of liquids with your meals.  Avoid bending over or lying down until 2-3 hours after eating.   WHAT FOODS ARE NOT RECOMMENDED? The following are some foods and drinks that may worsen your symptoms:  Vegetables Tomatoes. Tomato juice. Tomato and spaghetti sauce. Chili peppers. Onion and garlic. Horseradish. Fruits Oranges,  grapefruit, and lemon (fruit and juice). Meats High-fat meats, fish, and poultry. This includes hot dogs, ribs, ham, sausage, salami, and bacon. Dairy Whole milk and chocolate milk. Sour cream. Cream. Butter. Ice cream. Cream cheese.  Beverages Coffee and tea, with or without caffeine. Carbonated beverages or energy drinks. Condiments Hot sauce. Barbecue sauce.  Sweets/Desserts Chocolate and cocoa. Donuts. Peppermint and spearmint. Fats and Oils High-fat foods, including [place] fries and potato chips. Other Vinegar. Strong spices, such as black pepper, white pepper, red pepper, cayenne, curry powder, cloves, ginger, and chili powder.

## 2018-12-17 LAB — COMPLETE METABOLIC PANEL WITH GFR
AG Ratio: 1.6 (calc) (ref 1.0–2.5)
ALT: 23 U/L (ref 6–29)
AST: 27 U/L (ref 10–35)
Albumin: 4.4 g/dL (ref 3.6–5.1)
Alkaline phosphatase (APISO): 71 U/L (ref 31–125)
BUN: 16 mg/dL (ref 7–25)
CO2: 27 mmol/L (ref 20–32)
Calcium: 9.4 mg/dL (ref 8.6–10.2)
Chloride: 105 mmol/L (ref 98–110)
Creat: 0.87 mg/dL (ref 0.50–1.10)
GFR, Est African American: 93 mL/min/{1.73_m2} (ref >=60)
GFR, Est Non African American: 80 mL/min/{1.73_m2} (ref >=60)
Globulin: 2.8 g/dL (calc) (ref 1.9–3.7)
Glucose, Bld: 91 mg/dL (ref 65–99)
Potassium: 4.5 mmol/L (ref 3.5–5.3)
Sodium: 140 mmol/L (ref 135–146)
Total Bilirubin: 0.4 mg/dL (ref 0.2–1.2)
Total Protein: 7.2 g/dL (ref 6.1–8.1)

## 2018-12-17 LAB — CBC WITH DIFFERENTIAL/PLATELET
Absolute Monocytes: 529 cells/uL (ref 200–950)
Basophils Absolute: 59 cells/uL (ref 0–200)
Basophils Relative: 1.1 %
Eosinophils Absolute: 140 cells/uL (ref 15–500)
Eosinophils Relative: 2.6 %
HCT: 43.1 % (ref 35.0–45.0)
Hemoglobin: 14.3 g/dL (ref 11.7–15.5)
Lymphs Abs: 1588 cells/uL (ref 850–3900)
MCH: 30.2 pg (ref 27.0–33.0)
MCHC: 33.2 g/dL (ref 32.0–36.0)
MCV: 91.1 fL (ref 80.0–100.0)
MPV: 11.3 fL (ref 7.5–12.5)
Monocytes Relative: 9.8 %
Neutro Abs: 3083 cells/uL (ref 1500–7800)
Neutrophils Relative %: 57.1 %
Platelets: 315 10*3/uL (ref 140–400)
RBC: 4.73 10*6/uL (ref 3.80–5.10)
RDW: 12.4 % (ref 11.0–15.0)
Total Lymphocyte: 29.4 %
WBC: 5.4 10*3/uL (ref 3.8–10.8)

## 2018-12-17 LAB — LIPID PANEL
Cholesterol: 208 mg/dL — ABNORMAL HIGH (ref <200)
HDL: 71 mg/dL (ref >=50)
LDL Cholesterol (Calc): 120 mg/dL (calc) — ABNORMAL HIGH
Non-HDL Cholesterol (Calc): 137 mg/dL (calc) — ABNORMAL HIGH (ref <130)
Total CHOL/HDL Ratio: 2.9 (calc) (ref <5.0)
Triglycerides: 71 mg/dL (ref <150)

## 2018-12-17 LAB — URINALYSIS, ROUTINE W REFLEX MICROSCOPIC
Bilirubin Urine: NEGATIVE
Glucose, UA: NEGATIVE
Hgb urine dipstick: NEGATIVE
Ketones, ur: NEGATIVE
Leukocytes,Ua: NEGATIVE
Nitrite: NEGATIVE
Protein, ur: NEGATIVE
Specific Gravity, Urine: 1.012 (ref 1.001–1.03)
pH: <=5 (ref 5.0–8.0)

## 2018-12-17 LAB — TSH: TSH: 1.43 mIU/L

## 2018-12-17 LAB — HIV ANTIBODY (ROUTINE TESTING W REFLEX): HIV 1&2 Ab, 4th Generation: NONREACTIVE

## 2018-12-17 LAB — VITAMIN B12: Vitamin B-12: 528 pg/mL (ref 200–1100)

## 2018-12-17 LAB — MAGNESIUM: Magnesium: 2.3 mg/dL (ref 1.5–2.5)

## 2018-12-17 LAB — HEMOGLOBIN A1C
Hgb A1c MFr Bld: 5.3 % of total Hgb (ref <5.7)
Mean Plasma Glucose: 105 (calc)
eAG (mmol/L): 5.8 (calc)

## 2018-12-17 LAB — VITAMIN D 25 HYDROXY (VIT D DEFICIENCY, FRACTURES): Vit D, 25-Hydroxy: 65 ng/mL (ref 30–100)

## 2019-01-04 ENCOUNTER — Other Ambulatory Visit: Payer: Self-pay | Admitting: Physician Assistant

## 2019-01-04 MED FILL — ESCITALOPRAM 20 MG TABLET: 20 | 90 days supply | Qty: 90 | Fill #0

## 2019-01-12 ENCOUNTER — Other Ambulatory Visit: Payer: Self-pay | Admitting: Adult Health

## 2019-01-12 MED ORDER — BENZONATATE 200 MG PO CAPS: ORAL_CAPSULE | ORAL | 1 refills | Status: DC

## 2019-01-12 MED FILL — BENZONATATE 200 MG CAPSULE: 200 | 20 days supply | Qty: 60 | Fill #0

## 2019-01-15 ENCOUNTER — Telehealth: Payer: 59 | Admitting: Family

## 2019-01-15 DIAGNOSIS — R05 Cough: Secondary | ICD-10-CM

## 2019-01-15 DIAGNOSIS — R059 Cough, unspecified: Secondary | ICD-10-CM

## 2019-01-15 MED ORDER — ALBUTEROL SULFATE HFA 108 (90 BASE) MCG/ACT IN AERS: 2.0000 | INHALATION_SPRAY | Freq: Four times a day (QID) | RESPIRATORY_TRACT | 0 refills | Status: DC | PRN

## 2019-01-15 MED ORDER — PREDNISONE 10 MG (21) PO TBPK: ORAL_TABLET | ORAL | 0 refills | Status: DC

## 2019-01-15 NOTE — Addendum Note (Signed)
Addended by: Dutch Quint B on: 01/15/2019 09:45 AM   Modules accepted: Orders

## 2019-01-15 NOTE — Progress Notes (Signed)
We are sorry that you are not feeling well.  Here is how we plan to help!  Based on your presentation I believe you most likely have A cough due to a virus.  This is called viral bronchitis and is best treated by rest, plenty of fluids and control of the cough.  You may use Ibuprofen or Tylenol as directed to help your symptoms.     In addition you may use A non-prescription cough medication called Robitussin DAC. Take 2 teaspoons every 8 hours or Delsym: take 2 teaspoons every 12 hours.  Prednisone 10 mg daily for 6 days (see taper instructions below)  Directions for 6 day taper: Day 1: 2 tablets before breakfast, 1 after both lunch & dinner and 2 at bedtime Day 2: 1 tab before breakfast, 1 after both lunch & dinner and 2 at bedtime Day 3: 1 tab at each meal & 1 at bedtime Day 4: 1 tab at breakfast, 1 at lunch, 1 at bedtime Day 5: 1 tab at breakfast & 1 tab at bedtime Day 6: 1 tab at breakfast   From your responses in the eVisit questionnaire you describe inflammation in the upper respiratory tract which is causing a significant cough.  This is commonly called Bronchitis and has four common causes:    Allergies  Viral Infections  Acid Reflux  Bacterial Infection Allergies, viruses and acid reflux are treated by controlling symptoms or eliminating the cause. An example might be a cough caused by taking certain blood pressure medications. You stop the cough by changing the medication. Another example might be a cough caused by acid reflux. Controlling the reflux helps control the cough.  USE OF BRONCHODILATOR ("RESCUE") INHALERS: There is a risk from using your bronchodilator too frequently.  The risk is that over-reliance on a medication which only relaxes the muscles surrounding the breathing tubes can reduce the effectiveness of medications prescribed to reduce swelling and congestion of the tubes themselves.  Although you feel brief relief from the bronchodilator inhaler, your asthma  may actually be worsening with the tubes becoming more swollen and filled with mucus.  This can delay other crucial treatments, such as oral steroid medications. If you need to use a bronchodilator inhaler daily, several times per day, you should discuss this with your provider.  There are probably better treatments that could be used to keep your asthma under control.     HOME CARE . Only take medications as instructed by your medical team. . Complete the entire course of an antibiotic. . Drink plenty of fluids and get plenty of rest. . Avoid close contacts especially the very young and the elderly . Cover your mouth if you cough or cough into your sleeve. . Always remember to wash your hands . A steam or ultrasonic humidifier can help congestion.   GET HELP RIGHT AWAY IF: . You develop worsening fever. . You become short of breath . You cough up blood. . Your symptoms persist after you have completed your treatment plan MAKE SURE YOU   Understand these instructions.  Will watch your condition.  Will get help right away if you are not doing well or get worse.  Your e-visit answers were reviewed by a board certified advanced clinical practitioner to complete your personal care plan.  Depending on the condition, your plan could have included both over the counter or prescription medications. If there is a problem please reply  once you have received a response from your provider. Your   safety is important to us.  If you have drug allergies check your prescription carefully.    You can use MyChart to ask questions about today's visit, request a non-urgent call back, or ask for a work or school excuse for 24 hours related to this e-Visit. If it has been greater than 24 hours you will need to follow up with your provider, or enter a new e-Visit to address those concerns. You will get an e-mail in the next two days asking about your experience.  I hope that your e-visit has been valuable and  will speed your recovery. Thank you for using e-visits. Greater than 5 minutes, yet less than 10 minutes of time have been spent researching, coordinating, and implementing care for this patient today.  Thank you for the details you included in the comment boxes. Those details are very helpful in determining the best course of treatment for you and help us to provide the best care.  

## 2019-01-20 ENCOUNTER — Other Ambulatory Visit: Payer: Self-pay | Admitting: Adult Health

## 2019-01-20 MED ORDER — AZITHROMYCIN 250 MG PO TABS: ORAL_TABLET | ORAL | 1 refills | Status: DC

## 2019-01-20 MED FILL — AZITHROMYCIN 250 MG TABLET: 250 | 5 days supply | Qty: 6 | Fill #0

## 2019-01-25 ENCOUNTER — Ambulatory Visit
Admission: RE | Admit: 2019-01-25 | Discharge: 2019-01-25 | Disposition: A | Discharge status: Home / Self Care | Payer: 59 | Source: Ambulatory Visit | Attending: Adult Health | Admitting: Adult Health

## 2019-01-25 ENCOUNTER — Other Ambulatory Visit: Payer: Self-pay

## 2019-01-25 ENCOUNTER — Encounter: Payer: Self-pay | Admitting: Adult Health

## 2019-01-25 ENCOUNTER — Ambulatory Visit: Payer: 59 | Admitting: Adult Health

## 2019-01-25 VITALS — BP 140/98 | HR 63 | Temp 97.8°F | Wt 218.0 lb

## 2019-01-25 DIAGNOSIS — R05 Cough: Secondary | ICD-10-CM | POA: Diagnosis not present

## 2019-01-25 DIAGNOSIS — R5381 Other malaise: Secondary | ICD-10-CM

## 2019-01-25 DIAGNOSIS — R058 Other specified cough: Secondary | ICD-10-CM

## 2019-01-25 DIAGNOSIS — R0609 Other forms of dyspnea: Secondary | ICD-10-CM | POA: Diagnosis not present

## 2019-01-25 DIAGNOSIS — Z79899 Other long term (current) drug therapy: Secondary | ICD-10-CM

## 2019-01-25 DIAGNOSIS — R6889 Other general symptoms and signs: Secondary | ICD-10-CM | POA: Diagnosis not present

## 2019-01-25 DIAGNOSIS — R5383 Other fatigue: Secondary | ICD-10-CM

## 2019-01-25 DIAGNOSIS — Z20822 Contact with and (suspected) exposure to covid-19: Secondary | ICD-10-CM

## 2019-01-25 NOTE — Progress Notes (Signed)
Assessment and Plan:  Geraldy was seen today for shortness of breath and cough.  Diagnoses and all orders for this visit:  Productive cough/ malaise and fatigue/ exertional dyspnea VS, Exam is unremarkable, history most suggestive of viral syndrome/bronchitis though persistent/progressive nature is concerning STOP smoking Will check basic labs Will obtain CXR to r/o pneumonia Had negative covid 19 test, though recommended she present for retesting, possible false negative  Continue cough medications, push hydration, immune support NO improvement with zpak, prednisone taper - hold off further treatment unless indicated by imaging/labs Given Symbicort sample to trial - call to report if this seems to be helping in 2-3 days  The patient was advised to call immediately if she has any concerning symptoms in the interval. The patient voices understanding of current treatment options and is in agreement with the current care plan.The patient knows to call the clinic with any problems, questions or concerns or go to the ER if any further progression of symptoms.  -     CBC with Differential/Platelet -     DG Chest 2 View; Future -     COMPLETE METABOLIC PANEL WITH GFR  Further disposition pending results of labs. Discussed med's effects and SE's.   Over 15 minutes of exam, counseling, chart review, and critical decision making was performed.   Future Appointments  Date Time Provider [place]  06/21/2019 10:45 AM Liane Comber, NP [facility] None  12/21/2019 10:00 AM Liane Comber, NP [facility] None    ------------------------------------------------------------------------------------------------------------------   HPI BP (!) 140/98   Pulse 63   Temp 97.8 F (36.6 C)   Wt 218 lb (98.9 kg)   SpO2 97%   BMI 33.15 kg/m   48 y.o.female Current some day smoker, estimated 15 pack/year history, presents for evaluation of a persistent progressive cough.  She works as an Therapist, sports in a  cardiology office.   She reports she traveled to [place] to visit parents mid August (10-14th), after returning she noted fatigue, vaguely feeling unwell, reports later that week had headache, was concerned about covid 65, presented for testing on August 17th which was negative. She then started coughing, initially dry hacky, now progressively productive, alternates between dry and wet cough (thick secretions). She called and has been prescribed steroid taper and zpak, benzonatate, promethazine/DM, reports cough medications do help but steroid and zpak hasn't notably improved her symptoms. She reports fatigue/malaise, generally just feeling quite unwell. She endorses some chest tightness with coughing fits, does report some mild exertional dyspnea and also with coughing fits. Denies sore throat, has reported intermittent sinus pressure sensation though not currently. She speaks in complete sentences,  occasional hacking cough during visit. Reports cough worse at night when lies down.   She reports frontal headache is intermittent, currently not having any pain, has taken tylenol which does help significantly. Denies neck pain/rigidity, blurry vision, dizziness, nausea; she denies arthralgia/myalgias.  She has been monitoring temp, has intermittently felt feverish, has only been up to 98.8, etc.   She does report mild intermittent diarrhea throughout this time, but denies other GI sx (abd pain, nausea/vomiting).   She has been taking anoro sample 1 puff daily, doesn't feel this has improved symptoms. She does use albuterol after coughing fit which questionably helps though apparently note dramatically.   She lives with her fiance, he had possible exposure late August but then tested negative.    Past Medical History:  Diagnosis Date  . Anxiety   . Breast hypertrophy 03/2014  .  GERD (gastroesophageal reflux disease)   . Hyperlipidemia   . Hypothyroidism   . Interstitial cystitis       Allergies  Allergen Reactions  . Amoxicillin Rash and Other (See Comments)    FEVER  . Bisoprolol Anxiety  . Metoprolol Tartrate Palpitations  . Verapamil Rash    Current Outpatient Medications on File Prior to Visit  Medication Sig  . albuterol (VENTOLIN HFA) 108 (90 Base) MCG/ACT inhaler Inhale 2 puffs into the lungs every 6 (six) hours as needed for wheezing or shortness of breath.  . ALPRAZolam (XANAX) 0.5 MG tablet TAKE 1 TABLET BY MOUTH 3 TIMES A DAY AS NEEDED FOR ANXIETY. TRY TO CUT BACK ON HOW MUCH TAKING, NEEDS TO LAST CLOSE TO 3 MONTHS  . benzonatate (TESSALON) 200 MG capsule Take 1 cap three times a day by mouth as needed cough.  . Cholecalciferol (VITAMIN D PO) Take 5,000 Units by mouth daily.  . cyclobenzaprine (FLEXERIL) 10 MG tablet Take 1 tablet (10 mg total) by mouth every 8 (eight) hours as needed for muscle spasms.  Marland Kitchen escitalopram (LEXAPRO) 20 MG tablet Take 1 tablet Daily for Mood  . famotidine (PEPCID) 20 MG tablet Take 1 tablet (20 mg total) by mouth 2 (two) times daily.  . fluticasone (FLONASE) 50 MCG/ACT nasal spray Place 2 sprays into both nostrils daily. (Patient taking differently: Place 2 sprays into both nostrils as needed. )  . levothyroxine (SYNTHROID, LEVOTHROID) 112 MCG tablet TAKE 1 TABLET BY MOUTH ONCE DAILY BEFORE BREAKFAST.  . metoprolol succinate (TOPROL-XL) 50 MG 24 hr tablet Take 1 tablet (50 mg total) by mouth daily. Take with or immediately following a meal.  . omeprazole (PRILOSEC) 20 MG capsule Take 1 capsule (20 mg total) by mouth as needed.  Marland Kitchen oxybutynin (DITROPAN-XL) 10 MG 24 hr tablet TAKE 1 TABLET BY MOUTH DAILY FOR BLADDER  . rosuvastatin (CRESTOR) 5 MG tablet Take 1 tablet (5 mg total) by mouth daily. (Patient taking differently: Take 5 mg by mouth once a week. )  . predniSONE (STERAPRED UNI-PAK 21 TAB) 10 MG (21) TBPK tablet As directed (Patient not taking: Reported on 01/25/2019)   No current facility-administered medications on file  prior to visit.     ROS: Review of Systems  Constitutional: Positive for malaise/fatigue. Negative for chills, diaphoresis, fever and weight loss.  HENT: Positive for sinus pain (intermittent). Negative for congestion, ear discharge, ear pain, hearing loss and sore throat.   Eyes: Negative for blurred vision, photophobia, discharge and redness.  Respiratory: Positive for cough, sputum production and shortness of breath (mild, exertional). Negative for hemoptysis, wheezing and stridor.   Cardiovascular: Negative for chest pain, palpitations, orthopnea, claudication, leg swelling and PND.  Gastrointestinal: Positive for diarrhea (mild intermittent, ). Negative for abdominal pain, blood in stool, heartburn, melena, nausea and vomiting.  Genitourinary: Negative.   Musculoskeletal: Negative for joint pain, myalgias and neck pain.  Skin: Negative for itching and rash.  Neurological: Positive for headaches. Negative for dizziness, tingling, sensory change, speech change and weakness.  Endo/Heme/Allergies: Positive for environmental allergies. Negative for polydipsia.     Physical Exam:  BP (!) 140/98   Pulse 63   Temp 97.8 F (36.6 C)   Wt 218 lb (98.9 kg)   SpO2 97%   BMI 33.15 kg/m   General Appearance: Well nourished, doesn't appear acutely ill, in no apparent distress. Eyes: PERRLA, EOMs, conjunctiva no swelling or erythema Sinuses: No Frontal/maxillary tenderness ENT/Mouth: Ext aud canals clear, TMs without erythema,  bulging. No erythema, swelling, or exudate on post pharynx.  Tonsils not swollen or erythematous. Hearing normal.  Neck: Supple Respiratory: Respiratory effort normal, BS equal bilaterally without rales, rhonchi, wheezing or stridor.  Cardio: RRR with no MRGs. Brisk peripheral pulses without edema.  Abdomen: Soft, + BS.  Non tender, no guarding, rebound, hernias, masses. Lymphatics: Non tender without lymphadenopathy.  Musculoskeletal: Full ROM, no deformity or  effusions, normal gait.  Skin: Warm, dry without rashes, lesions, ecchymosis.  Neuro: Cranial nerves intact. Normal muscle tone, no cerebellar symptoms.  Psych: Awake and oriented X 3, normal affect, Insight and Judgment appropriate.     [name], NP 12:40 PM [facility]

## 2019-01-25 NOTE — Patient Instructions (Signed)
Will get chest xray to rule out pneumonia - can walk in at [address] to get done  Then recommend repeat covid 19 testing [facility]      Acute Bronchitis, Adult  Acute bronchitis is sudden (acute) swelling of the air tubes (bronchi) in the lungs. Acute bronchitis causes these tubes to fill with mucus, which can make it hard to breathe. It can also cause coughing or wheezing. In adults, acute bronchitis usually goes away within 2 weeks. A cough caused by bronchitis may last up to 3 weeks. Smoking, allergies, and asthma can make the condition worse. Repeated episodes of bronchitis may cause further lung problems, such as chronic obstructive pulmonary disease (COPD). What are the causes? This condition can be caused by germs and by substances that irritate the lungs, including:  Cold and flu viruses. This condition is most often caused by the same virus that causes a cold.  Bacteria.  Exposure to tobacco smoke, dust, fumes, and air pollution. What increases the risk? This condition is more likely to develop in people who:  Have close contact with someone with acute bronchitis.  Are exposed to lung irritants, such as tobacco smoke, dust, fumes, and vapors.  Have a weak immune system.  Have a respiratory condition such as asthma. What are the signs or symptoms? Symptoms of this condition include:  A cough.  Coughing up clear, yellow, or green mucus.  Wheezing.  Chest congestion.  Shortness of breath.  A fever.  Body aches.  Chills.  A sore throat. How is this diagnosed? This condition is usually diagnosed with a physical exam. During the exam, your health care provider may order tests, such as chest X-rays, to rule out other conditions. He or she may also:  Test a sample of your mucus for bacterial infection.  Check the level of oxygen in your blood. This is done to check for pneumonia.  Do a chest X-ray or lung function testing to rule  out pneumonia and other conditions.  Perform blood tests. Your health care provider will also ask about your symptoms and medical history. How is this treated? Most cases of acute bronchitis clear up over time without treatment. Your health care provider may recommend:  Drinking more fluids. Drinking more makes your mucus thinner, which may make it easier to breathe.  Taking a medicine for a fever or cough.  Taking an antibiotic medicine.  Using an inhaler to help improve shortness of breath and to control a cough.  Using a cool mist vaporizer or humidifier to make it easier to breathe. Follow these instructions at home: Medicines  Take over-the-counter and prescription medicines only as told by your health care provider.  If you were prescribed an antibiotic, take it as told by your health care provider. Do not stop taking the antibiotic even if you start to feel better. General instructions   Get plenty of rest.  Drink enough fluids to keep your urine pale yellow.  Avoid smoking and secondhand smoke. Exposure to cigarette smoke or irritating chemicals will make bronchitis worse. If you smoke and you need help quitting, ask your health care provider. Quitting smoking will help your lungs heal faster.  Use an inhaler, cool mist vaporizer, or humidifier as told by your health care provider.  Keep all follow-up visits as told by your health care provider. This is important. How is this prevented? To lower your risk of getting this condition again:  Wash your hands often with  soap and water. If soap and water are not available, use hand sanitizer.  Avoid contact with people who have cold symptoms.  Try not to touch your hands to your mouth, nose, or eyes.  Make sure to get the flu shot every year. Contact a health care provider if:  Your symptoms do not improve in 2 weeks of treatment. Get help right away if:  You cough up blood.  You have chest pain.  You have  severe shortness of breath.  You become dehydrated.  You faint or keep feeling like you are going to faint.  You keep vomiting.  You have a severe headache.  Your fever or chills gets worse. This information is not intended to replace advice given to you by your health care provider. Make sure you discuss any questions you have with your health care provider. Document Released: 06/12/2004 Document Revised: 03/18/2018 Document Reviewed: 10/24/2015 Elsevier Patient Education  2020 [employer].

## 2019-01-26 LAB — CBC WITH DIFFERENTIAL/PLATELET
Absolute Monocytes: 618 cells/uL (ref 200–950)
Basophils Absolute: 29 cells/uL (ref 0–200)
Basophils Relative: 0.3 %
Eosinophils Absolute: 0 cells/uL — ABNORMAL LOW (ref 15–500)
Eosinophils Relative: 0 %
HCT: 43.5 % (ref 35.0–45.0)
Hemoglobin: 14.5 g/dL (ref 11.7–15.5)
Lymphs Abs: 1463 cells/uL (ref 850–3900)
MCH: 30.3 pg (ref 27.0–33.0)
MCHC: 33.3 g/dL (ref 32.0–36.0)
MCV: 90.8 fL (ref 80.0–100.0)
MPV: 11.5 fL (ref 7.5–12.5)
Monocytes Relative: 6.5 %
Neutro Abs: 7391 cells/uL (ref 1500–7800)
Neutrophils Relative %: 77.8 %
Platelets: 302 10*3/uL (ref 140–400)
RBC: 4.79 10*6/uL (ref 3.80–5.10)
RDW: 12.4 % (ref 11.0–15.0)
Total Lymphocyte: 15.4 %
WBC: 9.5 10*3/uL (ref 3.8–10.8)

## 2019-01-26 LAB — COMPLETE METABOLIC PANEL WITH GFR
AG Ratio: 1.3 (calc) (ref 1.0–2.5)
ALT: 59 U/L — ABNORMAL HIGH (ref 6–29)
AST: 29 U/L (ref 10–35)
Albumin: 4.3 g/dL (ref 3.6–5.1)
Alkaline phosphatase (APISO): 74 U/L (ref 31–125)
BUN: 18 mg/dL (ref 7–25)
CO2: 28 mmol/L (ref 20–32)
Calcium: 9.6 mg/dL (ref 8.6–10.2)
Chloride: 102 mmol/L (ref 98–110)
Creat: 0.83 mg/dL (ref 0.50–1.10)
GFR, Est African American: 99 mL/min/{1.73_m2} (ref >=60)
GFR, Est Non African American: 85 mL/min/{1.73_m2} (ref >=60)
Globulin: 3.2 g/dL (calc) (ref 1.9–3.7)
Glucose, Bld: 107 mg/dL — ABNORMAL HIGH (ref 65–99)
Potassium: 4.6 mmol/L (ref 3.5–5.3)
Sodium: 139 mmol/L (ref 135–146)
Total Bilirubin: 0.3 mg/dL (ref 0.2–1.2)
Total Protein: 7.5 g/dL (ref 6.1–8.1)

## 2019-01-27 LAB — NOVEL CORONAVIRUS, NAA: SARS-CoV-2, NAA: NOT DETECTED

## 2019-02-01 MED FILL — METOPROLOL SUCCINATE ER 50: 50 | 90 days supply | Qty: 90 | Fill #2

## 2019-02-01 MED FILL — OXYBUTYNIN CL ER 10 MG TAB: 10 | 90 days supply | Qty: 90 | Fill #1

## 2019-02-10 MED FILL — LEVOTHYROXINE 112 MCG TAB: 112 | 90 days supply | Qty: 90 | Fill #2

## 2019-04-07 ENCOUNTER — Other Ambulatory Visit: Payer: Self-pay | Admitting: Adult Health

## 2019-04-07 MED ORDER — CYCLOBENZAPRINE HCL 10 MG PO TABS: 10.0000 mg | ORAL_TABLET | Freq: Three times a day (TID) | ORAL | 1 refills | Status: DC | PRN

## 2019-04-07 MED FILL — CYCLOBENZAPRINE 10 MG TAB: 10 | 20 days supply | Qty: 60 | Fill #0

## 2019-04-07 MED FILL — ESCITALOPRAM 20 MG TABLET: 20 | 90 days supply | Qty: 90 | Fill #1

## 2019-05-17 MED FILL — LEVOTHYROXINE SODIUM 112 MC: 112 | 90 days supply | Qty: 90 | Fill #3

## 2019-05-23 ENCOUNTER — Other Ambulatory Visit: Payer: Self-pay | Admitting: Adult Health

## 2019-05-23 MED FILL — ALPRAZolam 0.5 MG TABS: 0.5 | 75 days supply | Qty: 90 | Fill #0

## 2019-05-23 MED FILL — METOPROLOL SUCCINATE ER 50: 50 | 90 days supply | Qty: 90 | Fill #3

## 2019-06-15 IMAGING — MG MM CLIP PLACEMENT
6 series · 6 of 14 positions shown · non-contrast
Comparison: Previous exam(s).

CLINICAL DATA: 45-year-old female status post stereotactic biopsy
for a left breast developing asymmetry.

EXAM:
DIAGNOSTIC LEFT MAMMOGRAM POST ULTRASOUND BIOPSY

[L CC]
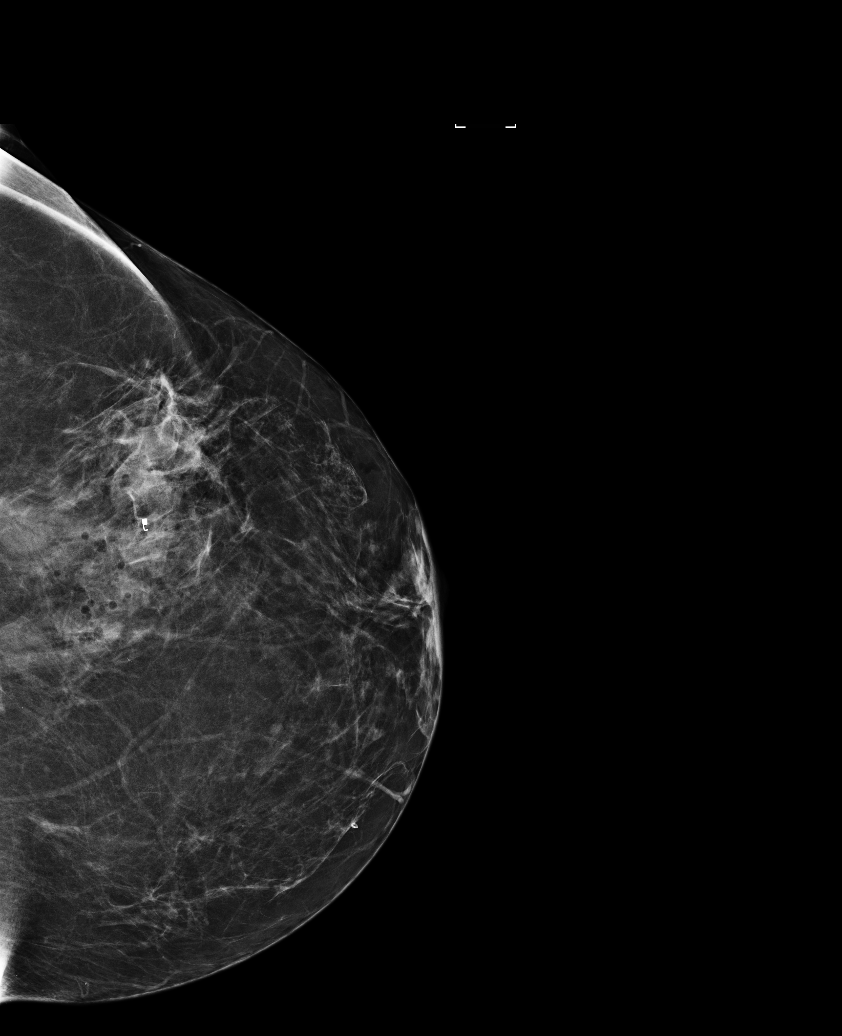

[L CC synth-2D]
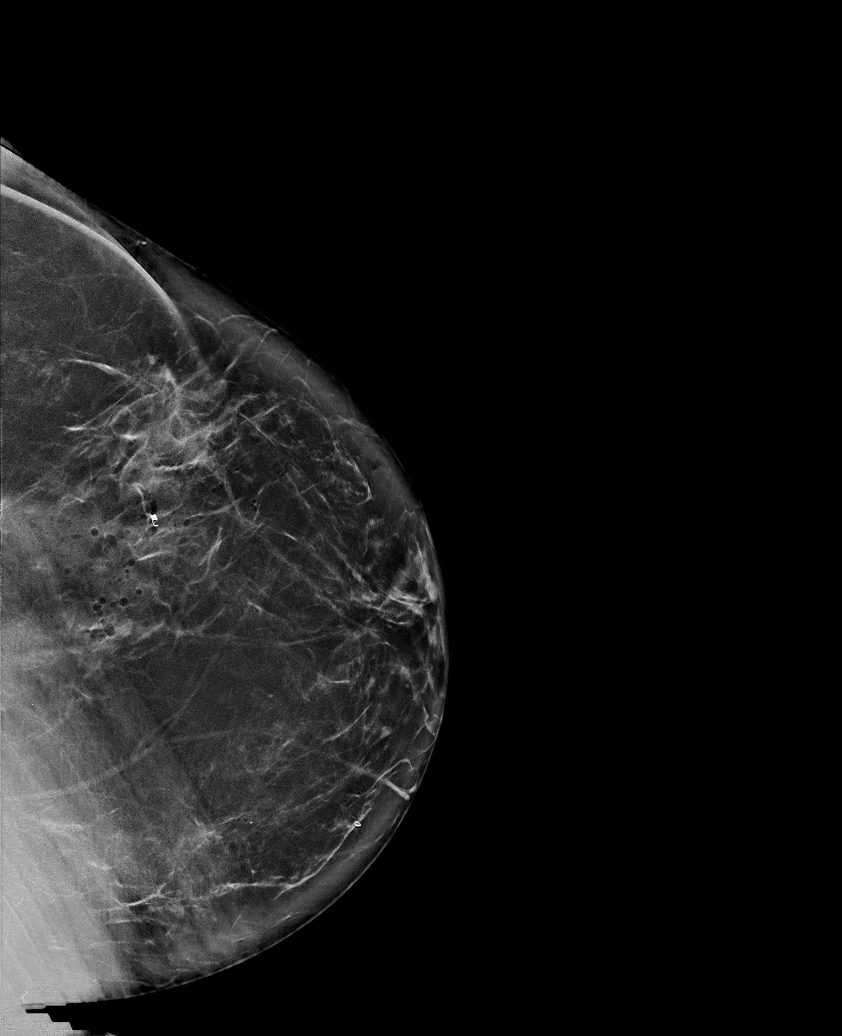

[L LM]
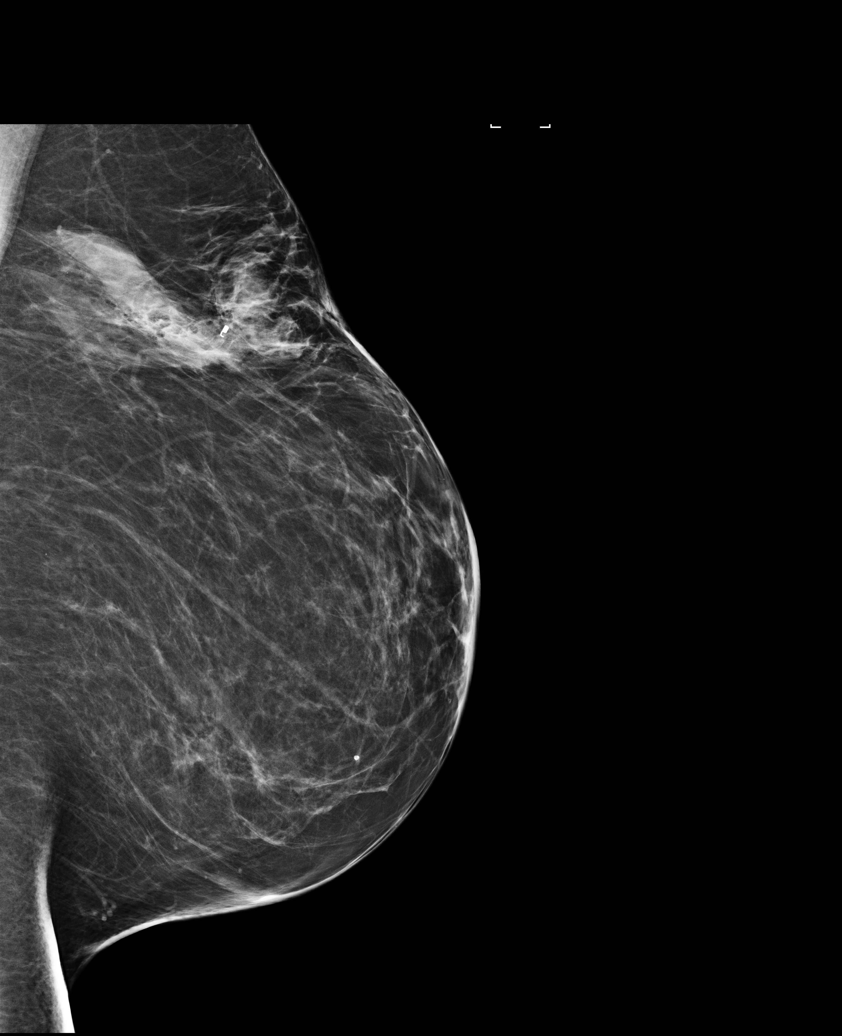

[L LM synth-2D]
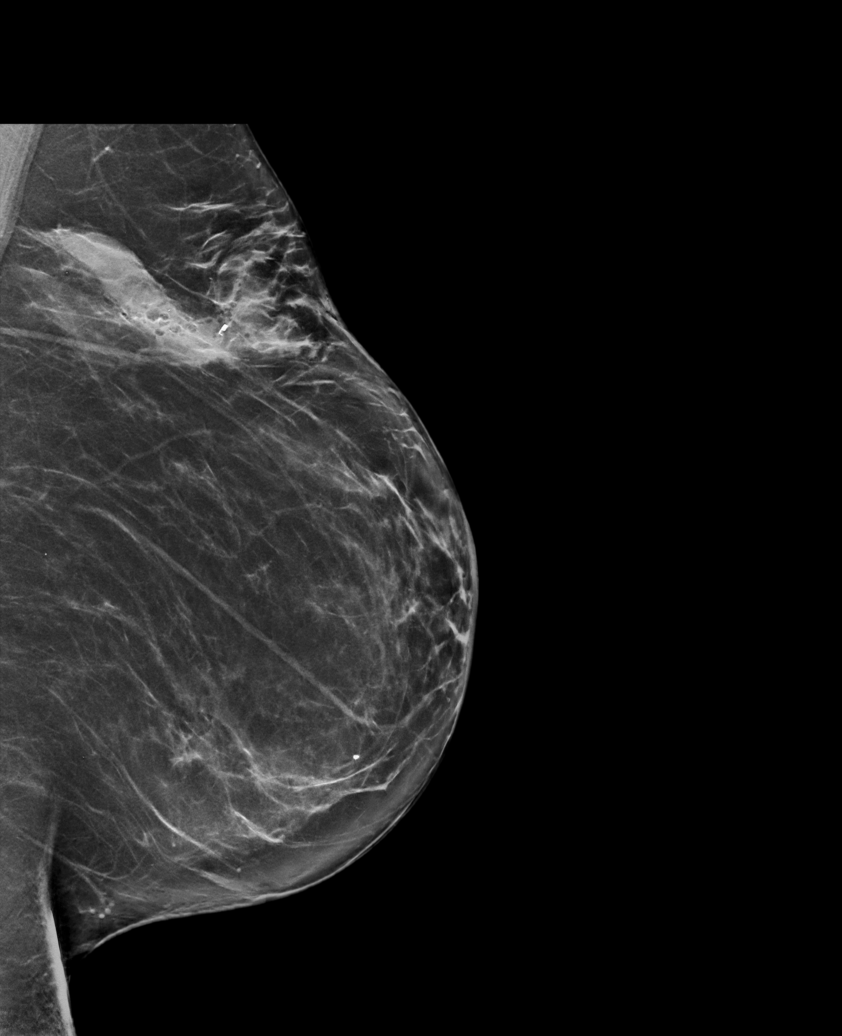

[L LM tomo · tomo slice 43/85.0]
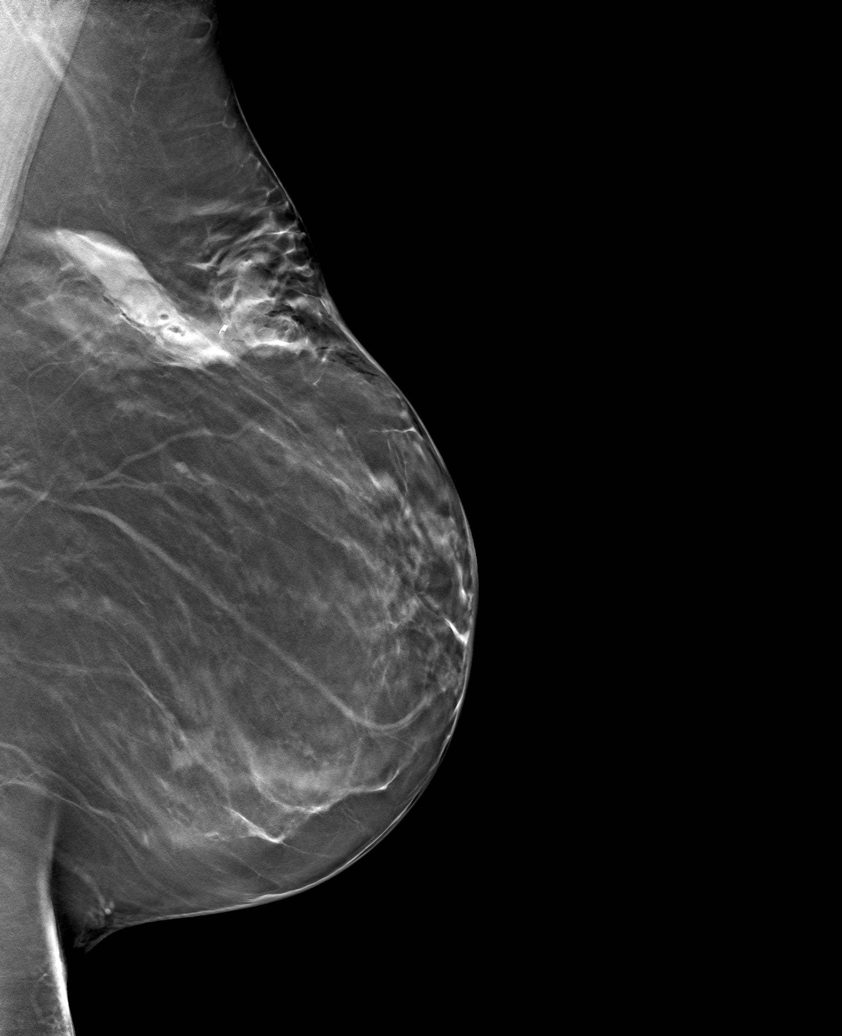

[L CC tomo · tomo slice 44/87.0]
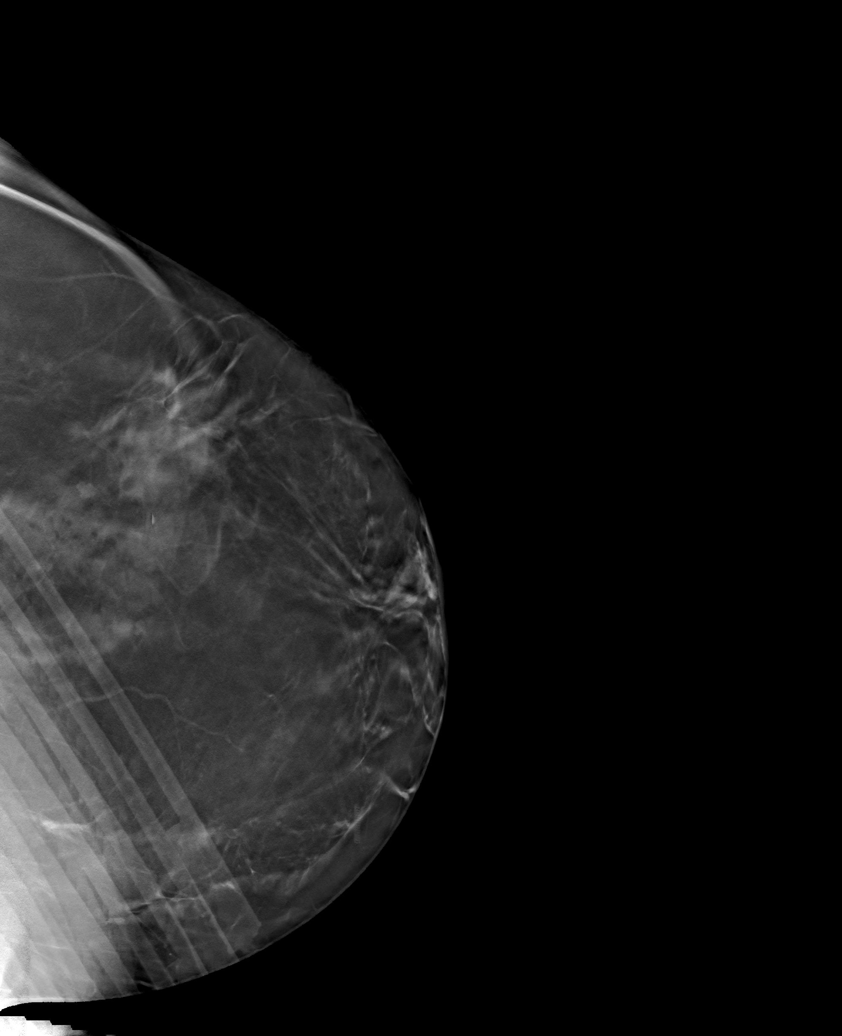

[6 of 14 positions shown; findings below may reference images not displayed]

FINDINGS: Mammographic images were obtained following stereotactic guided
biopsy of a left breast developing asymmetry. A coil shaped clip is
identified in the upper outer left breast at middle depth. This is
in the expected location status post stereotactic biopsy.
IMPRESSION: Post biopsy clip in the expected locations status post stereotactic
biopsy of a left breast developing asymmetry.

Final Assessment: Post Procedure Mammograms for Marker Placement

## 2019-06-20 ENCOUNTER — Ambulatory Visit: Payer: 59 | Admitting: Adult Health

## 2019-06-20 NOTE — Progress Notes (Signed)
FOLLOW UP  Assessment and Plan:   Tachycardia Continue to cut down on caffeine, ETOH, smoking Continue toprol; symptoms have improved/resolved  Gastroesophageal reflux disease with esophagitis Continue PPI/H2 blocker, diet discussed  Thyroid disease Hypothyroidism-check TSH level, continue medications the same, reminded to take on an empty stomach 30-30mins before food.  -     TSH  Interstitial cystitis Avoid triggers, doing well with oxybutynin  Vitamin D deficiency Near goal at recent check; continue to recommend supplementation for goal of 70-100 Defer vitamin D level  Generalized anxiety disorder/insomnia continue medications, stress management techniques discussed, increase water, good sleep hygiene discussed, increase exercise, and increase veggies.  Medication management -     CMP/GFR -     Magnesium  Hyperlipidemia Working on lifestyle modification, did not tolerate even low dose of crestor, will try low dose of lipitor Discussed with patient and she is in agreement Continue low cholesterol diet and exercise.  Check lipid panel.  -     Lipid panel  Obesity Long discussion about weight loss, diet, and exercise Recommended diet heavy in fruits and veggies and low in animal meats, cheeses, and dairy products, appropriate calorie intake Discussed appropriate weight for height and initial goal 210 Follow up at next visit  Continue diet and meds as discussed. Further disposition pending results of labs. Discussed med's effects and SE's.   Over 30 minutes of exam, counseling, chart review, and critical decision making was performed.   Future Appointments  Date Time Provider [place]  12/21/2019 10:00 AM Liane Comber, NP [facility] None    ----------------------------------------------------------------------------------------------------------------------  HPI 49 y.o. female  presents for 6 month follow up on anxiety/insomnia, tachycardia,  cholesterol, glucose management, weight and vitamin D deficiency.   She saw Dr. McDiarmid for ICS, is now on oxybutynin and reports nearly fully resolved symptoms on med.   She has been having left ear pain, ear throat.   She has had ongoing tachycardia/palpitations and was having dyspnea associated with this and was worked up by cardiology; Holter showed some PVCs/PACs, ECHO from 10/2017 was unremarkable. She has started to smoke again, has some chantix at home. She is on toprol XL with improvement in symptoms.   She has been prescribed xanax PRN for anxiety; she currently uses 0.25 mg at night if needed to sleep. She uses lexapro 20 mg daily.   BMI is Body mass index is 33.75 kg/m., she has not been working out or doing as well with diet due to the pandemic. She has had both vaccines.  Wt Readings from Last 3 Encounters:  06/21/19 222 lb (100.7 kg)  01/25/19 218 lb (98.9 kg)  12/16/18 217 lb 9.6 oz (98.7 kg)   Her blood pressure has been controlled at home, today their BP is BP: 126/72  She does workout. She denies chest pain, shortness of breath, dizziness.   She is not on cholesterol medication. Her cholesterol is not at goal. The cholesterol last visit was:   Lab Results  Component Value Date   CHOL 208 (H) 12/16/2018   HDL 71 12/16/2018   LDLCALC 120 (H) 12/16/2018   TRIG 71 12/16/2018   CHOLHDL 2.9 12/16/2018   Last A1C in the office was:  Lab Results  Component Value Date   HGBA1C 5.3 12/16/2018   She is on thyroid medication. Her medication was not changed last visit.   Lab Results  Component Value Date   TSH 1.43 12/16/2018   Patient is on Vitamin D supplement.  Lab Results  Component Value Date   VD25OH 39 12/16/2018       Current Medications:  Current Outpatient Medications on File Prior to Visit  Medication Sig  . ALPRAZolam (XANAX) 0.5 MG tablet TAKE 1 TABLET BY MOUTH 3 TIMES A DAY AS NEEDED FOR ANXIETY. TRY TO CUT BACK ON HOW MUCH TAKING, NEEDS TO LAST  CLOSE TO 3 MONTHS  . Cholecalciferol (VITAMIN D PO) Take 5,000 Units by mouth daily.  . cyclobenzaprine (FLEXERIL) 10 MG tablet Take 1 tablet (10 mg total) by mouth every 8 (eight) hours as needed for muscle spasms.  Marland Kitchen escitalopram (LEXAPRO) 20 MG tablet Take 1 tablet Daily for Mood  . famotidine (PEPCID) 20 MG tablet Take 1 tablet (20 mg total) by mouth 2 (two) times daily.  . fluticasone (FLONASE) 50 MCG/ACT nasal spray Place 2 sprays into both nostrils daily. (Patient taking differently: Place 2 sprays into both nostrils as needed. )  . levothyroxine (SYNTHROID, LEVOTHROID) 112 MCG tablet TAKE 1 TABLET BY MOUTH ONCE DAILY BEFORE BREAKFAST.  Marland Kitchen omeprazole (PRILOSEC) 20 MG capsule Take 1 capsule (20 mg total) by mouth as needed.  Marland Kitchen oxybutynin (DITROPAN-XL) 10 MG 24 hr tablet Take 1 tablet Daily for Bladder Control  . rosuvastatin (CRESTOR) 5 MG tablet Take 1 tablet (5 mg total) by mouth daily. (Patient taking differently: Take 5 mg by mouth once a week. )  . metoprolol succinate (TOPROL-XL) 50 MG 24 hr tablet Take 1 tablet (50 mg total) by mouth daily. Take with or immediately following a meal.   No current facility-administered medications on file prior to visit.     Allergies:  Allergies  Allergen Reactions  . Amoxicillin Rash and Other (See Comments)    FEVER  . Bisoprolol Anxiety  . Metoprolol Tartrate Palpitations  . Verapamil Rash     Medical History:  Past Medical History:  Diagnosis Date  . Anxiety   . Breast hypertrophy 03/2014  . GERD (gastroesophageal reflux disease)   . Hyperlipidemia   . Hypothyroidism   . Interstitial cystitis    Family history- Reviewed and unchanged Social history- Reviewed and unchanged   Review of Systems:  Review of Systems  Constitutional: Negative for malaise/fatigue and weight loss.  HENT: Negative for hearing loss and tinnitus.   Eyes: Negative for blurred vision and double vision.  Respiratory: Negative for cough, shortness of  breath and wheezing.   Cardiovascular: Negative for chest pain, palpitations, orthopnea, claudication and leg swelling.  Gastrointestinal: Negative for abdominal pain, blood in stool, constipation, diarrhea, heartburn, melena, nausea and vomiting.  Genitourinary: Negative.   Musculoskeletal: Negative for joint pain and myalgias.  Skin: Negative for rash.  Neurological: Negative for dizziness, tingling, sensory change, weakness and headaches.  Endo/Heme/Allergies: Negative for polydipsia.  Psychiatric/Behavioral: The patient is nervous/anxious and has insomnia.   All other systems reviewed and are negative.     Physical Exam: BP 126/72   Pulse 87   Temp 97.6 F (36.4 C)   Wt 222 lb (100.7 kg)   SpO2 96%   BMI 33.75 kg/m  Wt Readings from Last 3 Encounters:  06/21/19 222 lb (100.7 kg)  01/25/19 218 lb (98.9 kg)  12/16/18 217 lb 9.6 oz (98.7 kg)   General Appearance: Well nourished, in no apparent distress. Eyes: PERRLA, EOMs, conjunctiva no swelling or erythema Sinuses: No Frontal/maxillary tenderness ENT/Mouth: Ext aud canals clear, TMs without erythema, bulging. No erythema, swelling, or exudate on post pharynx.  Tonsils not swollen or erythematous. Hearing  normal.  Neck: Supple, thyroid normal.  Respiratory: Respiratory effort normal, BS equal bilaterally without rales, rhonchi, wheezing or stridor.  Cardio: RRR with no MRGs. Brisk peripheral pulses without edema.  Abdomen: Soft, + BS.  Non tender, no guarding, rebound, hernias, masses. Lymphatics: Non tender without lymphadenopathy.  Musculoskeletal: Full ROM, 5/5 strength, Normal gait Skin: Warm, dry without rashes, lesions, ecchymosis.  Neuro: Cranial nerves intact. No cerebellar symptoms.  Psych: Awake and oriented X 3, normal affect, Insight and Judgment appropriate.    Vicie Mutters, PA-C 10:53 AM [facility]

## 2019-06-21 ENCOUNTER — Encounter: Payer: Self-pay | Admitting: Physician Assistant

## 2019-06-21 ENCOUNTER — Encounter: Payer: Self-pay | Admitting: Internal Medicine

## 2019-06-21 ENCOUNTER — Ambulatory Visit: Payer: 59 | Admitting: Physician Assistant

## 2019-06-21 ENCOUNTER — Other Ambulatory Visit: Payer: Self-pay

## 2019-06-21 ENCOUNTER — Ambulatory Visit: Payer: 59 | Admitting: Adult Health

## 2019-06-21 VITALS — BP 126/72 | HR 87 | Temp 97.6°F | Wt 222.0 lb

## 2019-06-21 DIAGNOSIS — E782 Mixed hyperlipidemia: Secondary | ICD-10-CM | POA: Diagnosis not present

## 2019-06-21 DIAGNOSIS — R7309 Other abnormal glucose: Secondary | ICD-10-CM

## 2019-06-21 DIAGNOSIS — E079 Disorder of thyroid, unspecified: Secondary | ICD-10-CM | POA: Diagnosis not present

## 2019-06-21 DIAGNOSIS — F17200 Nicotine dependence, unspecified, uncomplicated: Secondary | ICD-10-CM

## 2019-06-21 DIAGNOSIS — R000 Tachycardia, unspecified: Secondary | ICD-10-CM

## 2019-06-21 DIAGNOSIS — F411 Generalized anxiety disorder: Secondary | ICD-10-CM | POA: Diagnosis not present

## 2019-06-21 DIAGNOSIS — Z79899 Other long term (current) drug therapy: Secondary | ICD-10-CM | POA: Diagnosis not present

## 2019-06-21 DIAGNOSIS — E669 Obesity, unspecified: Secondary | ICD-10-CM

## 2019-06-21 DIAGNOSIS — E559 Vitamin D deficiency, unspecified: Secondary | ICD-10-CM | POA: Diagnosis not present

## 2019-06-21 MED ORDER — ATORVASTATIN CALCIUM 20 MG PO TABS: ORAL_TABLET | ORAL | 1 refills | Status: DC

## 2019-06-21 MED FILL — ATORVASTATIN 20 MG TABLET: 20 | 84 days supply | Qty: 36 | Fill #0

## 2019-06-21 NOTE — Patient Instructions (Addendum)
Try pepcid and nasal spray if not better let me know and we will refer you to ENT  Your ears and sinuses are connected by the eustachian tube. When your sinuses are inflamed, this can close off the tube and cause fluid to collect in your middle ear. This can then cause dizziness, popping, clicking, ringing, and echoing in your ears. This is often NOT an infection and does NOT require antibiotics, it is caused by inflammation so the treatments help the inflammation. This can take a long time to get better so please be patient.  Here are things you can do to help with this: - Try the Flonase or Nasonex. Remember to spray each nostril twice towards the outer part of your eye.  Do not sniff but instead pinch your nose and tilt your head back to help the medicine get into your sinuses.  The best time to do this is at bedtime.Stop if you get blurred vision or nose bleeds.  -While drinking fluids, pinch and hold nose close and swallow, to help open eustachian tubes to drain fluid behind ear drums. -Please pick one of the over the counter allergy medications below and take it once daily for allergies.  It will also help with fluid behind ear drums. Claritin or loratadine cheapest but likely the weakest  Zyrtec or certizine at night because it can make you sleepy The strongest is allegra or fexafinadine  Cheapest at [employer], [employer], [employer] -can use decongestant over the counter, please do not use if you have high blood pressure or certain heart conditions.   Silent reflux: Not all heartburn burns...Marland KitchenMarland KitchenMarland Kitchen  What is LPR? Laryngopharyngeal reflux (LPR) or silent reflux is a condition in which acid that is made in the stomach travels up the esophagus (swallowing tube) and gets to the throat. Not everyone with reflux has a lot of heartburn or indigestion. In fact, many people with LPR never have heartburn. This is why LPR is called SILENT REFLUX, and the terms "Silent reflux" and "LPR" are often used interchangeably.  Because LPR is silent, it is sometimes difficult to diagnose.  How can you tell if you have LPR?  Marland Kitchen Chronic hoarseness- Some people have hoarseness that comes and goes . throat clearing  . Cough . It can cause shortness of breath and cause asthma like symptoms. Marland Kitchen a feeling of a lump in the throat  . difficulty swallowing . a problem with too much nose and throat drainage.  . Some people will feel their esophagus spasm which feels like their heart beating hard and fast, this will usually be after a meal, at rest, or lying down at night.    How do I treat this? Treatment for LPR should be individualized, and your doctor will suggest the best treatment for you. Generally there are several treatments for LPR: . changing habits and diet to reduce reflux,  . medications to reduce stomach acid, and  . surgery to prevent reflux. Most people with LPR need to modify how and when they eat, as well as take some medication, to get well. Sometimes, nonprescription liquid antacids, such as Maalox, Gelucil and Mylanta are recommended. When used, these antacids should be taken four times each day - one tablespoon one hour after each meal and before bedtime. Dietary and lifestyle changes alone are not often enough to control LPR - medications that reduce stomach acid are also usually needed. These must be prescribed by our doctor.   TIPS FOR REDUCING REFLUX AND LPR Control  your LIFE-STYLE and your DIET! Marland Kitchen If you use tobacco, QUIT.  Marland Kitchen Smoking makes you reflux. After every cigarette you have some LPR.  . Don't wear clothing that is too tight, especially around the waist (trousers, corsets, belts).  . Do not lie down just after eating...in fact, do not eat within three hours of bedtime.  . You should be on a low-fat diet.  . Limit your intake of red meat.  . Limit your intake of butter.  Marland Kitchen Avoid fried foods.  . Avoid chocolate  . Avoid cheese.  Marland Kitchen Avoid eggs. Marland Kitchen Specifically avoid caffeine (especially  coffee and tea), soda pop (especially cola) and mints.  . Avoid alcoholic beverages, particularly in the evening.  if worsening HA, changes vision/speech, imbalance, weakness go to the ER  General eating tips  What to Avoid . Avoid added sugars o Often added sugar can be found in processed foods such as many condiments, dry cereals, cakes, cookies, chips, crisps, crackers, candies, sweetened drinks, etc.  o Read labels and AVOID/DECREASE use of foods with the following in their ingredient list: Sugar, fructose, high fructose corn syrup, sucrose, glucose, maltose, dextrose, molasses, cane sugar, brown sugar, any type of syrup, agave nectar, etc.   . Avoid snacking in between meals- drink water or if you feel you need a snack, pick a high water content snack such as cucumbers, watermelon, or any veggie.  Marland Kitchen Avoid foods made with flour o If you are going to eat food made with flour, choose those made with whole-grains; and, minimize your consumption as much as is tolerable . Avoid processed foods o These foods are generally stocked in the middle of the grocery store.  o Focus on shopping on the perimeter of the grocery.  What to Include . Vegetables o GREEN LEAFY VEGETABLES: Kale, spinach, mustard greens, collard greens, cabbage, broccoli, etc. o OTHER: Asparagus, cauliflower, eggplant, carrots, peas, Brussel sprouts, tomatoes, bell peppers, zucchini, beets, cucumbers, etc. . Grains, seeds, and legumes o Beans: kidney beans, black eyed peas, garbanzo beans, black beans, pinto beans, etc. o Whole, unrefined grains: brown rice, barley, bulgur, oatmeal, etc. . Healthy fats  o Avoid highly processed fats such as vegetable oil o Examples of healthy fats: avocado, olives, virgin olive oil, dark chocolate (?72% Cocoa), nuts (peanuts, almonds, walnuts, cashews, pecans, etc.) o Please still do small amount of these healthy fats, they are dense in calories.  . Low - Moderate Intake of Animal Sources of  Protein o Meat sources: chicken, [place], salmon, tuna. Limit to 4 ounces of meat at one time or the size of your palm. o Consider limiting dairy sources, but when choosing dairy focus on: PLAIN [place] yogurt, cottage cheese, high-protein milk . Fruit o Choose berries

## 2019-06-22 LAB — COMPLETE METABOLIC PANEL WITH GFR
AG Ratio: 1.3 (calc) (ref 1.0–2.5)
ALT: 20 U/L (ref 6–29)
AST: 17 U/L (ref 10–35)
Albumin: 4.3 g/dL (ref 3.6–5.1)
Alkaline phosphatase (APISO): 76 U/L (ref 31–125)
BUN: 10 mg/dL (ref 7–25)
CO2: 27 mmol/L (ref 20–32)
Calcium: 9.6 mg/dL (ref 8.6–10.2)
Chloride: 103 mmol/L (ref 98–110)
Creat: 0.9 mg/dL (ref 0.50–1.10)
GFR, Est African American: 89 mL/min/{1.73_m2} (ref >=60)
GFR, Est Non African American: 77 mL/min/{1.73_m2} (ref >=60)
Globulin: 3.2 g/dL (calc) (ref 1.9–3.7)
Glucose, Bld: 92 mg/dL (ref 65–99)
Potassium: 4.2 mmol/L (ref 3.5–5.3)
Sodium: 140 mmol/L (ref 135–146)
Total Bilirubin: 0.5 mg/dL (ref 0.2–1.2)
Total Protein: 7.5 g/dL (ref 6.1–8.1)

## 2019-06-22 LAB — CBC WITH DIFFERENTIAL/PLATELET
Absolute Monocytes: 572 cells/uL (ref 200–950)
Basophils Absolute: 47 cells/uL (ref 0–200)
Basophils Relative: 0.8 %
Eosinophils Absolute: 148 cells/uL (ref 15–500)
Eosinophils Relative: 2.5 %
HCT: 42.2 % (ref 35.0–45.0)
Hemoglobin: 14.2 g/dL (ref 11.7–15.5)
Lymphs Abs: 1381 cells/uL (ref 850–3900)
MCH: 30.1 pg (ref 27.0–33.0)
MCHC: 33.6 g/dL (ref 32.0–36.0)
MCV: 89.6 fL (ref 80.0–100.0)
MPV: 11.7 fL (ref 7.5–12.5)
Monocytes Relative: 9.7 %
Neutro Abs: 3752 cells/uL (ref 1500–7800)
Neutrophils Relative %: 63.6 %
Platelets: 276 10*3/uL (ref 140–400)
RBC: 4.71 10*6/uL (ref 3.80–5.10)
RDW: 11.8 % (ref 11.0–15.0)
Total Lymphocyte: 23.4 %
WBC: 5.9 10*3/uL (ref 3.8–10.8)

## 2019-06-22 LAB — LIPID PANEL
Cholesterol: 216 mg/dL — ABNORMAL HIGH (ref <200)
HDL: 66 mg/dL (ref >=50)
LDL Cholesterol (Calc): 123 mg/dL (calc) — ABNORMAL HIGH
Non-HDL Cholesterol (Calc): 150 mg/dL (calc) — ABNORMAL HIGH (ref <130)
Total CHOL/HDL Ratio: 3.3 (calc) (ref <5.0)
Triglycerides: 158 mg/dL — ABNORMAL HIGH (ref <150)

## 2019-06-22 LAB — MAGNESIUM: Magnesium: 2.3 mg/dL (ref 1.5–2.5)

## 2019-06-22 LAB — HEMOGLOBIN A1C
Hgb A1c MFr Bld: 5.4 % of total Hgb (ref <5.7)
Mean Plasma Glucose: 108 (calc)
eAG (mmol/L): 6 (calc)

## 2019-06-22 LAB — VITAMIN D 25 HYDROXY (VIT D DEFICIENCY, FRACTURES): Vit D, 25-Hydroxy: 48 ng/mL (ref 30–100)

## 2019-06-22 LAB — TSH: TSH: 2.05 mIU/L

## 2019-07-08 ENCOUNTER — Other Ambulatory Visit: Payer: Self-pay | Admitting: Internal Medicine

## 2019-07-08 MED FILL — ESCITALOPRAM 20 MG TABLET: 20 | 90 days supply | Qty: 90 | Fill #0

## 2019-07-08 MED FILL — OXYBUTYNIN CL ER 10 MG TAB: 10 | 90 days supply | Qty: 90 | Fill #1

## 2019-07-08 MED FILL — ATORVASTATIN 20 MG TABLET: 20 | 84 days supply | Qty: 36 | Fill #0

## 2019-07-12 MED ORDER — LORAZEPAM 0.5 MG TABLET
0.5 | ORAL_TABLET | Freq: Every day | ORAL | 1 refills | 15.00000 days | Status: AC | PRN
Start: 2019-07-12 — End: 2020-11-14

## 2019-08-03 NOTE — Progress Notes (Signed)
Virtual Visit via Video Note changed to phone visit at patient request   This visit type was conducted due to national recommendations for restrictions regarding the COVID-19 Pandemic (e.g. social distancing) in an effort to limit this patient's exposure and mitigate transmission in our community.  Due to her co-morbid illnesses, this patient is at least at moderate risk for complications without adequate follow up.  This format is felt to be most appropriate for this patient at this time.  All issues noted in this document were discussed and addressed.  A limited physical exam was performed with this format.  Please refer to the patient's chart for her consent to telehealth for [facility].   Date:  08/16/2019   ID:  Shari Payne, DOB 01/09/1971, MRN [id number]  Patient Location:Home Provider Location: Home  PCP:  Unk Pinto, MD  Cardiologist:  Dr Stanford Breed  Evaluation Performed:  Follow-Up Visit  Chief Complaint:  FU dyspnea and palpitations  History of Present Illness:    FU dyspnea and palpitations.  Holter monitor January 2019 showed sinus rhythm with occasional PACs, brief PAT and rare PVC. Echo June 2019 showed normal LV function.  Since last seen she denies dyspnea or chest pain; occasional palpitations, worse with alcohol.  The patient does not have symptoms concerning for COVID-19 infection (fever, chills, cough, or new shortness of breath).    Past Medical History:  Diagnosis Date  . Anxiety   . Breast hypertrophy 03/2014  . GERD (gastroesophageal reflux disease)   . Hyperlipidemia   . Hypothyroidism   . Interstitial cystitis    Past Surgical History:  Procedure Laterality Date  . BREAST BIOPSY Left   . BREAST REDUCTION SURGERY Bilateral 04/18/2014   Procedure: MAMMARY REDUCTION  (BREAST) BILATERAL;  Surgeon: Charlene Brooke, MD;  Location: [facility];  Service: Plastics;  Laterality: Bilateral;     Current Meds  Medication Sig  .  ALPRAZolam (XANAX) 0.5 MG tablet TAKE 1 TABLET BY MOUTH 3 TIMES A DAY AS NEEDED FOR ANXIETY. TRY TO CUT BACK ON HOW MUCH TAKING, NEEDS TO LAST CLOSE TO 3 MONTHS  . budesonide-formoterol (SYMBICORT) 80-4.5 MCG/ACT inhaler Inhale 2 puffs daily twice a day, wash your mouth afterwards  . Cholecalciferol (VITAMIN D PO) Take 5,000 Units by mouth daily.  . cyclobenzaprine (FLEXERIL) 10 MG tablet Take 1 tablet (10 mg total) by mouth every 8 (eight) hours as needed for muscle spasms.  Marland Kitchen escitalopram (LEXAPRO) 20 MG tablet TAKE 1 TABLET BY MOUTH ONCE A DAY FOR MOOD  . famotidine (PEPCID) 20 MG tablet Take 1 tablet (20 mg total) by mouth 2 (two) times daily. (Patient taking differently: Take 20 mg by mouth at bedtime. )  . fluticasone (FLONASE) 50 MCG/ACT nasal spray Place 2 sprays into both nostrils daily. (Patient taking differently: Place 2 sprays into both nostrils as needed. )  . levothyroxine (SYNTHROID, LEVOTHROID) 112 MCG tablet TAKE 1 TABLET BY MOUTH ONCE DAILY BEFORE BREAKFAST.  . metoprolol succinate (TOPROL-XL) 50 MG 24 hr tablet Take 1 tablet (50 mg total) by mouth daily. Take with or immediately following a meal.  . omeprazole (PRILOSEC) 20 MG capsule Take 1 capsule (20 mg total) by mouth as needed.  Marland Kitchen oxybutynin (DITROPAN-XL) 10 MG 24 hr tablet Take 1 tablet Daily for Bladder Control     Allergies:   Amoxicillin, Bisoprolol, Metoprolol tartrate, and Verapamil   Social History   Tobacco Use  . Smoking status: Current Some Day Smoker  Packs/day: 0.50    Years: 30.00    Pack years: 15.00    Types: Cigarettes  . Smokeless tobacco: Never Used  . Tobacco comment: currently down to 0.25 pack/day or so  Substance Use Topics  . Alcohol use: Yes    Alcohol/week: 10.0 standard drinks    Types: 10 Standard drinks or equivalent per week    Comment: weekends  . Drug use: No     Family Hx: The patient's family history includes CVA (age of onset: 33) in her maternal grandfather; Heart failure  in her paternal grandfather; Osteosarcoma (age of onset: 78) in her sister. There is no history of Breast cancer.  ROS:   Please see the history of present illness.    No Fever, chills  or productive cough All other systems reviewed and are negative.   Recent Labs: 06/21/2019: ALT 20; BUN 10; Creat 0.90; Hemoglobin 14.2; Magnesium 2.3; Platelets 276; Potassium 4.2; Sodium 140; TSH 2.05   Recent Lipid Panel Lab Results  Component Value Date/Time   CHOL 216 (H) 06/21/2019 11:18 AM   TRIG 158 (H) 06/21/2019 11:18 AM   HDL 66 06/21/2019 11:18 AM   CHOLHDL 3.3 06/21/2019 11:18 AM   LDLCALC 123 (H) 06/21/2019 11:18 AM    Wt Readings from Last 3 Encounters:  08/16/19 220 lb (99.8 kg)  06/21/19 222 lb (100.7 kg)  01/25/19 218 lb (98.9 kg)     Objective:    Vital Signs:  Ht 5\' 8"  (1.727 m)   Wt 220 lb (99.8 kg)   BMI 33.45 kg/m    VITAL SIGNS:  reviewed NAD Answers questions appropriately Normal affect Remainder of physical examination not performed (telehealth visit; coronavirus pandemic)  ASSESSMENT & PLAN:    1. Palpitations-patient symptoms are reasonably well controlled.  Continue beta-blocker at present dose.  LV function is normal. 2. Dyspnea-previous echocardiogram showed normal LV function. 3. Tobacco abuse-patient counseled on discontinuing.  COVID-19 Education: The importance of social distancing was discussed today.  Time:   Today, I have spent 13 minutes with the patient with telehealth technology discussing the above problems.     Medication Adjustments/Labs and Tests Ordered: Current medicines are reviewed at length with the patient today.  Concerns regarding medicines are outlined above.   Tests Ordered: No orders of the defined types were placed in this encounter.   Medication Changes: No orders of the defined types were placed in this encounter.   Follow Up:  Either In Person or Virtual in 1 year(s)  Signed, Kirk Ruths, MD  08/16/2019 9:24  AM    [place]

## 2019-08-05 ENCOUNTER — Telehealth: Payer: 59 | Admitting: Nurse Practitioner

## 2019-08-05 DIAGNOSIS — R05 Cough: Secondary | ICD-10-CM | POA: Diagnosis not present

## 2019-08-05 DIAGNOSIS — R059 Cough, unspecified: Secondary | ICD-10-CM

## 2019-08-05 MED ORDER — PREDNISONE 10 MG (21) PO TBPK: ORAL_TABLET | ORAL | 0 refills | Status: DC

## 2019-08-05 MED ORDER — BENZONATATE 100 MG PO CAPS: 100.0000 mg | ORAL_CAPSULE | Freq: Three times a day (TID) | ORAL | 0 refills | Status: DC | PRN

## 2019-08-05 NOTE — Progress Notes (Signed)
We are sorry that you are not feeling well.  Here is how we plan to help!  Based on your presentation I believe you most likely have A cough due to a virus.  This is called viral bronchitis and is best treated by rest, plenty of fluids and control of the cough.  You may use Ibuprofen or Tylenol as directed to help your symptoms.     In addition you may use A prescription cough medication called Tessalon Perles 100mg . You may take 1-2 capsules every 8 hours as needed for your cough.  Prednisone 10 mg daily for 6 days (see taper instructions below)  Directions for 6 day taper: Day 1: 2 tablets before breakfast, 1 after both lunch & dinner and 2 at bedtime Day 2: 1 tab before breakfast, 1 after both lunch & dinner and 2 at bedtime Day 3: 1 tab at each meal & 1 at bedtime Day 4: 1 tab at breakfast, 1 at lunch, 1 at bedtime Day 5: 1 tab at breakfast & 1 tab at bedtime Day 6: 1 tab at breakfast  * you will need to see your PCP is you think you need a maintenance inhaler like symbicort.  From your responses in the eVisit questionnaire you describe inflammation in the upper respiratory tract which is causing a significant cough.  This is commonly called Bronchitis and has four common causes:    Allergies  Viral Infections  Acid Reflux  Bacterial Infection Allergies, viruses and acid reflux are treated by controlling symptoms or eliminating the cause. An example might be a cough caused by taking certain blood pressure medications. You stop the cough by changing the medication. Another example might be a cough caused by acid reflux. Controlling the reflux helps control the cough.  USE OF BRONCHODILATOR ("RESCUE") INHALERS: There is a risk from using your bronchodilator too frequently.  The risk is that over-reliance on a medication which only relaxes the muscles surrounding the breathing tubes can reduce the effectiveness of medications prescribed to reduce swelling and congestion of the tubes  themselves.  Although you feel brief relief from the bronchodilator inhaler, your asthma may actually be worsening with the tubes becoming more swollen and filled with mucus.  This can delay other crucial treatments, such as oral steroid medications. If you need to use a bronchodilator inhaler daily, several times per day, you should discuss this with your provider.  There are probably better treatments that could be used to keep your asthma under control.     HOME CARE . Only take medications as instructed by your medical team. . Complete the entire course of an antibiotic. . Drink plenty of fluids and get plenty of rest. . Avoid close contacts especially the very young and the elderly . Cover your mouth if you cough or cough into your sleeve. . Always remember to wash your hands . A steam or ultrasonic humidifier can help congestion.   GET HELP RIGHT AWAY IF: . You develop worsening fever. . You become short of breath . You cough up blood. . Your symptoms persist after you have completed your treatment plan MAKE SURE YOU   Understand these instructions.  Will watch your condition.  Will get help right away if you are not doing well or get worse.  Your e-visit answers were reviewed by a board certified advanced clinical practitioner to complete your personal care plan.  Depending on the condition, your plan could have included both over the counter or prescription medications.  If there is a problem please reply  once you have received a response from your provider. Your safety is important to [place].  If you have drug allergies check your prescription carefully.    You can use MyChart to ask questions about today's visit, request a non-urgent call back, or ask for a work or school excuse for 24 hours related to this e-Visit. If it has been greater than 24 hours you will need to follow up with your provider, or enter a new e-Visit to address those concerns. You will get an e-mail in the next  two days asking about your experience.  I hope that your e-visit has been valuable and will speed your recovery. Thank you for using e-visits.  5-10 minutes spent reviewing and documenting in chart.

## 2019-08-09 MED ORDER — BUDESONIDE-FORMOTEROL FUMARATE 80-4.5 MCG/ACT IN AERO: INHALATION_SPRAY | RESPIRATORY_TRACT | 1 refills | Status: DC

## 2019-08-09 MED FILL — SYMBICORT 80-4.5 MCG INH: 80-4.5 | 30 days supply | Qty: 10 | Fill #0

## 2019-08-12 DIAGNOSIS — H9202 Otalgia, left ear: Secondary | ICD-10-CM

## 2019-08-12 MED ORDER — METOPROLOL SUCCINATE ER 25 MG TABLET,EXTENDED RELEASE 24 HR
25 | ORAL_TABLET | Freq: Every day | ORAL | 2 refills | 90.00000 days | Status: AC
Start: 2019-08-12 — End: 2020-02-08

## 2019-08-16 ENCOUNTER — Telehealth (INDEPENDENT_AMBULATORY_CARE_PROVIDER_SITE_OTHER): Payer: 59 | Admitting: Cardiology

## 2019-08-16 ENCOUNTER — Telehealth: Payer: Self-pay | Admitting: Cardiology

## 2019-08-16 ENCOUNTER — Encounter: Payer: Self-pay | Admitting: Cardiology

## 2019-08-16 VITALS — Ht 68.0 in | Wt 220.0 lb

## 2019-08-16 DIAGNOSIS — Z720 Tobacco use: Secondary | ICD-10-CM | POA: Diagnosis not present

## 2019-08-16 DIAGNOSIS — R002 Palpitations: Secondary | ICD-10-CM

## 2019-08-16 DIAGNOSIS — R0600 Dyspnea, unspecified: Secondary | ICD-10-CM

## 2019-08-16 NOTE — Patient Instructions (Signed)

## 2019-08-16 NOTE — Telephone Encounter (Signed)
  Called patient to reschedule missed appointment

## 2019-08-17 ENCOUNTER — Telehealth: Payer: Self-pay | Admitting: Cardiology

## 2019-08-17 NOTE — Telephone Encounter (Signed)
Spoke to patient regarding appointment.

## 2019-08-19 MED ORDER — SERTRALINE 50 MG TABLET
50 | ORAL_TABLET | ORAL | 4 refills | 90.00000 days | Status: AC
Start: 2019-08-19 — End: 2020-08-20

## 2019-08-19 MED FILL — LEVOTHYROXINE SODIUM 112 MC: 112 | 90 days supply | Qty: 90 | Fill #0

## 2019-08-23 ENCOUNTER — Encounter (HOSPITAL_COMMUNITY): Payer: Self-pay

## 2019-08-23 ENCOUNTER — Other Ambulatory Visit: Payer: Self-pay

## 2019-08-23 ENCOUNTER — Ambulatory Visit (HOSPITAL_COMMUNITY)
Admission: EM | Admit: 2019-08-23 | Discharge: 2019-08-23 | Disposition: A | Discharge status: Home / Self Care | Payer: 59 | Attending: Urgent Care | Admitting: Urgent Care

## 2019-08-23 DIAGNOSIS — L237 Allergic contact dermatitis due to plants, except food: Secondary | ICD-10-CM

## 2019-08-23 DIAGNOSIS — R21 Rash and other nonspecific skin eruption: Secondary | ICD-10-CM

## 2019-08-23 DIAGNOSIS — L299 Pruritus, unspecified: Secondary | ICD-10-CM

## 2019-08-23 MED ORDER — METHYLPREDNISOLONE ACETATE 80 MG/ML IJ SUSP
80.0000 mg | Freq: Once | INTRAMUSCULAR | Status: AC
  Administered 2019-08-23: 80 mg via INTRAMUSCULAR

## 2019-08-23 MED ORDER — METHYLPREDNISOLONE ACETATE 80 MG/ML IJ SUSP
INTRAMUSCULAR | Status: AC
  Filled 2019-08-23: qty 1

## 2019-08-23 MED ORDER — METHYLPREDNISOLONE SODIUM SUCC 125 MG IJ SOLR
INTRAMUSCULAR | Status: AC
  Filled 2019-08-23: qty 2

## 2019-08-23 MED ORDER — HYDROXYZINE HCL 25 MG PO TABS: 25.0000 mg | ORAL_TABLET | Freq: Four times a day (QID) | ORAL | 0 refills | Status: DC

## 2019-08-23 NOTE — ED Triage Notes (Signed)
Patient got poison ivy on Sunday, woke up itching arm in the middle of the night. Today, rash has spread to face.

## 2019-08-23 NOTE — ED Provider Notes (Signed)
[facility]   MRN: [id number] DOB: 1970/08/04  Subjective:   Shari Payne is a 49 y.o. female presenting for 2-day history of acute onset pruritic rash over left lower side of face and forearms bilaterally.  Patient came into contact with poison ivy and has a history of rashes with this.  She is requesting steroid injection as she has done much better with this as opposed to oral steroid course.  No current facility-administered medications for this encounter.  Current Outpatient Medications:  .  ALPRAZolam (XANAX) 0.5 MG tablet, TAKE 1 TABLET BY MOUTH 3 TIMES A DAY AS NEEDED FOR ANXIETY. TRY TO CUT BACK ON HOW MUCH TAKING, NEEDS TO LAST CLOSE TO 3 MONTHS, Disp: 90 tablet, Rfl: 0 .  budesonide-formoterol (SYMBICORT) 80-4.5 MCG/ACT inhaler, Inhale 2 puffs daily twice a day, wash your mouth afterwards, Disp: 1 Inhaler, Rfl: 1 .  Cholecalciferol (VITAMIN D PO), Take 5,000 Units by mouth daily., Disp: , Rfl:  .  cyclobenzaprine (FLEXERIL) 10 MG tablet, Take 1 tablet (10 mg total) by mouth every 8 (eight) hours as needed for muscle spasms., Disp: 60 tablet, Rfl: 1 .  escitalopram (LEXAPRO) 20 MG tablet, TAKE 1 TABLET BY MOUTH ONCE A DAY FOR MOOD, Disp: 90 tablet, Rfl: 1 .  famotidine (PEPCID) 20 MG tablet, Take 1 tablet (20 mg total) by mouth 2 (two) times daily. (Patient taking differently: Take 20 mg by mouth at bedtime. ), Disp: 180 tablet, Rfl: 1 .  fluticasone (FLONASE) 50 MCG/ACT nasal spray, Place 2 sprays into both nostrils daily. (Patient taking differently: Place 2 sprays into both nostrils as needed. ), Disp: 16 g, Rfl: 0 .  levothyroxine (SYNTHROID, LEVOTHROID) 112 MCG tablet, TAKE 1 TABLET BY MOUTH ONCE DAILY BEFORE BREAKFAST., Disp: 90 tablet, Rfl: 4 .  metoprolol succinate (TOPROL-XL) 50 MG 24 hr tablet, Take 1 tablet (50 mg total) by mouth daily. Take with or immediately following a meal., Disp: 90 tablet, Rfl: 3 .  omeprazole (PRILOSEC) 20 MG capsule, Take 1 capsule (20  mg total) by mouth as needed., Disp: 90 capsule, Rfl: 0 .  oxybutynin (DITROPAN-XL) 10 MG 24 hr tablet, Take 1 tablet Daily for Bladder Control, Disp: 90 tablet, Rfl: 3   Allergies  Allergen Reactions  . Amoxicillin Rash and Other (See Comments)    FEVER  . Bisoprolol Anxiety  . Metoprolol Tartrate Palpitations  . Verapamil Rash    Past Medical History:  Diagnosis Date  . Anxiety   . Breast hypertrophy 03/2014  . GERD (gastroesophageal reflux disease)   . Hyperlipidemia   . Hypothyroidism   . Interstitial cystitis      Past Surgical History:  Procedure Laterality Date  . BREAST BIOPSY Left   . BREAST REDUCTION SURGERY Bilateral 04/18/2014   Procedure: MAMMARY REDUCTION  (BREAST) BILATERAL;  Surgeon: Charlene Brooke, MD;  Location: [facility];  Service: Plastics;  Laterality: Bilateral;    Family History  Problem Relation Age of Onset  . CVA Maternal Grandfather 17  . Osteosarcoma Sister 12  . Heart failure Paternal Grandfather        90s  . Breast cancer Neg Hx     Social History   Tobacco Use  . Smoking status: Current Some Day Smoker    Packs/day: 0.50    Years: 30.00    Pack years: 15.00    Types: Cigarettes  . Smokeless tobacco: Never Used  . Tobacco comment: currently down to 0.25 pack/day or so  Substance Use Topics  . Alcohol use: Yes    Alcohol/week: 10.0 standard drinks    Types: 10 Standard drinks or equivalent per week    Comment: weekends  . Drug use: No    ROS   Objective:   Vitals: BP 121/84 (BP Location: Left Arm)   Pulse 71   Temp 98.5 F (36.9 C) (Oral)   Resp 16   LMP 08/16/2019   SpO2 99%   Physical Exam Constitutional:      General: She is not in acute distress.    Appearance: Normal appearance. She is well-developed. She is not ill-appearing, toxic-appearing or diaphoretic.  HENT:     Head: Normocephalic and atraumatic.     Nose: Nose normal.     Mouth/Throat:     Mouth: Mucous membranes are moist.      Pharynx: Oropharynx is clear.  Eyes:     General: No scleral icterus.    Extraocular Movements: Extraocular movements intact.     Pupils: Pupils are equal, round, and reactive to light.  Cardiovascular:     Rate and Rhythm: Normal rate.  Pulmonary:     Effort: Pulmonary effort is normal.  Skin:    General: Skin is warm and dry.     Findings: Erythema and rash (Erythematous patch over left lower jawline and forearms bilaterally over ventral surface) present.  Neurological:     General: No focal deficit present.     Mental Status: She is alert and oriented to person, place, and time.  Psychiatric:        Mood and Affect: Mood normal.        Behavior: Behavior normal.     Assessment and Plan :   1. Poison ivy dermatitis   2. Rash and nonspecific skin eruption   3. Itching     IM Depo-Medrol in clinic.  Hydroxyzine as an outpatient.  Counseled patient on the fact that oral prednisone course can be used for a longer time is required for poison ivy dermatitis.  However, patient insisted on having the steroid injection.  Will come back to the clinic if she ends up needing an oral steroid course. Counseled patient on potential for adverse effects with medications prescribed/recommended today, ER and return-to-clinic precautions discussed, patient verbalized understanding.    Jaynee Eagles, [place] 08/23/19 1931

## 2019-08-24 MED ORDER — TRIAMCINOLONE ACETONIDE 0.5 % EX CREA: 1.0000 "application " | TOPICAL_CREAM | Freq: Two times a day (BID) | CUTANEOUS | 2 refills | Status: DC

## 2019-08-24 MED FILL — TRIAMCINOLONE 0.5% CREAM: 0.5 | 15 days supply | Qty: 60 | Fill #0

## 2019-08-24 MED FILL — hydrOXYzine HCL 25 MG TABS: 25 | 8 days supply | Qty: 30 | Fill #0

## 2019-08-25 MED ORDER — DEXAMETHASONE 0.5 MG PO TABS: ORAL_TABLET | ORAL | 0 refills | Status: DC

## 2019-08-25 MED FILL — DEXAMETHASONE 1 MG TABLET: 1 | 7 days supply | Qty: 5 | Fill #0

## 2019-08-29 ENCOUNTER — Encounter: Payer: Self-pay | Admitting: Cardiology

## 2019-08-30 ENCOUNTER — Other Ambulatory Visit: Payer: Self-pay | Admitting: Cardiology

## 2019-08-30 ENCOUNTER — Other Ambulatory Visit: Payer: Self-pay | Admitting: Adult Health

## 2019-08-30 MED FILL — METOPROLOL SUCCINATE ER 50: 50 | 90 days supply | Qty: 90 | Fill #0

## 2019-09-05 MED FILL — TRIAMCINOLONE 0.5% CREAM: 0.5 | 15 days supply | Qty: 60 | Fill #1

## 2019-09-21 NOTE — Progress Notes (Deleted)
FOLLOW UP  Assessment and Plan:   Tachycardia Continue to cut down on caffeine, ETOH, smoking Continue toprol; symptoms have improved/resolved  Gastroesophageal reflux disease with esophagitis Continue PPI/H2 blocker, diet discussed  Thyroid disease Hypothyroidism-check TSH level, continue medications the same, reminded to take on an empty stomach 30-83mins before food.  -     TSH  Interstitial cystitis Avoid triggers, doing well with oxybutynin  Vitamin D deficiency Near goal at recent check; continue to recommend supplementation for goal of 70-100 Defer vitamin D level  Generalized anxiety disorder/insomnia continue medications, stress management techniques discussed, increase water, good sleep hygiene discussed, increase exercise, and increase veggies.  Medication management -     CMP/GFR -     Magnesium  Hyperlipidemia Working on lifestyle modification, did not tolerate even low dose of crestor, will try low dose of lipitor Discussed with patient and she is in agreement Continue low cholesterol diet and exercise.  Check lipid panel.  -     Lipid panel  Obesity Long discussion about weight loss, diet, and exercise Recommended diet heavy in fruits and veggies and low in animal meats, cheeses, and dairy products, appropriate calorie intake Discussed appropriate weight for height and initial goal 210 Follow up at next visit  Continue diet and meds as discussed. Further disposition pending results of labs. Discussed med's effects and SE's.   Over 30 minutes of exam, counseling, chart review, and critical decision making was performed.   Future Appointments  Date Time Provider [place]  09/22/2019  8:45 AM Vicie Mutters, PA-C [facility] None  12/21/2019 10:00 AM Liane Comber, NP [facility] None    ----------------------------------------------------------------------------------------------------------------------  HPI 49 y.o. female  presents for  follow up on anxiety/insomnia, tachycardia, cholesterol, glucose management, weight and vitamin D deficiency.   She saw Dr. McDiarmid for ICS, is now on oxybutynin and reports nearly fully resolved symptoms on med.   She has had ongoing tachycardia/palpitations and was having dyspnea associated with this and was worked up by cardiology; Holter showed some PVCs/PACs, ECHO from 10/2017 was unremarkable. She has started to smoke again, has some chantix at home. She is on toprol XL with improvement in symptoms.   She has been prescribed xanax PRN for anxiety; she currently uses 0.25 mg at night if needed to sleep. She uses lexapro 20 mg daily.   BMI is There is no height or weight on file to calculate BMI., she has not been working out or doing as well with diet due to the pandemic. She has had both vaccines.  Wt Readings from Last 3 Encounters:  08/16/19 220 lb (99.8 kg)  06/21/19 222 lb (100.7 kg)  01/25/19 218 lb (98.9 kg)   Her blood pressure has been controlled at home, today their BP is    She does workout. She denies chest pain, shortness of breath, dizziness.   She is not on cholesterol medication. Her cholesterol is not at goal. The cholesterol last visit was:   Lab Results  Component Value Date   CHOL 216 (H) 06/21/2019   HDL 66 06/21/2019   LDLCALC 123 (H) 06/21/2019   TRIG 158 (H) 06/21/2019   CHOLHDL 3.3 06/21/2019   Last A1C in the office was:  Lab Results  Component Value Date   HGBA1C 5.4 06/21/2019   She is on thyroid medication. Her medication was not changed last visit.   Lab Results  Component Value Date   TSH 2.05 06/21/2019   Patient is on Vitamin D supplement.  Lab Results  Component Value Date   VD25OH 48 06/21/2019       Current Medications:   Current Outpatient Medications (Endocrine & Metabolic):  .  dexamethasone (DECADRON) 0.5 MG tablet, take 1 tablet PO BID for 3 days, then take 1 tablet PO for 4 days. Marland Kitchen  levothyroxine (SYNTHROID, LEVOTHROID)  112 MCG tablet, TAKE 1 TABLET BY MOUTH ONCE DAILY BEFORE BREAKFAST.  Current Outpatient Medications (Cardiovascular):  .  metoprolol succinate (TOPROL-XL) 50 MG 24 hr tablet, TAKE 1 TABLET (50 MG TOTAL) BY MOUTH DAILY. TAKE WITH OR IMMEDIATELY FOLLOWING A MEAL.  Current Outpatient Medications (Respiratory):  .  budesonide-formoterol (SYMBICORT) 80-4.5 MCG/ACT inhaler, Inhale 2 puffs daily twice a day, wash your mouth afterwards .  fluticasone (FLONASE) 50 MCG/ACT nasal spray, Place 2 sprays into both nostrils daily. (Patient taking differently: Place 2 sprays into both nostrils as needed. )    Current Outpatient Medications (Other):  Marland Kitchen  ALPRAZolam (XANAX) 0.5 MG tablet, TAKE 1 TABLET BY MOUTH 3 TIMES A DAY AS NEEDED FOR ANXIETY. TRY TO CUT BACK ON HOW MUCH TAKING, NEEDS TO LAST CLOSE TO 3 MONTHS .  Cholecalciferol (VITAMIN D PO), Take 5,000 Units by mouth daily. .  cyclobenzaprine (FLEXERIL) 10 MG tablet, Take 1 tablet (10 mg total) by mouth every 8 (eight) hours as needed for muscle spasms. Marland Kitchen  escitalopram (LEXAPRO) 20 MG tablet, TAKE 1 TABLET BY MOUTH ONCE A DAY FOR MOOD .  famotidine (PEPCID) 20 MG tablet, Take 1 tablet (20 mg total) by mouth 2 (two) times daily. (Patient taking differently: Take 20 mg by mouth at bedtime. ) .  hydrOXYzine (ATARAX/VISTARIL) 25 MG tablet, Take 1 tablet (25 mg total) by mouth every 6 (six) hours. Marland Kitchen  omeprazole (PRILOSEC) 20 MG capsule, Take 1 capsule (20 mg total) by mouth as needed. Marland Kitchen  oxybutynin (DITROPAN-XL) 10 MG 24 hr tablet, Take 1 tablet Daily for Bladder Control .  triamcinolone cream (KENALOG) 0.5 %, Apply 1 application topically 2 (two) times daily.   Allergies:  Allergies  Allergen Reactions  . Amoxicillin Rash and Other (See Comments)    FEVER  . Bisoprolol Anxiety  . Metoprolol Tartrate Palpitations  . Verapamil Rash     Medical History:  Past Medical History:  Diagnosis Date  . Anxiety   . Breast hypertrophy 03/2014  . GERD  (gastroesophageal reflux disease)   . Hyperlipidemia   . Hypothyroidism   . Interstitial cystitis    Family history- Reviewed and unchanged Social history- Reviewed and unchanged   Review of Systems:  Review of Systems  Constitutional: Negative for malaise/fatigue and weight loss.  HENT: Negative for hearing loss and tinnitus.   Eyes: Negative for blurred vision and double vision.  Respiratory: Negative for cough, shortness of breath and wheezing.   Cardiovascular: Negative for chest pain, palpitations, orthopnea, claudication and leg swelling.  Gastrointestinal: Negative for abdominal pain, blood in stool, constipation, diarrhea, heartburn, melena, nausea and vomiting.  Genitourinary: Negative.   Musculoskeletal: Negative for joint pain and myalgias.  Skin: Negative for rash.  Neurological: Negative for dizziness, tingling, sensory change, weakness and headaches.  Endo/Heme/Allergies: Negative for polydipsia.  Psychiatric/Behavioral: The patient is nervous/anxious and has insomnia.   All other systems reviewed and are negative.     Physical Exam: There were no vitals taken for this visit. Wt Readings from Last 3 Encounters:  08/16/19 220 lb (99.8 kg)  06/21/19 222 lb (100.7 kg)  01/25/19 218 lb (98.9 kg)   General Appearance:  Well nourished, in no apparent distress. Eyes: PERRLA, EOMs, conjunctiva no swelling or erythema Sinuses: No Frontal/maxillary tenderness ENT/Mouth: Ext aud canals clear, TMs without erythema, bulging. No erythema, swelling, or exudate on post pharynx.  Tonsils not swollen or erythematous. Hearing normal.  Neck: Supple, thyroid normal.  Respiratory: Respiratory effort normal, BS equal bilaterally without rales, rhonchi, wheezing or stridor.  Cardio: RRR with no MRGs. Brisk peripheral pulses without edema.  Abdomen: Soft, + BS.  Non tender, no guarding, rebound, hernias, masses. Lymphatics: Non tender without lymphadenopathy.  Musculoskeletal: Full  ROM, 5/5 strength, Normal gait Skin: Warm, dry without rashes, lesions, ecchymosis.  Neuro: Cranial nerves intact. No cerebellar symptoms.  Psych: Awake and oriented X 3, normal affect, Insight and Judgment appropriate.    Vicie Mutters, PA-C 8:33 AM [facility]

## 2019-09-22 ENCOUNTER — Ambulatory Visit: Payer: 59 | Admitting: Physician Assistant

## 2019-10-12 ENCOUNTER — Encounter: Admit: 2019-10-12 | Payer: PRIVATE HEALTH INSURANCE | Attending: Obstetrics and Gynecology | Primary: Internal Medicine

## 2019-10-12 NOTE — Progress Notes (Deleted)
FOLLOW UP  Assessment and Plan:   Tachycardia Continue to cut down on caffeine, ETOH, smoking Continue toprol; symptoms have improved/resolved  Gastroesophageal reflux disease with esophagitis Continue PPI/H2 blocker, diet discussed  Thyroid disease Hypothyroidism-check TSH level, continue medications the same, reminded to take on an empty stomach 30-76mins before food.  -     TSH  Interstitial cystitis Avoid triggers, doing well with oxybutynin  Vitamin D deficiency Near goal at recent check; continue to recommend supplementation for goal of 70-100 Defer vitamin D level  Generalized anxiety disorder/insomnia continue medications, stress management techniques discussed, increase water, good sleep hygiene discussed, increase exercise, and increase veggies.  Medication management -     CMP/GFR -     Magnesium  Hyperlipidemia Working on lifestyle modification, did not tolerate even low dose of crestor, will try low dose of lipitor Discussed with patient and she is in agreement Continue low cholesterol diet and exercise.  Check lipid panel.  -     Lipid panel  Obesity Long discussion about weight loss, diet, and exercise Recommended diet heavy in fruits and veggies and low in animal meats, cheeses, and dairy products, appropriate calorie intake Discussed appropriate weight for height and initial goal 210 Follow up at next visit  Continue diet and meds as discussed. Further disposition pending results of labs. Discussed med's effects and SE's.   Over 30 minutes of exam, counseling, chart review, and critical decision making was performed.   Future Appointments  Date Time Provider [place]  10/18/2019  2:30 PM Vicie Mutters, PA-C [facility] None  12/21/2019 10:00 AM Liane Comber, NP [facility] None    ----------------------------------------------------------------------------------------------------------------------  HPI 49 y.o. female  presents for  follow up on anxiety/insomnia, tachycardia, cholesterol, glucose management, weight and vitamin D deficiency.   She saw Dr. McDiarmid for ICS, is now on oxybutynin and reports nearly fully resolved symptoms on med.   She has had ongoing tachycardia/palpitations and was having dyspnea associated with this and was worked up by cardiology; Holter showed some PVCs/PACs, ECHO from 10/2017 was unremarkable. She has started to smoke again, has some chantix at home. She is on toprol XL with improvement in symptoms.   She has been prescribed xanax PRN for anxiety; she currently uses 0.25 mg at night if needed to sleep. She uses lexapro 20 mg daily.   BMI is There is no height or weight on file to calculate BMI., she has not been working out or doing as well with diet due to the pandemic. She has had both vaccines.  Wt Readings from Last 3 Encounters:  08/16/19 220 lb (99.8 kg)  06/21/19 222 lb (100.7 kg)  01/25/19 218 lb (98.9 kg)   Her blood pressure has been controlled at home, today their BP is    She does workout. She denies chest pain, shortness of breath, dizziness.   She is not on cholesterol medication. Her cholesterol is not at goal. The cholesterol last visit was:   Lab Results  Component Value Date   CHOL 216 (H) 06/21/2019   HDL 66 06/21/2019   LDLCALC 123 (H) 06/21/2019   TRIG 158 (H) 06/21/2019   CHOLHDL 3.3 06/21/2019   Last A1C in the office was:  Lab Results  Component Value Date   HGBA1C 5.4 06/21/2019   She is on thyroid medication. Her medication was not changed last visit.   Lab Results  Component Value Date   TSH 2.05 06/21/2019   Patient is on Vitamin D supplement.  Lab Results  Component Value Date   VD25OH 48 06/21/2019       Current Medications:   Current Outpatient Medications (Endocrine & Metabolic):    dexamethasone (DECADRON) 0.5 MG tablet, take 1 tablet PO BID for 3 days, then take 1 tablet PO for 4 days.   levothyroxine (SYNTHROID, LEVOTHROID)  112 MCG tablet, TAKE 1 TABLET BY MOUTH ONCE DAILY BEFORE BREAKFAST.  Current Outpatient Medications (Cardiovascular):    metoprolol succinate (TOPROL-XL) 50 MG 24 hr tablet, TAKE 1 TABLET (50 MG TOTAL) BY MOUTH DAILY. TAKE WITH OR IMMEDIATELY FOLLOWING A MEAL.  Current Outpatient Medications (Respiratory):    budesonide-formoterol (SYMBICORT) 80-4.5 MCG/ACT inhaler, Inhale 2 puffs daily twice a day, wash your mouth afterwards   fluticasone (FLONASE) 50 MCG/ACT nasal spray, Place 2 sprays into both nostrils daily. (Patient taking differently: Place 2 sprays into both nostrils as needed. )    Current Outpatient Medications (Other):    ALPRAZolam (XANAX) 0.5 MG tablet, TAKE 1 TABLET BY MOUTH 3 TIMES A DAY AS NEEDED FOR ANXIETY. TRY TO CUT BACK ON HOW MUCH TAKING, NEEDS TO LAST CLOSE TO 3 MONTHS   Cholecalciferol (VITAMIN D PO), Take 5,000 Units by mouth daily.   cyclobenzaprine (FLEXERIL) 10 MG tablet, Take 1 tablet (10 mg total) by mouth every 8 (eight) hours as needed for muscle spasms.   escitalopram (LEXAPRO) 20 MG tablet, TAKE 1 TABLET BY MOUTH ONCE A DAY FOR MOOD   famotidine (PEPCID) 20 MG tablet, Take 1 tablet (20 mg total) by mouth 2 (two) times daily. (Patient taking differently: Take 20 mg by mouth at bedtime. )   hydrOXYzine (ATARAX/VISTARIL) 25 MG tablet, Take 1 tablet (25 mg total) by mouth every 6 (six) hours.   omeprazole (PRILOSEC) 20 MG capsule, Take 1 capsule (20 mg total) by mouth as needed.   oxybutynin (DITROPAN-XL) 10 MG 24 hr tablet, Take 1 tablet Daily for Bladder Control   triamcinolone cream (KENALOG) 0.5 %, Apply 1 application topically 2 (two) times daily.   Allergies:  Allergies  Allergen Reactions   Amoxicillin Rash and Other (See Comments)    FEVER   Bisoprolol Anxiety   Metoprolol Tartrate Palpitations   Verapamil Rash     Medical History:  Past Medical History:  Diagnosis Date   Anxiety    Breast hypertrophy 03/2014   GERD  (gastroesophageal reflux disease)    Hyperlipidemia    Hypothyroidism    Interstitial cystitis    Family history- Reviewed and unchanged Social history- Reviewed and unchanged   Review of Systems:  Review of Systems  Constitutional: Negative for malaise/fatigue and weight loss.  HENT: Negative for hearing loss and tinnitus.   Eyes: Negative for blurred vision and double vision.  Respiratory: Negative for cough, shortness of breath and wheezing.   Cardiovascular: Negative for chest pain, palpitations, orthopnea, claudication and leg swelling.  Gastrointestinal: Negative for abdominal pain, blood in stool, constipation, diarrhea, heartburn, melena, nausea and vomiting.  Genitourinary: Negative.   Musculoskeletal: Negative for joint pain and myalgias.  Skin: Negative for rash.  Neurological: Negative for dizziness, tingling, sensory change, weakness and headaches.  Endo/Heme/Allergies: Negative for polydipsia.  Psychiatric/Behavioral: The patient is nervous/anxious and has insomnia.   All other systems reviewed and are negative.     Physical Exam: There were no vitals taken for this visit. Wt Readings from Last 3 Encounters:  08/16/19 220 lb (99.8 kg)  06/21/19 222 lb (100.7 kg)  01/25/19 218 lb (98.9 kg)   General Appearance:  Well nourished, in no apparent distress. Eyes: PERRLA, EOMs, conjunctiva no swelling or erythema Sinuses: No Frontal/maxillary tenderness ENT/Mouth: Ext aud canals clear, TMs without erythema, bulging. No erythema, swelling, or exudate on post pharynx.  Tonsils not swollen or erythematous. Hearing normal.  Neck: Supple, thyroid normal.  Respiratory: Respiratory effort normal, BS equal bilaterally without rales, rhonchi, wheezing or stridor.  Cardio: RRR with no MRGs. Brisk peripheral pulses without edema.  Abdomen: Soft, + BS.  Non tender, no guarding, rebound, hernias, masses. Lymphatics: Non tender without lymphadenopathy.  Musculoskeletal: Full  ROM, 5/5 strength, Normal gait Skin: Warm, dry without rashes, lesions, ecchymosis.  Neuro: Cranial nerves intact. No cerebellar symptoms.  Psych: Awake and oriented X 3, normal affect, Insight and Judgment appropriate.    Vicie Mutters, PA-C 4:07 PM [facility]

## 2019-10-18 ENCOUNTER — Ambulatory Visit: Payer: 59 | Admitting: Physician Assistant

## 2019-10-24 ENCOUNTER — Other Ambulatory Visit: Payer: Self-pay | Admitting: Physician Assistant

## 2019-10-24 ENCOUNTER — Other Ambulatory Visit: Payer: Self-pay | Admitting: Adult Health

## 2019-10-24 DIAGNOSIS — K2100 Gastro-esophageal reflux disease with esophagitis, without bleeding: Secondary | ICD-10-CM

## 2019-10-27 ENCOUNTER — Ambulatory Visit: Admit: 2019-10-27 | Payer: PRIVATE HEALTH INSURANCE | Attending: Obstetrics and Gynecology | Primary: Internal Medicine

## 2019-10-27 DIAGNOSIS — Z01419 Encounter for gynecological examination (general) (routine) without abnormal findings: Secondary | ICD-10-CM

## 2019-10-27 DIAGNOSIS — Z1231 Encounter for screening mammogram for malignant neoplasm of breast: Secondary | ICD-10-CM

## 2019-10-27 NOTE — Patient Instructions

## 2019-10-27 NOTE — Progress Notes
SUBJECTIVEJennifer Michael is a 49 y.o. G1P1001 woman who presents for annual exam.  Patient's last menstrual period was 10/14/2019.  She reports her menses are regular and cyclic qmonth.  She has no complaints, concerns or questions.PGYNHXLast Pap:  February 2017 was normal:SEXUAL WU:JWJXBJY currently sexually active - maleMarital Status:  marriedCurrent contraceptive method is noneSOCIAL NW:GNFAOZHYQMVH Hx: RNExercise:  walksDiet:  adequateTobacco/Alcohol: Former smoker / Socially uses alcoholAre you in a relationship that makes you uncomfortable:  noDo you want a chaperone for your visit today: noREVIEW OF SYSTEMS:Fever:  NormalWeight:  Body mass index is 29.1 kg/m?Marland KitchenEyes:  NormalEars, Nose, Throat:  NormalThyroid:  NormalBreasts:  NormalHeart:  NormalLungs:  NormalBladder, Kidneys:  NormalGI, Stomach:  NormalVagina, Uterus, Ovaries: NormalBlood:  NormalNerves, Neurology:  NormalMuscles, Bones:  NormalPsychiatric:  NormalHEALTH MAINTENANCE:Mammogram:  Ordered test; last was 6/2020Breast Ultrasound:  Not IndicatedColonoscopy:  N/ADEXA Bone Density:  never PROBLEM LIST:Patient Active Problem List  Diagnosis Date Noted ? Allergic asthma  ? Multiple allergies  ? Hyperlipidemia  ? Hypertension  ? Vitamin D deficiency  ? Elevated alkaline phosphatase level  ? Tobacco abuse 09/05/2011 ? Anxiety 09/03/2011 POBHx:OB History Gravida Para Term Preterm AB Living 1 1 1     1  SAB TAB Ectopic Molar Multiple Live Births           1  # Outcome Date GA Lbr Len/2nd Weight Sex Delivery Anes PTL Lv 1 Term    3.37 kg M CS-LTranv EPI N LIV PMHx:Past Medical History: Diagnosis Date ? Allergic asthma   to dog dander ? Anxiety  ? Elevated alkaline phosphatase level   Abd U/S, labs done for eval ? Hyperlipidemia  ? Hypertension  ? Multiple allergies   Dr [facility] Alllergist - allergic to dogs, cats, grass ? Vitamin D deficiency  PSHx:Past Surgical History: Procedure Laterality Date ? CESAREAN SECTION  2014  x 1 ? LASIK Bilateral  ? SEPTOPLASTY  1990s MEDS:Current Outpatient Medications Medication Sig Dispense Refill ? albuterol sulfate (PROAIR DIGIHALER) 90 mcg/actuation aebs Inhale 180 mcg into the lungs every 4 (four) hours.   ? cholecalciferol (VITAMIN D3) 2,000 unit capsule Take 2,000 Units by mouth daily.   ? fish oil-omega-3 fatty acids 1,000 mg capsule Take 2 g by mouth daily.   ? fluticasone furoate-vilanterol (BREO ELLIPTA) 100-25 mcg/dose blister powder for inhalation Inhale 1 puff into the lungs daily.   ? LORazepam (ATIVAN) 0.5 mg tablet Take 1 tablet (0.5 mg total) by mouth daily as needed for anxiety. 30 tablet 0 ? metoprolol succinate XL (TOPROL-XL) 25 mg 24 hr tablet TAKE 1 TABLET (25 MG TOTAL) BY MOUTH DAILY. TAKE WITH OR IMMEDIATELY FOLLOWING A MEAL. 90 tablet 1 ? multivitamin (MULTIVITAMIN) tablet Take 1 tablet by mouth daily.   ? omeprazole (PRILOSEC OTC) 20 MG tablet Take 20 mg by mouth daily.   ? sertraline (ZOLOFT) 50 mg tablet TAKE 1 TABLET BY MOUTH EVERY DAY 90 tablet 3 No current facility-administered medications for this visit.  ALLERGIES:No Known AllergiesFAMILY QI:ONGEXB History Problem Relation Age of Onset ? Hypertension Mother  ? High cholesterol Mother  ? Barrett's esophagus Mother  ? Hypertension Sister  ? Lung cancer Father       died at 60, smoker ? Prostate cancer Maternal Grandfather       died in his 83's ? Sarcoma Maternal Aunt 52      died at 50 of leiomyosarcoma ? Colon cancer Maternal Uncle       died in his 43's ? Breast cancer Maternal  Aunt       in her 90's ? No Known Problems Brother  OBJECTIVEHEIGHT:   5' 1.2 (1.554 m)     WEIGHT:   70.3 kg	BMI:   Body mass index is 29.1 kg/m?Marland KitchenBLOOD PRESSURE: 120/84EXAM:	General:  no acute distress	Skin:  unremarkable	Neck/Thyroid:  no nodules	Breast:  no LAN, no masses bilaterally	Lungs:  clear to auscultation	Heart:  regular rate and rhythm	Abdomen:  soft, nontender	Vulva:  no lesions	Vagina:  normal	Cervix:  no CMT	Uterus:  mobile, nontender	Adnexae:  no masses or tenderness bilaterally	Rectum:  deferred	Extremities:  unremarkable, no calf tenderness	CVT: No CVA tendernessPOC Testing:    Results for orders placed or performed in visit on 07/12/19 Hemoglobin A1c Result Value Ref Range  Hemoglobin A1c 4.9 <5.7 % of total Hgb Lipid panel Result Value Ref Range  Cholesterol, Total 256 (H) <200 mg/dL  HDL 70 > OR = 50 mg/dL  Triglycerides 962 <952 mg/dL  LDL Cholesterol 841 (H) mg/dL (calc)  Chol/HDL Ratio 3.7 <5.0 (calc)  Non-HDL Cholesterol 186 (H) <130 mg/dL (calc) Vitamin D, 32-GMWNUUV Result Value Ref Range  Vitamin D, 25 OH, Total 24 (L) 30 - 100 ng/mL TSH w/reflex to FT4     (BH GH LMW Q YH) Result Value Ref Range  TSH, 3rd Generation w/ Reflex to FT4 2.33 mIU/L CBC and differential Result Value Ref Range  WBC 6.1 3.8 - 10.8 Thousand/uL  RBC 4.26 3.80 - 5.10 Million/uL  Hemoglobin 13.2 11.7 - 15.5 g/dL  Hematocrit 25.3 66.4 - 45.0 %  MCV 91.3 80.0 - 100.0 fL  MCH 31.0 27.0 - 33.0 pg  MCHC 33.9 32.0 - 36.0 g/dL  RDW 40.3 47.4 - 25.9 %  Platelets 382 140 - 400 Thousand/uL  MPV 9.8 7.5 - 12.5 fL  Neutrophils Absolute 3,672 1,500 - 7,800 cells/uL  Lymphocytes Absolute 1,708 850 - 3,900 cells/uL  Monocytes Absolute 561 200 - 950 cells/uL  Eosinophils Absolute 140 15 - 500 cells/uL  Basophils Absolute 18 0 - 200 cells/uL  Neutrophils 60.2 %  Lymphocytes 28.0 %  Monocytes 9.2 %  Eosinophils 2.3 %  Basophils 0.3 % Comprehensive metabolic panel Result Value Ref Range  Glucose 88 65 - 99 mg/dL  BUN 8 7 - 25 mg/dL  Creatinine 5.63 8.75 - 1.10 mg/dL eGFR (NON African-American) 105 > OR = 60 mL/min/1.76m2  eGFR (Afr Amer) 122 > OR = 60 mL/min/1.60m2  BUN/Creatinine Ratio NOT APPLICABLE 6 - 22 (calc)  Sodium 138 135 - 146 mmol/L  Potassium 4.1 3.5 - 5.3 mmol/L  Chloride 102 98 - 110 mmol/L  CO2 24 20 - 32 mmol/L  Calcium 9.8 8.6 - 10.2 mg/dL  Protein, Total 7.3 6.1 - 8.1 g/dL  Albumin 4.6 3.6 - 5.1 g/dL  Globulin 2.7 1.9 - 3.7 g/dL (calc)  Albumin/Globulin Ratio 1.7 1.0 - 2.5 (calc)  Bilirubin, Total 0.7 0.2 - 1.2 mg/dL  Alkaline Phosphatase 643 31 - 125 U/L  AST 37 (H) 10 - 35 U/L  ALT 38 (H) 6 - 29 U/L ASSESSMENTAnnual - unremarkable gyn exam1. Well woman exam with routine gynecological exam  Cytology gyn cases     ([facility] LM[facility]) 2. Screening mammogram, encounter for  Mammography Screening '	PLAN - Pap smear guidelines discussed.  Pap smear was performed.   - Self Breast Exam - Yearly mammography  - Healthy lifestyle - Encouraged regular exercise and adequate calcium / vitamin D intake - RTO in one year and as neededI, Minerva Areola, personally scribed for  Caprice Red MD .  Signed: Minerva Areola  Date 6/10/2021Portions  of this note were transcribed by a scribe.  I, Satrina Magallanes R. Sharonann Malbrough, MD, personally performed the history, physical exam and medical decision making and confirmed the accuracy of the information in the transcribed note.Signed: *Jake Goodson R. Geremiah Fussell, MD 10/27/2019 1:12 PM

## 2019-11-02 ENCOUNTER — Telehealth: Admit: 2019-11-02 | Payer: PRIVATE HEALTH INSURANCE | Attending: Obstetrics and Gynecology | Primary: Internal Medicine

## 2019-11-02 NOTE — Other
left message

## 2019-11-02 NOTE — Telephone Encounter
Patient states she missed a call from Dr. Virgie Dad.  She can be reached [phone] -[phone].

## 2019-11-03 NOTE — Progress Notes (Signed)
FOLLOW UP  Assessment and Plan:   Tachycardia Continue to cut down on caffeine, ETOH, smoking Continue toprol; symptoms have improved/resolved  Gastroesophageal reflux disease with esophagitis Continue PPI/H2 blocker, diet discussed  Thyroid disease Hypothyroidism-check TSH level, continue medications the same, reminded to take on an empty stomach 30-58mins before food.  -     TSH  Interstitial cystitis Avoid triggers, doing well with oxybutynin  Vitamin D deficiency Near goal at recent check; continue to recommend supplementation for goal of 70-100 Defer vitamin D level  Generalized anxiety disorder/insomnia continue medications, stress management techniques discussed, increase water, good sleep hygiene discussed, increase exercise, and increase veggies.  Medication management -     CMP/GFR -     Magnesium  Hyperlipidemia Working on lifestyle modification, did not tolerate even low dose of crestor, will try low dose of lipitor Discussed with patient and she is in agreement Continue low cholesterol diet and exercise.  Check lipid panel.  -     Lipid panel  Obesity Long discussion about weight loss, diet, and exercise Recommended diet heavy in fruits and veggies and low in animal meats, cheeses, and dairy products, appropriate calorie intake Discussed appropriate weight for height and initial goal 210 Follow up at next visit  Continue diet and meds as discussed. Further disposition pending results of labs. Discussed med's effects and SE's.   Over 30 minutes of exam, counseling, chart review, and critical decision making was performed.   Future Appointments  Date Time Provider [place]  12/21/2019 10:00 AM Liane Comber, NP [facility] None    ----------------------------------------------------------------------------------------------------------------------  HPI 49 y.o. female  presents for follow up on anxiety/insomnia, tachycardia, cholesterol,  glucose management, weight and vitamin D deficiency.   She saw Dr. McDiarmid for ICS, is now on oxybutynin and reports nearly fully resolved symptoms on med.   Had palpitations,  Holter showed some PVCs/PACs, ECHO from 10/2017 was unremarkable. She is on toprol XL with improvement in symptoms.   She has had some GERD, states prilosec helps.   She has been prescribed xanax PRN for anxiety; she currently uses 0.25 mg at night if needed to sleep. She uses lexapro 20 mg daily.   BMI is Body mass index is 32.69 kg/m., she has not been working out or doing as well with diet due to the pandemic. She has had both vaccines.  Wt Readings from Last 3 Encounters:  11/04/19 215 lb (97.5 kg)  08/16/19 220 lb (99.8 kg)  06/21/19 222 lb (100.7 kg)   Her blood pressure has been controlled at home, today their BP is BP: 128/76  She does workout. She denies chest pain, shortness of breath, dizziness.    She is not on cholesterol medication. Her cholesterol is not at goal. The cholesterol last visit was:   Lab Results  Component Value Date   CHOL 216 (H) 06/21/2019   HDL 66 06/21/2019   LDLCALC 123 (H) 06/21/2019   TRIG 158 (H) 06/21/2019   CHOLHDL 3.3 06/21/2019   Last A1C in the office was:  Lab Results  Component Value Date   HGBA1C 5.4 06/21/2019   She is on thyroid medication. Her medication was not changed last visit.   Lab Results  Component Value Date   TSH 2.05 06/21/2019   Patient is on Vitamin D supplement.   Lab Results  Component Value Date   VD25OH 48 06/21/2019       Current Medications:   Current Outpatient Medications (Endocrine & Metabolic):  .  levothyroxine (SYNTHROID, LEVOTHROID) 112 MCG tablet, TAKE 1 TABLET BY MOUTH ONCE DAILY BEFORE BREAKFAST.  Current Outpatient Medications (Cardiovascular):  .  metoprolol succinate (TOPROL-XL) 50 MG 24 hr tablet, TAKE 1 TABLET (50 MG TOTAL) BY MOUTH DAILY. TAKE WITH OR IMMEDIATELY FOLLOWING A MEAL.  Current Outpatient  Medications (Respiratory):  .  budesonide-formoterol (SYMBICORT) 80-4.5 MCG/ACT inhaler, Inhale 2 puffs daily twice a day, wash your mouth afterwards .  fluticasone (FLONASE) 50 MCG/ACT nasal spray, Place 2 sprays into both nostrils daily. (Patient taking differently: Place 2 sprays into both nostrils as needed. )    Current Outpatient Medications (Other):  Marland Kitchen  ALPRAZolam (XANAX) 0.5 MG tablet, TAKE 1 TABLET BY MOUTH 3 TIMES A DAY AS NEEDED FOR ANXIETY. TRY TO CUT BACK ON HOW MUCH TAKING, NEEDS TO LAST CLOSE TO 3 MONTHS .  Cholecalciferol (VITAMIN D PO), Take 5,000 Units by mouth daily. .  cyclobenzaprine (FLEXERIL) 10 MG tablet, Take 1 tablet (10 mg total) by mouth every 8 (eight) hours as needed for muscle spasms. Marland Kitchen  escitalopram (LEXAPRO) 20 MG tablet, TAKE 1 TABLET BY MOUTH ONCE A DAY FOR MOOD .  omeprazole (PRILOSEC) 20 MG capsule, TAKE 1 CAPSULE (20 MG TOTAL) BY MOUTH DAILY AS NEEDED. Marland Kitchen  oxybutynin (DITROPAN-XL) 10 MG 24 hr tablet, Take 1 tablet Daily for Bladder Control   Allergies:  Allergies  Allergen Reactions  . Amoxicillin Rash and Other (See Comments)    FEVER  . Bisoprolol Anxiety  . Metoprolol Tartrate Palpitations  . Verapamil Rash     Medical History:  Past Medical History:  Diagnosis Date  . Anxiety   . Breast hypertrophy 03/2014  . GERD (gastroesophageal reflux disease)   . Hyperlipidemia   . Hypothyroidism   . Interstitial cystitis    Family history- Reviewed and unchanged Social history- Reviewed and unchanged   Review of Systems:  Review of Systems  Constitutional: Negative for malaise/fatigue and weight loss.  HENT: Negative for hearing loss and tinnitus.   Eyes: Negative for blurred vision and double vision.  Respiratory: Negative for cough, shortness of breath and wheezing.   Cardiovascular: Negative for chest pain, palpitations, orthopnea, claudication and leg swelling.  Gastrointestinal: Negative for abdominal pain, blood in stool, constipation,  diarrhea, heartburn, melena, nausea and vomiting.  Genitourinary: Negative.   Musculoskeletal: Negative for joint pain and myalgias.  Skin: Negative for rash.  Neurological: Negative for dizziness, tingling, sensory change, weakness and headaches.  Endo/Heme/Allergies: Negative for polydipsia.  Psychiatric/Behavioral: The patient is nervous/anxious and has insomnia.   All other systems reviewed and are negative.     Physical Exam: BP 128/76   Pulse 70   Temp (!) 97.5 F (36.4 C)   Wt 215 lb (97.5 kg)   SpO2 99%   BMI 32.69 kg/m  Wt Readings from Last 3 Encounters:  11/04/19 215 lb (97.5 kg)  08/16/19 220 lb (99.8 kg)  06/21/19 222 lb (100.7 kg)   General Appearance: Well nourished, in no apparent distress. Eyes: PERRLA, EOMs, conjunctiva no swelling or erythema Sinuses: No Frontal/maxillary tenderness ENT/Mouth: Ext aud canals clear, TMs without erythema, bulging. No erythema, swelling, or exudate on post pharynx.  Tonsils not swollen or erythematous. Hearing normal.  Neck: Supple, thyroid normal.  Respiratory: Respiratory effort normal, BS equal bilaterally without rales, rhonchi, wheezing or stridor.  Cardio: RRR with no MRGs. Brisk peripheral pulses without edema.  Abdomen: Soft, + BS.  Non tender, no guarding, rebound, hernias, masses. Lymphatics: Non tender without lymphadenopathy.  Musculoskeletal:  Full ROM, 5/5 strength, Normal gait Skin: Warm, dry without rashes, lesions, ecchymosis.  Neuro: Cranial nerves intact. No cerebellar symptoms.  Psych: Awake and oriented X 3, normal affect, Insight and Judgment appropriate.    Vicie Mutters, PA-C 11:24 AM [facility]

## 2019-11-04 ENCOUNTER — Ambulatory Visit: Payer: 59 | Admitting: Physician Assistant

## 2019-11-04 ENCOUNTER — Other Ambulatory Visit: Payer: Self-pay

## 2019-11-04 ENCOUNTER — Encounter: Payer: Self-pay | Admitting: Physician Assistant

## 2019-11-04 VITALS — BP 128/76 | HR 70 | Temp 97.5°F | Wt 215.0 lb

## 2019-11-04 DIAGNOSIS — E559 Vitamin D deficiency, unspecified: Secondary | ICD-10-CM | POA: Diagnosis not present

## 2019-11-04 DIAGNOSIS — Z130 Encounter for screening for diseases of the blood and blood-forming organs and certain disorders involving the immune mechanism: Secondary | ICD-10-CM

## 2019-11-04 DIAGNOSIS — F17200 Nicotine dependence, unspecified, uncomplicated: Secondary | ICD-10-CM | POA: Diagnosis not present

## 2019-11-04 DIAGNOSIS — F5105 Insomnia due to other mental disorder: Secondary | ICD-10-CM

## 2019-11-04 DIAGNOSIS — Z79899 Other long term (current) drug therapy: Secondary | ICD-10-CM | POA: Diagnosis not present

## 2019-11-04 DIAGNOSIS — R000 Tachycardia, unspecified: Secondary | ICD-10-CM | POA: Diagnosis not present

## 2019-11-04 DIAGNOSIS — E782 Mixed hyperlipidemia: Secondary | ICD-10-CM

## 2019-11-04 DIAGNOSIS — E669 Obesity, unspecified: Secondary | ICD-10-CM

## 2019-11-04 DIAGNOSIS — Z1212 Encounter for screening for malignant neoplasm of rectum: Secondary | ICD-10-CM

## 2019-11-04 DIAGNOSIS — F99 Mental disorder, not otherwise specified: Secondary | ICD-10-CM

## 2019-11-04 DIAGNOSIS — E079 Disorder of thyroid, unspecified: Secondary | ICD-10-CM

## 2019-11-04 NOTE — Patient Instructions (Signed)
Silent reflux: Not all heartburn burns...Marland KitchenMarland KitchenMarland Kitchen  What is LPR? Laryngopharyngeal reflux (LPR) or silent reflux is a condition in which acid that is made in the stomach travels up the esophagus (swallowing tube) and gets to the throat. Not everyone with reflux has a lot of heartburn or indigestion. In fact, many people with LPR never have heartburn. This is why LPR is called SILENT REFLUX, and the terms "Silent reflux" and "LPR" are often used interchangeably. Because LPR is silent, it is sometimes difficult to diagnose.  How can you tell if you have LPR?  Marland Kitchen Chronic hoarseness- Some people have hoarseness that comes and goes . throat clearing  . Cough . It can cause shortness of breath and cause asthma like symptoms. Marland Kitchen a feeling of a lump in the throat  . difficulty swallowing . a problem with too much nose and throat drainage.  . Some people will feel their esophagus spasm which feels like their heart beating hard and fast, this will usually be after a meal, at rest, or lying down at night.    How do I treat this? Treatment for LPR should be individualized, and your doctor will suggest the best treatment for you. Generally there are several treatments for LPR: . changing habits and diet to reduce reflux,  . medications to reduce stomach acid, and  . surgery to prevent reflux. Most people with LPR need to modify how and when they eat, as well as take some medication, to get well. Sometimes, nonprescription liquid antacids, such as Maalox, Gelucil and Mylanta are recommended. When used, these antacids should be taken four times each day - one tablespoon one hour after each meal and before bedtime. Dietary and lifestyle changes alone are not often enough to control LPR - medications that reduce stomach acid are also usually needed. These must be prescribed by our doctor.   TIPS FOR REDUCING REFLUX AND LPR Control your LIFE-STYLE and your DIET! Marland Kitchen If you use tobacco, QUIT.  Marland Kitchen Smoking makes you  reflux. After every cigarette you have some LPR.  . Don't wear clothing that is too tight, especially around the waist (trousers, corsets, belts).  . Do not lie down just after eating...in fact, do not eat within three hours of bedtime.  . You should be on a low-fat diet.  . Limit your intake of red meat.  . Limit your intake of butter.  Marland Kitchen Avoid fried foods.  . Avoid chocolate  . Avoid cheese.  Marland Kitchen Avoid eggs. Marland Kitchen Specifically avoid caffeine (especially coffee and tea), soda pop (especially cola) and mints.  . Avoid alcoholic beverages, particularly in the evening.

## 2019-11-05 LAB — CBC WITH DIFFERENTIAL/PLATELET
Absolute Monocytes: 596 cells/uL (ref 200–950)
Basophils Absolute: 53 cells/uL (ref 0–200)
Basophils Relative: 0.9 %
Eosinophils Absolute: 130 cells/uL (ref 15–500)
Eosinophils Relative: 2.2 %
HCT: 42.4 % (ref 35.0–45.0)
Hemoglobin: 14.2 g/dL (ref 11.7–15.5)
Lymphs Abs: 1564 cells/uL (ref 850–3900)
MCH: 30 pg (ref 27.0–33.0)
MCHC: 33.5 g/dL (ref 32.0–36.0)
MCV: 89.6 fL (ref 80.0–100.0)
MPV: 11.5 fL (ref 7.5–12.5)
Monocytes Relative: 10.1 %
Neutro Abs: 3558 cells/uL (ref 1500–7800)
Neutrophils Relative %: 60.3 %
Platelets: 300 10*3/uL (ref 140–400)
RBC: 4.73 10*6/uL (ref 3.80–5.10)
RDW: 12.6 % (ref 11.0–15.0)
Total Lymphocyte: 26.5 %
WBC: 5.9 10*3/uL (ref 3.8–10.8)

## 2019-11-05 LAB — COMPLETE METABOLIC PANEL WITH GFR
AG Ratio: 1.5 (calc) (ref 1.0–2.5)
ALT: 20 U/L (ref 6–29)
AST: 22 U/L (ref 10–35)
Albumin: 4.1 g/dL (ref 3.6–5.1)
Alkaline phosphatase (APISO): 59 U/L (ref 31–125)
BUN: 11 mg/dL (ref 7–25)
CO2: 28 mmol/L (ref 20–32)
Calcium: 9.6 mg/dL (ref 8.6–10.2)
Chloride: 104 mmol/L (ref 98–110)
Creat: 0.91 mg/dL (ref 0.50–1.10)
GFR, Est African American: 88 mL/min/{1.73_m2} (ref >=60)
GFR, Est Non African American: 76 mL/min/{1.73_m2} (ref >=60)
Globulin: 2.8 g/dL (calc) (ref 1.9–3.7)
Glucose, Bld: 87 mg/dL (ref 65–99)
Potassium: 4.8 mmol/L (ref 3.5–5.3)
Sodium: 139 mmol/L (ref 135–146)
Total Bilirubin: 0.5 mg/dL (ref 0.2–1.2)
Total Protein: 6.9 g/dL (ref 6.1–8.1)

## 2019-11-05 LAB — LIPID PANEL
Cholesterol: 207 mg/dL — ABNORMAL HIGH (ref <200)
HDL: 65 mg/dL (ref >=50)
LDL Cholesterol (Calc): 126 mg/dL (calc) — ABNORMAL HIGH
Non-HDL Cholesterol (Calc): 142 mg/dL (calc) — ABNORMAL HIGH (ref <130)
Total CHOL/HDL Ratio: 3.2 (calc) (ref <5.0)
Triglycerides: 65 mg/dL (ref <150)

## 2019-11-05 LAB — MAGNESIUM: Magnesium: 2.2 mg/dL (ref 1.5–2.5)

## 2019-11-05 LAB — TSH: TSH: 0.67 mIU/L

## 2019-11-05 LAB — IRON, TOTAL/TOTAL IRON BINDING CAP
%SAT: 23 % (calc) (ref 16–45)
Iron: 77 ug/dL (ref 40–190)
TIBC: 340 mcg/dL (calc) (ref 250–450)

## 2019-11-05 LAB — FERRITIN: Ferritin: 27 ng/mL (ref 16–232)

## 2019-11-07 NOTE — Other
left message

## 2019-11-11 NOTE — Other
Letter mailed pt 

## 2019-11-11 NOTE — Other
left message please send letter

## 2019-11-13 ENCOUNTER — Other Ambulatory Visit: Payer: Self-pay | Admitting: Physician Assistant

## 2019-11-13 DIAGNOSIS — Z1212 Encounter for screening for malignant neoplasm of rectum: Secondary | ICD-10-CM | POA: Diagnosis not present

## 2019-11-15 ENCOUNTER — Encounter: Admit: 2019-11-15 | Payer: PRIVATE HEALTH INSURANCE | Primary: Internal Medicine

## 2019-11-15 NOTE — Telephone Encounter
Error

## 2019-11-17 LAB — FECAL GLOBIN BY IMMUNOCHEMISTRY
FECAL GLOBIN RESULT:: NOT DETECTED
MICRO NUMBER:: 10652365
SPECIMEN QUALITY:: ADEQUATE

## 2019-11-21 MED FILL — LEVOTHYROXINE SODIUM 112 MC: 112 | 90 days supply | Qty: 90 | Fill #1

## 2019-11-23 NOTE — Other
Pt has not called office

## 2019-11-24 ENCOUNTER — Telehealth: Admit: 2019-11-24 | Payer: PRIVATE HEALTH INSURANCE | Attending: Obstetrics and Gynecology | Primary: Internal Medicine

## 2019-11-24 NOTE — Telephone Encounter
Deborah Michael Camille-Sep 30, 1970-PT RETURNING YOUR CALL. [phone].

## 2019-11-24 NOTE — Telephone Encounter
left message again.

## 2019-11-25 NOTE — Telephone Encounter
patient advised normal pap positive HPV will repeat pap in one year at annual

## 2019-12-07 ENCOUNTER — Encounter: Payer: 59 | Admitting: Adult Health

## 2019-12-21 ENCOUNTER — Encounter: Payer: 59 | Admitting: Adult Health

## 2019-12-28 NOTE — Progress Notes (Deleted)
Complete Physical  Assessment and Plan:  Encounter for general adult medical examination with abnormal findings  Tachycardia Continue to cut down on caffeine, ETOH, smoking Continue toprol; symptoms have improved/resolved Followed by cardiology; titrating BB with them - denies symptoms of bradycardia which is noted on EKG  Gastroesophageal reflux disease with esophagitis Symptoms well managed without breakthrough Will try to get off PPI given info for taper and pepcid sent in  Thyroid disease Hypothyroidism-check TSH level, continue medications the same, reminded to take on an empty stomach 30-63mins before food.  -     TSH  OAB Avoid triggers, continue oxybutynin  Anemia, unspecified type - monitor, continue iron supp with Vitamin C and increase green leafy veggies -     CBC with Differential/Platelet -     Vitamin B12  Vitamin D deficiency -     VITAMIN D 25 Hydroxy (Vit-D Deficiency, Fractures)  Generalized anxiety disorder/insomnia continue medications, stress management techniques discussed, increase water, good sleep hygiene discussed, increase exercise, and increase veggies.   Medication management -     CMP/GFR -     Magnesium  Hyperlipidemia -     Lipid panel  Screening for blood or protein in urine -     Urinalysis, Routine w reflex microscopic -     Microalbumin / creatinine urine ratio  Screening diabetes      -      A1C  Discussed med's effects and SE's. Screening labs and tests as requested with regular follow-up as recommended. Over 40 minutes of exam, counseling, chart review, and critical decision making was performed this visit.   Future Appointments  Date Time Provider [place]  12/29/2019 10:00 AM Vicie Mutters, PA-C [facility] None  01/02/2021 10:00 AM Vicie Mutters, PA-C [facility] None     HPI  49 y.o. female presents for a complete physical. She has Thyroid disease; Vitamin D deficiency; OAB (overactive bladder); Generalized  anxiety disorder; GERD (gastroesophageal reflux disease); Insomnia; Hyperlipidemia; Obesity (BMI 30.0-34.9); Tachycardia; and Smoker on their problem list.   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   She is following with her OB/GYN, Dr. Melba Coon, for OAB on oxybutynin; pt reports she saw [facility] several years ago who r/o previously suspected interstitial cystitis and she reports has done well since with this medication.   She has had ongoing tachycardia/palpitations and was having dyspnea associated with this and was worked up by cardiology; Holter showed some PVCs/PACs, ECHO from 10/2017 was unremarkable. She is cutting back on caffeine (1-2 per day)/ETOH and trying to cut down on smoking. She is on toprol with improvement in symptoms.   She has been prescribed lexapro 20 mg daily and xanax PRN for anxiety; she currently uses 0.25 mg at night if needed to sleep.   She currently takes omeprazole 20 mg daily for GERD and reports well controlled without breakthrough. Denies cough, hoarseness.   BMI is There is no height or weight on file to calculate BMI., she has been working on diet and exercise. She aims to get 150 min of walking/running weekly.  Wt Readings from Last 3 Encounters:  11/04/19 215 lb (97.5 kg)  08/16/19 220 lb (99.8 kg)  06/21/19 222 lb (100.7 kg)   Her blood pressure has been controlled at home, today their BP is   She does workout. She denies chest pain, shortness of breath, dizziness.   She is on cholesterol medication (newly on rosuvastatin  5 mg once a week) and denies myalgias. Her cholesterol is not at goal. The cholesterol last visit was:   Lab Results  Component Value Date   CHOL 207 (H) 11/04/2019   HDL 65 11/04/2019   LDLCALC 126 (H) 11/04/2019   TRIG 65 11/04/2019   CHOLHDL 3.2 11/04/2019   Last A1C in the office was:   Lab Results  Component Value Date   HGBA1C 5.4 06/21/2019   Patient is  on Vitamin D supplement, 5000 IU daily  Lab Results  Component Value Date   VD25OH 48 06/21/2019   She is on thyroid medication. Her medication was not changed last visit. taking 112 mcg daily on empty stomach.   Lab Results  Component Value Date   TSH 0.67 11/04/2019       Current Medications:    Current Outpatient Medications (Endocrine & Metabolic):  .  levothyroxine (SYNTHROID, LEVOTHROID) 112 MCG tablet, TAKE 1 TABLET BY MOUTH ONCE DAILY BEFORE BREAKFAST.  Current Outpatient Medications (Cardiovascular):  .  metoprolol succinate (TOPROL-XL) 50 MG 24 hr tablet, TAKE 1 TABLET (50 MG TOTAL) BY MOUTH DAILY. TAKE WITH OR IMMEDIATELY FOLLOWING A MEAL.  Current Outpatient Medications (Respiratory):  .  budesonide-formoterol (SYMBICORT) 80-4.5 MCG/ACT inhaler, Inhale 2 puffs daily twice a day, wash your mouth afterwards .  fluticasone (FLONASE) 50 MCG/ACT nasal spray, Place 2 sprays into both nostrils daily. (Patient taking differently: Place 2 sprays into both nostrils as needed. )    Current Outpatient Medications (Other):  Marland Kitchen  ALPRAZolam (XANAX) 0.5 MG tablet, TAKE 1 TABLET BY MOUTH 3 TIMES A DAY AS NEEDED FOR ANXIETY. TRY TO CUT BACK ON HOW MUCH TAKING, NEEDS TO LAST CLOSE TO 3 MONTHS .  Cholecalciferol (VITAMIN D PO), Take 5,000 Units by mouth daily. .  cyclobenzaprine (FLEXERIL) 10 MG tablet, Take 1 tablet (10 mg total) by mouth every 8 (eight) hours as needed for muscle spasms. Marland Kitchen  escitalopram (LEXAPRO) 20 MG tablet, TAKE 1 TABLET BY MOUTH ONCE A DAY FOR MOOD .  omeprazole (PRILOSEC) 20 MG capsule, TAKE 1 CAPSULE (20 MG TOTAL) BY MOUTH DAILY AS NEEDED. Marland Kitchen  oxybutynin (DITROPAN-XL) 10 MG 24 hr tablet, Take 1 tablet Daily for Bladder Control  Health Maintenance:    Immunization History  Administered Date(s) Administered  . Influenza, Seasonal, Injecte, Preservative Fre 02/26/2016  . PFIZER SARS-COV-2 Vaccination 05/09/2019, 05/27/2019  . 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 Pap: Dr. Melba Coon, 09/2015 DUE - patient will schedule MGM: 08/2017 will follow up with GYN DEXA: N/A Colonoscopy: N/A EGD: N/A CXR 08/2013  Last Dental Exam: Dr. Marland Kitchen 2019, q44m Last Eye Exam: [facility], Dr. Alease Medina, 2020, wears glasses  Patient Care Team: Unk Pinto, MD as PCP - General (Internal Medicine) Stanford Breed Denice Bors, MD as PCP - Cardiology (Cardiology)  Medical History:  Past Medical History:  Diagnosis Date  . Anxiety   . Breast hypertrophy 03/2014  . GERD (gastroesophageal reflux disease)   . Hyperlipidemia   . Hypothyroidism   . Interstitial cystitis    Allergies Allergies  Allergen Reactions  . Amoxicillin Rash and Other (See Comments)    FEVER  . Bisoprolol Anxiety  . Metoprolol Tartrate Palpitations  . Verapamil Rash    SURGICAL HISTORY She  has a past surgical history that includes Breast reduction surgery (Bilateral, 04/18/2014) and Breast biopsy (Left). FAMILY HISTORY Her  family history includes CVA (age of onset: 4) in her maternal grandfather; Heart failure in her paternal grandfather; Osteosarcoma (age of onset: 55) in her sister. SOCIAL HISTORY She  reports that she has been smoking cigarettes. She has a 15.00 pack-year smoking history. She has never used smokeless tobacco. She reports current alcohol use of about 10.0 standard drinks of alcohol per week. She reports that she does not use drugs.  Review of Systems: Review of Systems  Constitutional: Negative.  Negative for malaise/fatigue and weight loss.  HENT: Negative.  Negative for hearing loss and tinnitus.   Eyes: Negative.  Negative for blurred vision and double vision.  Respiratory: Negative.  Negative for cough, sputum production, shortness of breath and wheezing.   Cardiovascular: Negative.  Negative for chest pain, palpitations, orthopnea, claudication,  leg swelling and PND.  Gastrointestinal: Negative for abdominal pain, blood in stool, constipation, diarrhea, heartburn, melena, nausea and vomiting.  Genitourinary: Negative for dysuria, flank pain, frequency, hematuria and urgency.  Musculoskeletal: Negative.  Negative for falls, joint pain and myalgias.  Skin: Negative.  Negative for rash.  Neurological: Negative.  Negative for dizziness, tingling, sensory change, weakness and headaches.  Endo/Heme/Allergies: Negative for polydipsia.  Psychiatric/Behavioral: Negative for depression, memory loss, substance abuse and suicidal ideas. The patient is not nervous/anxious and does not have insomnia.   All other systems reviewed and are negative.   Physical Exam: Estimated body mass index is 32.69 kg/m as calculated from the following:   Height as of 08/16/19: 5\' 8"  (1.727 m).   Weight as of 11/04/19: 215 lb (97.5 kg). There were no vitals taken for this visit.  Wt Readings from Last 3 Encounters:  11/04/19 215 lb (97.5 kg)  08/16/19 220 lb (99.8 kg)  06/21/19 222 lb (100.7 kg)   General Appearance: Well nourished, in no apparent distress.  Eyes: PERRLA, EOMs, conjunctiva no swelling or erythema, normal fundi and vessels.  Sinuses: No Frontal/maxillary tenderness  ENT/Mouth: Ext aud canals clear, normal light reflex with TMs without erythema, bulging. Good dentition. No erythema, swelling, or exudate on post pharynx. She has large left tonsil, R side not visible, no erythema, irregular texture or appearance of lesion (chronic and unchanged per patient), Hearing normal.  Neck: Supple, thyroid normal. No bruits  Respiratory: Respiratory effort normal, BS equal bilaterally without rales, rhonchi, wheezing or stridor.  Cardio: RRR without murmurs, rubs or gallops. Brisk peripheral pulses without edema.  Chest: symmetric, with normal excursions and percussion.  Breasts: defer to GYN Abdomen: Soft, nontender, no guarding, rebound, hernias, masses,  or organomegaly.  Lymphatics: Non tender without lymphadenopathy.  Genitourinary: defer to GYN Musculoskeletal: Full ROM all peripheral extremities,5/5 strength, and normal gait.  Skin: Warm, dry without rashes, lesions, ecchymosis. Neuro: Cranial nerves intact, reflexes equal bilaterally. Normal muscle tone, no cerebellar symptoms. Sensation intact.  Psych: Awake and oriented X 3, normal affect, Insight and Judgment appropriate.   EKG: Sinus brady, NSCPT  Vicie Mutters 1:19 PM [facility]

## 2019-12-29 ENCOUNTER — Encounter: Payer: 59 | Admitting: Physician Assistant

## 2019-12-29 ENCOUNTER — Encounter: Admit: 2019-12-29 | Payer: PRIVATE HEALTH INSURANCE | Attending: Obstetrics and Gynecology | Primary: Internal Medicine

## 2020-01-09 ENCOUNTER — Other Ambulatory Visit: Payer: Self-pay | Admitting: Adult Health

## 2020-01-09 ENCOUNTER — Other Ambulatory Visit: Payer: Self-pay | Admitting: Physician Assistant

## 2020-01-09 DIAGNOSIS — K2100 Gastro-esophageal reflux disease with esophagitis, without bleeding: Secondary | ICD-10-CM

## 2020-01-09 MED FILL — METOPROLOL SUCCINATE ER 50: 50 | 90 days supply | Qty: 90 | Fill #1

## 2020-01-09 MED FILL — OXYBUTYNIN CL ER 10 MG TAB: 10 | 90 days supply | Qty: 90 | Fill #3

## 2020-01-09 MED FILL — OMEPRAZOLE DR 20 MG CAPSULE: 20 | 90 days supply | Qty: 90 | Fill #0

## 2020-01-21 MED ORDER — PRAVASTATIN 20 MG TABLET
20 | ORAL_TABLET | Freq: Every evening | ORAL | 3 refills | 90.00000 days | Status: AC
Start: 2020-01-21 — End: 2020-02-21

## 2020-02-08 MED ORDER — METOPROLOL SUCCINATE ER 25 MG TABLET,EXTENDED RELEASE 24 HR
25 | ORAL_TABLET | Freq: Every day | ORAL | 2 refills | 90.00000 days | Status: AC
Start: 2020-02-08 — End: 2020-08-20

## 2020-02-15 NOTE — Progress Notes (Signed)
Complete Physical  Assessment and Plan:  Encounter for general adult medical examination with abnormal findings  Tachycardia Continue to cut down on caffeine, ETOH, smoking Continue toprol; symptoms have improved/resolved Followed by cardiology; titrating BB with them - denies symptoms of bradycardia which is noted on EKG  Gastroesophageal reflux disease with esophagitis Symptoms well managed without breakthrough Will try to get off PPI given info for taper and pepcid sent in  Thyroid disease Hypothyroidism-check TSH level, continue medications the same, reminded to take on an empty stomach 30-6mins before food.  -     TSH  OAB Avoid triggers, continue oxybutynin  Anemia, unspecified type - monitor, continue iron supp with Vitamin C and increase green leafy veggies -     CBC with Differential/Platelet -     Vitamin B12  Vitamin D deficiency -     VITAMIN D 25 Hydroxy (Vit-D Deficiency, Fractures)  Generalized anxiety disorder/insomnia continue medications, stress management techniques discussed, increase water, good sleep hygiene discussed, increase exercise, and increase veggies.   Medication management -     CMP/GFR -     Magnesium  Hyperlipidemia -     Lipid panel  Screening for blood or protein in urine -     Urinalysis, Routine w reflex microscopic -     Microalbumin / creatinine urine ratio  Screening diabetes      -      A1C  Discussed med's effects and SE's. Screening labs and tests as requested with regular follow-up as recommended. Over 40 minutes of exam, counseling, chart review, and critical decision making was performed this visit.   Future Appointments  Date Time Provider [place]  02/18/2021  2:00 PM Garnet Sierras, NP [facility] None     HPI  49 y.o. female presents for a complete physical. She has Thyroid disease; Vitamin D deficiency; OAB (overactive bladder); Generalized anxiety disorder; GERD (gastroesophageal reflux disease);  Insomnia; Hyperlipidemia; Obesity (BMI 30.0-34.9); Tachycardia; and Smoker on their problem list.   She got married in October 2020, husband Gerald Stabs is here, running wedding, nurse at cardiology office. She has 80 year old son.   She is following with her OB/GYN, Dr. Melba Coon, for OAB on oxybutynin; pt reports she saw [facility] several years ago who r/o previously suspected interstitial cystitis and she reports has done well since with this medication.   She has had ongoing tachycardia/palpitations and was having dyspnea associated with this and was worked up by cardiology; Holter showed some PVCs/PACs, ECHO from 10/2017 was unremarkable. She is on toprol with improvement in symptoms.   She tapered off the lexapro and has been doing well. Xanax PRN for anxiety; she currently uses 0.25 mg at night if needed to sleep.   She currently takes omeprazole 20 mg daily for GERD and reports well controlled without breakthrough. Denies cough, hoarseness.   BMI is Body mass index is 31.57 kg/m., she has been working on diet and exercise. She aims to get 150 min of walking/running weekly.  Wt Readings from Last 3 Encounters:  02/16/20 207 lb 9.6 oz (94.2 kg)  11/04/19 215 lb (97.5 kg)  08/16/19 220 lb (99.8 kg)   Her blood pressure has been controlled at home, today their BP is BP: 116/76 She does workout. She denies chest pain, shortness of breath, dizziness.   She is on cholesterol medication, she was on crestor but was having pain so stopped but may retry depending these labs. Her cholesterol is not at goal. The cholesterol last  visit was:   Lab Results  Component Value Date   CHOL 207 (H) 11/04/2019   HDL 65 11/04/2019   LDLCALC 126 (H) 11/04/2019   TRIG 65 11/04/2019   CHOLHDL 3.2 11/04/2019   Last A1C in the office was:   Lab Results  Component Value Date   HGBA1C 5.4 06/21/2019   Patient is on Vitamin D supplement, 5000 IU daily  Lab Results  Component Value Date   VD25OH 48  06/21/2019   She is on thyroid medication. Her medication was not changed last visit. taking 112 mcg daily on empty stomach. Not on a biotin.  Lab Results  Component Value Date   TSH 0.67 11/04/2019     Current Medications:   Current Outpatient Medications (Endocrine & Metabolic):  .  levothyroxine (SYNTHROID, LEVOTHROID) 112 MCG tablet, TAKE 1 TABLET BY MOUTH ONCE DAILY BEFORE BREAKFAST.  Current Outpatient Medications (Cardiovascular):  .  metoprolol succinate (TOPROL-XL) 50 MG 24 hr tablet, TAKE 1 TABLET (50 MG TOTAL) BY MOUTH DAILY. TAKE WITH OR IMMEDIATELY FOLLOWING A MEAL.  Current Outpatient Medications (Respiratory):  .  budesonide-formoterol (SYMBICORT) 80-4.5 MCG/ACT inhaler, Inhale 2 puffs daily twice a day, wash your mouth afterwards .  fluticasone (FLONASE) 50 MCG/ACT nasal spray, Place 2 sprays into both nostrils daily. (Patient taking differently: Place 2 sprays into both nostrils as needed. )    Current Outpatient Medications (Other):  Marland Kitchen  ALPRAZolam (XANAX) 0.5 MG tablet, TAKE 1 TABLET BY MOUTH 3 TIMES A DAY AS NEEDED FOR ANXIETY. TRY TO CUT BACK ON HOW MUCH TAKING, NEEDS TO LAST CLOSE TO 3 MONTHS .  Cholecalciferol (VITAMIN D PO), Take 5,000 Units by mouth daily. .  cyclobenzaprine (FLEXERIL) 10 MG tablet, Take 1 tablet (10 mg total) by mouth every 8 (eight) hours as needed for muscle spasms. Marland Kitchen  omeprazole (PRILOSEC) 20 MG capsule, TAKE 1 CAPSULE (20 MG TOTAL) BY MOUTH DAILY AS NEEDED. Marland Kitchen  oxybutynin (DITROPAN-XL) 10 MG 24 hr tablet, Take 1 tablet Daily for Bladder Control .  escitalopram (LEXAPRO) 20 MG tablet, TAKE 1 TABLET BY MOUTH ONCE A DAY FOR MOOD (Patient not taking: Reported on 02/16/2020)  Health Maintenance:    Immunization History  Administered Date(s) Administered  . Influenza, Seasonal, Injecte, Preservative Fre 02/26/2016  . PFIZER SARS-COV-2 Vaccination 05/09/2019, 05/27/2019  . 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 (Menstrual status: Perimenopausal). Pap: Dr. Melba Coon, 02/16/2020  MGM: 02/16/2020  DEXA: N/A Colonoscopy: N/A will refer next year due to new guidelines FIT negative 10/2019 EGD: N/A CXR 01/2019  Last Dental Exam: Dr.  Derek Jack, q96m Last Eye Exam: [facility] Ser, Dr. Alease Medina, 2020, wears glasses  Patient Care Team: Unk Pinto, MD as PCP - General (Internal Medicine) Stanford Breed Denice Bors, MD as PCP - Cardiology (Cardiology)  Medical History:  Past Medical History:  Diagnosis Date  . Anxiety   . Breast hypertrophy 03/2014  . GERD (gastroesophageal reflux disease)   . Hyperlipidemia   . Hypothyroidism   . Interstitial cystitis    Allergies Allergies  Allergen Reactions  . Amoxicillin Rash and Other (See Comments)    FEVER  . Bisoprolol Anxiety  . Metoprolol Tartrate Palpitations  . Verapamil Rash    SURGICAL HISTORY She  has a past surgical history that includes Breast reduction surgery (Bilateral, 04/18/2014) and Breast biopsy (Left). FAMILY HISTORY  Her family history includes CVA (age of onset: 50) in her maternal grandfather; Heart failure in her paternal grandfather; Osteosarcoma (age of onset: 81) in her sister. SOCIAL HISTORY She  reports that she has been smoking cigarettes. She has a 15.00 pack-year smoking history. She has never used smokeless tobacco. She reports current alcohol use of about 10.0 standard drinks of alcohol per week. She reports that she does not use drugs.  Review of Systems: Review of Systems  Constitutional: Negative.  Negative for malaise/fatigue and weight loss.  HENT: Negative.  Negative for hearing loss and tinnitus.   Eyes: Negative.  Negative for blurred vision and double vision.  Respiratory: Negative.  Negative for cough, sputum production, shortness of breath and wheezing.   Cardiovascular: Negative.  Negative for  chest pain, palpitations, orthopnea, claudication, leg swelling and PND.  Gastrointestinal: Negative for abdominal pain, blood in stool, constipation, diarrhea, heartburn, melena, nausea and vomiting.  Genitourinary: Negative for dysuria, flank pain, frequency, hematuria and urgency.  Musculoskeletal: Negative.  Negative for falls, joint pain and myalgias.  Skin: Negative.  Negative for rash.  Neurological: Negative.  Negative for dizziness, tingling, sensory change, weakness and headaches.  Endo/Heme/Allergies: Negative for polydipsia.  Psychiatric/Behavioral: Negative for depression, memory loss, substance abuse and suicidal ideas. The patient is not nervous/anxious and does not have insomnia.   All other systems reviewed and are negative.   Physical Exam: Estimated body mass index is 31.57 kg/m as calculated from the following:   Height as of this encounter: 5\' 8"  (1.727 m).   Weight as of this encounter: 207 lb 9.6 oz (94.2 kg). BP 116/76   Pulse (!) 108   Temp (!) 97.3 F (36.3 C)   Ht 5\' 8"  (1.727 m)   Wt 207 lb 9.6 oz (94.2 kg)   SpO2 95%   BMI 31.57 kg/m   Wt Readings from Last 3 Encounters:  02/16/20 207 lb 9.6 oz (94.2 kg)  11/04/19 215 lb (97.5 kg)  08/16/19 220 lb (99.8 kg)   General Appearance: Well nourished, in no apparent distress.  Eyes: PERRLA, EOMs, conjunctiva no swelling or erythema, normal fundi and vessels.  Sinuses: No Frontal/maxillary tenderness  ENT/Mouth: Ext aud canals clear, normal light reflex with TMs without erythema, bulging. Good dentition. No erythema, swelling, or exudate on post pharynx. She has large left tonsil, R side not visible, no erythema, irregular texture or appearance of lesion (chronic and unchanged per patient), Hearing normal.  Neck: Supple, thyroid normal. No bruits  Respiratory: Respiratory effort normal, BS equal bilaterally without rales, rhonchi, wheezing or stridor.  Cardio: RRR without murmurs, rubs or gallops. Brisk  peripheral pulses without edema.  Chest: symmetric, with normal excursions and percussion.  Breasts: defer to GYN Abdomen: Soft, nontender, no guarding, rebound, hernias, masses, or organomegaly.  Lymphatics: Non tender without lymphadenopathy.  Genitourinary: defer to GYN Musculoskeletal: Full ROM all peripheral extremities,5/5 strength, and normal gait.  Skin: Warm, dry without rashes, lesions, ecchymosis. Neuro: Cranial nerves intact, reflexes equal bilaterally. Normal muscle tone, no cerebellar symptoms. Sensation intact.  Psych: Awake and oriented X 3, normal affect, Insight and Judgment appropriate.   EKG: Sinus brady, NSCPT  Vicie Mutters 2:14 PM [facility]

## 2020-02-16 ENCOUNTER — Encounter: Payer: Self-pay | Admitting: Physician Assistant

## 2020-02-16 ENCOUNTER — Ambulatory Visit: Payer: 59 | Admitting: Physician Assistant

## 2020-02-16 ENCOUNTER — Other Ambulatory Visit: Payer: Self-pay

## 2020-02-16 ENCOUNTER — Inpatient Hospital Stay: Admit: 2020-02-16 | Discharge: 2020-02-16 | Payer: PRIVATE HEALTH INSURANCE | Primary: Internal Medicine

## 2020-02-16 VITALS — BP 116/76 | HR 108 | Temp 97.3°F | Ht 68.0 in | Wt 207.6 lb

## 2020-02-16 DIAGNOSIS — F5105 Insomnia due to other mental disorder: Secondary | ICD-10-CM

## 2020-02-16 DIAGNOSIS — Z0000 Encounter for general adult medical examination without abnormal findings: Secondary | ICD-10-CM

## 2020-02-16 DIAGNOSIS — K2100 Gastro-esophageal reflux disease with esophagitis, without bleeding: Secondary | ICD-10-CM

## 2020-02-16 DIAGNOSIS — F17200 Nicotine dependence, unspecified, uncomplicated: Secondary | ICD-10-CM

## 2020-02-16 DIAGNOSIS — E079 Disorder of thyroid, unspecified: Secondary | ICD-10-CM

## 2020-02-16 DIAGNOSIS — E559 Vitamin D deficiency, unspecified: Secondary | ICD-10-CM | POA: Diagnosis not present

## 2020-02-16 DIAGNOSIS — Z0001 Encounter for general adult medical examination with abnormal findings: Secondary | ICD-10-CM

## 2020-02-16 DIAGNOSIS — Z1231 Encounter for screening mammogram for malignant neoplasm of breast: Secondary | ICD-10-CM | POA: Diagnosis not present

## 2020-02-16 DIAGNOSIS — E669 Obesity, unspecified: Secondary | ICD-10-CM

## 2020-02-16 DIAGNOSIS — Z79899 Other long term (current) drug therapy: Secondary | ICD-10-CM

## 2020-02-16 DIAGNOSIS — L905 Scar conditions and fibrosis of skin: Secondary | ICD-10-CM

## 2020-02-16 DIAGNOSIS — N3281 Overactive bladder: Secondary | ICD-10-CM

## 2020-02-16 DIAGNOSIS — R000 Tachycardia, unspecified: Secondary | ICD-10-CM

## 2020-02-16 DIAGNOSIS — Z1389 Encounter for screening for other disorder: Secondary | ICD-10-CM | POA: Diagnosis not present

## 2020-02-16 DIAGNOSIS — F411 Generalized anxiety disorder: Secondary | ICD-10-CM

## 2020-02-16 DIAGNOSIS — Z01419 Encounter for gynecological examination (general) (routine) without abnormal findings: Secondary | ICD-10-CM | POA: Diagnosis not present

## 2020-02-16 DIAGNOSIS — E782 Mixed hyperlipidemia: Secondary | ICD-10-CM | POA: Diagnosis not present

## 2020-02-16 DIAGNOSIS — Z1151 Encounter for screening for human papillomavirus (HPV): Secondary | ICD-10-CM | POA: Diagnosis not present

## 2020-02-16 DIAGNOSIS — E039 Hypothyroidism, unspecified: Secondary | ICD-10-CM | POA: Diagnosis not present

## 2020-02-16 DIAGNOSIS — Z6831 Body mass index (BMI) 31.0-31.9, adult: Secondary | ICD-10-CM | POA: Diagnosis not present

## 2020-02-16 DIAGNOSIS — Z124 Encounter for screening for malignant neoplasm of cervix: Secondary | ICD-10-CM | POA: Diagnosis not present

## 2020-02-16 DIAGNOSIS — Z20828 Contact with and (suspected) exposure to other viral communicable diseases: Secondary | ICD-10-CM

## 2020-02-16 DIAGNOSIS — Z20822 Contact with and (suspected) exposure to covid-19: Secondary | ICD-10-CM

## 2020-02-16 NOTE — Patient Instructions (Signed)
Silent reflux: Not all heartburn burns...Marland KitchenMarland KitchenMarland Kitchen  What is LPR? Laryngopharyngeal reflux (LPR) or silent reflux is a condition in which acid that is made in the stomach travels up the esophagus (swallowing tube) and gets to the throat. Not everyone with reflux has a lot of heartburn or indigestion. In fact, many people with LPR never have heartburn. This is why LPR is called SILENT REFLUX, and the terms "Silent reflux" and "LPR" are often used interchangeably. Because LPR is silent, it is sometimes difficult to diagnose.  How can you tell if you have LPR?  Marland Kitchen Chronic hoarseness- Some people have hoarseness that comes and goes . throat clearing  . Cough . It can cause shortness of breath and cause asthma like symptoms. Marland Kitchen a feeling of a lump in the throat  . difficulty swallowing . a problem with too much nose and throat drainage.  . Some people will feel their esophagus spasm which feels like their heart beating hard and fast, this will usually be after a meal, at rest, or lying down at night.    How do I treat this? Treatment for LPR should be individualized, and your doctor will suggest the best treatment for you. Generally there are several treatments for LPR: . changing habits and diet to reduce reflux,  . medications to reduce stomach acid, and  . surgery to prevent reflux. Most people with LPR need to modify how and when they eat, as well as take some medication, to get well. Sometimes, nonprescription liquid antacids, such as Maalox, Gelucil and Mylanta are recommended. When used, these antacids should be taken four times each day - one tablespoon one hour after each meal and before bedtime. Dietary and lifestyle changes alone are not often enough to control LPR - medications that reduce stomach acid are also usually needed. These must be prescribed by our doctor.   TIPS FOR REDUCING REFLUX AND LPR Control your LIFE-STYLE and your DIET! Marland Kitchen If you use tobacco, QUIT.  Marland Kitchen Smoking makes you  reflux. After every cigarette you have some LPR.  . Don't wear clothing that is too tight, especially around the waist (trousers, corsets, belts).  . Do not lie down just after eating...in fact, do not eat within three hours of bedtime.  . You should be on a low-fat diet.  . Limit your intake of red meat.  . Limit your intake of butter.  Marland Kitchen Avoid fried foods.  . Avoid chocolate  . Avoid cheese.  Marland Kitchen Avoid eggs. Marland Kitchen Specifically avoid caffeine (especially coffee and tea), soda pop (especially cola) and mints.  . Avoid alcoholic beverages, particularly in the evening.  VARICOSE VEINS Varicose veins are veins that have become enlarged and twisted. CAUSES This condition is the result of valves in the veins not working properly. Valves in the veins help return blood from the leg to the heart. When your calf muscles squeeze, the blood moves up your leg then the valves close and this continues until the blood gets back to your heart.  If these valves are damaged, blood flows backwards and backs up into the veins in the leg near the skin OR if your are sitting/standing for a long time without using your calf muscles the blood will back up into the veins in your legs. This causes the veins to become larger. People who are on their feet a lot, sit a lot without walking (like on a plane, at a desk, or in a car), who are pregnant, or who are  overweight are more likely to develop varicose veins. SYMPTOMS   Bulging, twisted-appearing, bluish veins, most commonly found on the legs.  Leg pain or a feeling of heaviness. These symptoms may be worse at the end of the day.  Leg swelling.  Skin color changes. DIAGNOSIS  Varicose veins can usually be diagnosed with an exam of your legs by your caregiver. He or she may recommend an ultrasound of your leg veins. TREATMENT  Most varicose veins can be treated at home. However, other treatments are available for people who have persistent symptoms or who want to treat  the cosmetic appearance of the varicose veins. But this is only cosmetic and they will return if not properly treated. These include:  Laser treatment of very small varicose veins.  Medicine that is shot (injected) into the vein. This medicine hardens the walls of the vein and closes off the vein. This treatment is called sclerotherapy. Afterwards, you may need to wear clothing or bandages that apply pressure.  Surgery. HOME CARE INSTRUCTIONS   Do not stand or sit in one position for long periods of time. Do not sit with your legs crossed. Rest with your legs raised during the day.  Your legs have to be higher than your heart so that gravity will force the valves to open, so please really elevate your legs.   Wear elastic stockings or support hose. Do not wear other tight, encircling garments around the legs, pelvis, or waist.  [employer]  has a wide variety of well priced compression stockings. [place], [place] [id number] [phone]  OR THERE ARE COPPER INFUSED COMPRESSION SOCKS AT [facility] OR [facility]  [employer] also has great cheap/afforable stockings or socks- the socks are easier to get on your feet  - can also get a jacob's donner that helps you put on the sock  Walk as much as possible to increase blood flow.  Raise the foot of your bed at night with 2-inch blocks.  If you get a cut in the skin over the vein and the vein bleeds, lie down with your leg raised and press on it with a clean cloth until the bleeding stops. Then place a bandage (dressing) on the cut. See your caregiver if it continues to bleed or needs stitches. SEEK MEDICAL CARE IF:   The skin around your ankle starts to break down.  You have pain, redness, tenderness, or hard swelling developing in your leg over a vein.  You are uncomfortable due to leg pain. Document Released: 02/12/2005 Document Revised: 07/28/2011 Document Reviewed: 07/01/2010 [facility] Patient Information 2014 [place].   General eating tips  What to Avoid . Avoid added sugars o Often added sugar can be found in processed foods such as many condiments, dry cereals, cakes, cookies, chips, crisps, crackers, candies, sweetened drinks, etc.  o Read labels and AVOID/DECREASE use of foods with the following in their ingredient list: Sugar, fructose, high fructose corn syrup, sucrose, glucose, maltose, dextrose, molasses, cane sugar, brown sugar, any type of syrup, agave nectar, etc.   . Avoid snacking in between meals- drink water or if you feel you need a snack, pick a high water content snack such as cucumbers, watermelon, or any veggie.  Marland Kitchen Avoid foods made with flour o If you are going to eat food made with flour, choose those made with whole-grains; and, minimize your consumption as much as is tolerable . Avoid processed foods o These foods are generally stocked in  the middle of the grocery store.  o Focus on shopping on the perimeter of the grocery.  What to Include . Vegetables o GREEN LEAFY VEGETABLES: Kale, spinach, mustard greens, collard greens, cabbage, broccoli, etc. o OTHER: Asparagus, cauliflower, eggplant, carrots, peas, Brussel sprouts, tomatoes, bell peppers, zucchini, beets, cucumbers, etc. . Grains, seeds, and legumes o Beans: kidney beans, black eyed peas, garbanzo beans, black beans, pinto beans, etc. o Whole, unrefined grains: brown rice, barley, bulgur, oatmeal, etc. . Healthy fats  o Avoid highly processed fats such as vegetable oil o Examples of healthy fats: avocado, olives, virgin olive oil, dark chocolate (?72% Cocoa), nuts (peanuts, almonds, walnuts, cashews, pecans, etc.) o Please still do small amount of these healthy fats, they are dense in calories.  . Low - Moderate Intake of Animal Sources of Protein o Meat sources: chicken, [place], salmon, tuna. Limit to 4 ounces of meat at one time or the size of your palm. o Consider limiting dairy sources, but when choosing dairy  focus on: PLAIN [place] yogurt, cottage cheese, high-protein milk . Fruit o Choose berries

## 2020-02-17 LAB — URINALYSIS, ROUTINE W REFLEX MICROSCOPIC
Bilirubin Urine: NEGATIVE
Glucose, UA: NEGATIVE
Hgb urine dipstick: NEGATIVE
Ketones, ur: NEGATIVE
Leukocytes,Ua: NEGATIVE
Nitrite: NEGATIVE
Protein, ur: NEGATIVE
Specific Gravity, Urine: 1.01 (ref 1.001–1.03)
pH: >=8.5 — AB (ref 5.0–8.0)

## 2020-02-17 LAB — CBC WITH DIFFERENTIAL/PLATELET
Absolute Monocytes: 720 cells/uL (ref 200–950)
Basophils Absolute: 53 cells/uL (ref 0–200)
Basophils Relative: 0.7 %
Eosinophils Absolute: 113 cells/uL (ref 15–500)
Eosinophils Relative: 1.5 %
HCT: 43.1 % (ref 35.0–45.0)
Hemoglobin: 14.5 g/dL (ref 11.7–15.5)
Lymphs Abs: 1440 cells/uL (ref 850–3900)
MCH: 29.8 pg (ref 27.0–33.0)
MCHC: 33.6 g/dL (ref 32.0–36.0)
MCV: 88.7 fL (ref 80.0–100.0)
MPV: 11.9 fL (ref 7.5–12.5)
Monocytes Relative: 9.6 %
Neutro Abs: 5175 cells/uL (ref 1500–7800)
Neutrophils Relative %: 69 %
Platelets: 291 10*3/uL (ref 140–400)
RBC: 4.86 10*6/uL (ref 3.80–5.10)
RDW: 12.5 % (ref 11.0–15.0)
Total Lymphocyte: 19.2 %
WBC: 7.5 10*3/uL (ref 3.8–10.8)

## 2020-02-17 LAB — LIPID PANEL
Cholesterol: 226 mg/dL — ABNORMAL HIGH (ref <200)
HDL: 63 mg/dL (ref >=50)
LDL Cholesterol (Calc): 138 mg/dL (calc) — ABNORMAL HIGH
Non-HDL Cholesterol (Calc): 163 mg/dL (calc) — ABNORMAL HIGH (ref <130)
Total CHOL/HDL Ratio: 3.6 (calc) (ref <5.0)
Triglycerides: 124 mg/dL (ref <150)

## 2020-02-17 LAB — TSH: TSH: 0.41 mIU/L

## 2020-02-17 LAB — MICROALBUMIN / CREATININE URINE RATIO
Creatinine, Urine: 46 mg/dL (ref 20–275)
Microalb Creat Ratio: 4 mcg/mg creat (ref <30)
Microalb, Ur: 0.2 mg/dL

## 2020-02-17 LAB — COMPLETE METABOLIC PANEL WITH GFR
AG Ratio: 1.5 (calc) (ref 1.0–2.5)
ALT: 25 U/L (ref 6–29)
AST: 24 U/L (ref 10–35)
Albumin: 4.3 g/dL (ref 3.6–5.1)
Alkaline phosphatase (APISO): 70 U/L (ref 31–125)
BUN: 13 mg/dL (ref 7–25)
CO2: 29 mmol/L (ref 20–32)
Calcium: 9.7 mg/dL (ref 8.6–10.2)
Chloride: 104 mmol/L (ref 98–110)
Creat: 0.88 mg/dL (ref 0.50–1.10)
GFR, Est African American: 91 mL/min/{1.73_m2} (ref >=60)
GFR, Est Non African American: 79 mL/min/{1.73_m2} (ref >=60)
Globulin: 2.8 g/dL (calc) (ref 1.9–3.7)
Glucose, Bld: 104 mg/dL — ABNORMAL HIGH (ref 65–99)
Potassium: 3.9 mmol/L (ref 3.5–5.3)
Sodium: 141 mmol/L (ref 135–146)
Total Bilirubin: 0.4 mg/dL (ref 0.2–1.2)
Total Protein: 7.1 g/dL (ref 6.1–8.1)

## 2020-02-17 LAB — VITAMIN D 25 HYDROXY (VIT D DEFICIENCY, FRACTURES): Vit D, 25-Hydroxy: 57 ng/mL (ref 30–100)

## 2020-02-17 LAB — MAGNESIUM: Magnesium: 2.3 mg/dL (ref 1.5–2.5)

## 2020-02-17 LAB — COVID-19 CLEARANCE OR FOR PLACEMENT ONLY: BKR SARS-COV-2 RNA (COVID-19) (YH): NOT DETECTED

## 2020-02-20 MED FILL — LEVOTHYROXINE SODIUM 112 MC: 112 | 90 days supply | Qty: 90 | Fill #2

## 2020-02-21 MED ORDER — PRAVASTATIN 20 MG TABLET
20 | ORAL_TABLET | Freq: Every evening | ORAL | 3 refills | 90.00000 days | Status: AC
Start: 2020-02-21 — End: 2020-05-23

## 2020-02-23 ENCOUNTER — Other Ambulatory Visit: Payer: Self-pay | Admitting: Physician Assistant

## 2020-02-23 ENCOUNTER — Other Ambulatory Visit: Payer: Self-pay | Admitting: Obstetrics and Gynecology

## 2020-02-23 DIAGNOSIS — R928 Other abnormal and inconclusive findings on diagnostic imaging of breast: Secondary | ICD-10-CM

## 2020-02-23 MED ORDER — ESCITALOPRAM OXALATE 10 MG PO TABS: 10.0000 mg | ORAL_TABLET | Freq: Every day | ORAL | 0 refills | Status: DC

## 2020-02-23 MED FILL — ESCITALOPRAM 10 MG TABLET: 10 | 90 days supply | Qty: 90 | Fill #0

## 2020-03-12 ENCOUNTER — Other Ambulatory Visit: Payer: 59

## 2020-03-16 DIAGNOSIS — R87629 Unspecified abnormal cytological findings in specimens from vagina: Secondary | ICD-10-CM | POA: Diagnosis not present

## 2020-03-16 DIAGNOSIS — Z3202 Encounter for pregnancy test, result negative: Secondary | ICD-10-CM | POA: Diagnosis not present

## 2020-03-16 DIAGNOSIS — N8500 Endometrial hyperplasia, unspecified: Secondary | ICD-10-CM | POA: Diagnosis not present

## 2020-04-03 MED FILL — OMEPRAZOLE DR 20 MG CAPSULE: 20 | 90 days supply | Qty: 90 | Fill #1

## 2020-04-11 DIAGNOSIS — R928 Other abnormal and inconclusive findings on diagnostic imaging of breast: Secondary | ICD-10-CM | POA: Diagnosis not present

## 2020-04-26 DIAGNOSIS — R928 Other abnormal and inconclusive findings on diagnostic imaging of breast: Secondary | ICD-10-CM | POA: Diagnosis not present

## 2020-04-30 ENCOUNTER — Other Ambulatory Visit: Payer: Self-pay | Admitting: Adult Health

## 2020-04-30 ENCOUNTER — Other Ambulatory Visit: Payer: Self-pay | Admitting: Internal Medicine

## 2020-04-30 MED FILL — OXYBUTYNIN CL ER 10 MG TAB: 10 | 90 days supply | Qty: 90 | Fill #0

## 2020-05-03 ENCOUNTER — Encounter: Payer: Self-pay | Admitting: Internal Medicine

## 2020-05-03 MED FILL — ESCITALOPRAM 10 MG TABLET: 10 | 90 days supply | Qty: 90 | Fill #0

## 2020-05-21 ENCOUNTER — Other Ambulatory Visit: Payer: Self-pay | Admitting: Internal Medicine

## 2020-05-21 ENCOUNTER — Other Ambulatory Visit: Payer: Self-pay | Admitting: Adult Health

## 2020-05-21 MED FILL — LEVOTHYROXINE SODIUM 112 MC: 112 | 90 days supply | Qty: 90 | Fill #0

## 2020-05-21 MED FILL — METOPROLOL SUCCINATE ER 50: 50 | 90 days supply | Qty: 90 | Fill #2

## 2020-05-23 MED ORDER — PRAVASTATIN 20 MG TABLET
20 | ORAL_TABLET | Freq: Every evening | ORAL | 1 refills | 90.00000 days | Status: AC
Start: 2020-05-23 — End: 2020-08-24

## 2020-07-02 ENCOUNTER — Other Ambulatory Visit: Payer: Self-pay | Admitting: Adult Health

## 2020-07-02 DIAGNOSIS — K2100 Gastro-esophageal reflux disease with esophagitis, without bleeding: Secondary | ICD-10-CM

## 2020-07-02 MED ORDER — ALPRAZOLAM 0.5 MG PO TABS: ORAL_TABLET | ORAL | 0 refills | Status: DC

## 2020-07-02 MED FILL — OMEPRAZOLE DR 20 MG CAPSULE: 20 | 90 days supply | Qty: 90 | Fill #0

## 2020-07-02 MED FILL — ALPRAZolam 0.5 MG TABS: 0.5 | 30 days supply | Qty: 60 | Fill #0

## 2020-07-02 NOTE — Progress Notes (Signed)
Future Appointments  Date Time Provider [place]  08/09/2020  3:00 PM Liane Comber, NP [facility] None  02/18/2021  2:00 PM Garnet Sierras, NP [facility] None   PDMP reviewed for xanax refill request.

## 2020-08-03 ENCOUNTER — Encounter: Payer: Self-pay | Admitting: Adult Health

## 2020-08-03 DIAGNOSIS — M5136 Other intervertebral disc degeneration, lumbar region: Secondary | ICD-10-CM | POA: Insufficient documentation

## 2020-08-06 ENCOUNTER — Ambulatory Visit: Payer: 59 | Admitting: Adult Health

## 2020-08-09 ENCOUNTER — Encounter: Payer: Self-pay | Admitting: Adult Health

## 2020-08-09 ENCOUNTER — Other Ambulatory Visit: Payer: Self-pay

## 2020-08-09 ENCOUNTER — Ambulatory Visit: Payer: 59 | Admitting: Adult Health

## 2020-08-09 VITALS — BP 116/76 | HR 82 | Temp 97.7°F | Wt 200.8 lb

## 2020-08-09 DIAGNOSIS — F17200 Nicotine dependence, unspecified, uncomplicated: Secondary | ICD-10-CM

## 2020-08-09 DIAGNOSIS — E079 Disorder of thyroid, unspecified: Secondary | ICD-10-CM

## 2020-08-09 DIAGNOSIS — M545 Low back pain, unspecified: Secondary | ICD-10-CM

## 2020-08-09 DIAGNOSIS — K2100 Gastro-esophageal reflux disease with esophagitis, without bleeding: Secondary | ICD-10-CM

## 2020-08-09 DIAGNOSIS — E559 Vitamin D deficiency, unspecified: Secondary | ICD-10-CM | POA: Diagnosis not present

## 2020-08-09 DIAGNOSIS — E669 Obesity, unspecified: Secondary | ICD-10-CM

## 2020-08-09 DIAGNOSIS — Z79899 Other long term (current) drug therapy: Secondary | ICD-10-CM | POA: Diagnosis not present

## 2020-08-09 DIAGNOSIS — E782 Mixed hyperlipidemia: Secondary | ICD-10-CM

## 2020-08-09 DIAGNOSIS — F5105 Insomnia due to other mental disorder: Secondary | ICD-10-CM | POA: Diagnosis not present

## 2020-08-09 DIAGNOSIS — F411 Generalized anxiety disorder: Secondary | ICD-10-CM | POA: Diagnosis not present

## 2020-08-09 DIAGNOSIS — F99 Mental disorder, not otherwise specified: Secondary | ICD-10-CM

## 2020-08-09 DIAGNOSIS — M5416 Radiculopathy, lumbar region: Secondary | ICD-10-CM

## 2020-08-09 NOTE — Progress Notes (Signed)
FOLLOW UP  Assessment and Plan:   Tachycardia Continue to cut down on caffeine, ETOH, smoking Continue toprol; symptoms have improved/resolved  Gastroesophageal reflux disease with esophagitis Continue PPI/H2 blocker, diet discussed  Thyroid disease Hypothyroidism-check TSH level, continue medications the same, reminded to take on an empty stomach 30-64mins before food.  -     TSH  Interstitial cystitis Avoid triggers, doing well with oxybutynin  Vitamin D deficiency Near goal at recent check; continue to recommend supplementation for goal of 70-100 Defer vitamin D level  Generalized anxiety disorder/insomnia continue medications, stress management techniques discussed, increase water, good sleep hygiene discussed, increase exercise, and increase veggies.  Medication management -     CMP/GFR -     Magnesium  Hyperlipidemia Working on lifestyle modification, did not tolerate even low dose of crestor, will try low dose of lipitor Discussed with patient and she is in agreement Continue low cholesterol diet and exercise.  Check lipid panel.  -     Lipid panel  Tobacco use Discussed risks associated with tobacco use and advised to reduce or quit Patient is ready to do so and plans to taper and utilize chantix script she has at home; strategies discussed at length; encouraged to commit to quit by date but not ready at this time, "within a few months" Will follow up at the next visit  Obesity Long discussion about weight loss, diet, and exercise Recommended diet heavy in fruits and veggies and low in animal meats, cheeses, and dairy products, appropriate calorie intake Discussed appropriate weight for height  Follow up at next visit  Chronic lumbar pain with RLE radiculopathy Chronic persistent pain with intermittent RLE radicular sx; no red flags Was advised MRI by Dr. Rita Ohara several years back with cost barrier Interested in pursuing today; order placed Did benefit  from PT/dry needling; if MRI doesn't clearly indicate need for neurosurgery will plan to refer back to [facility]   Continue diet and meds as discussed. Further disposition pending results of labs. Discussed med's effects and SE's.   Over 30 minutes of exam, counseling, chart review, and critical decision making was performed.   Future Appointments  Date Time Provider [place]  02/18/2021  2:00 PM Shari Sierras, Shari Payne [id number] None    ----------------------------------------------------------------------------------------------------------------------  HPI 50 y.o. female  presents for follow up on anxiety/insomnia, tachycardia, cholesterol, glucose management, weight and vitamin D deficiency.   She reports long history of lower back pain, hx of sciatica and has seen Dr. Rita Ohara 4-5 years ago, had steroid injection without improvement; was recommended MRI but $750 copay, couldn't afford, was advised weight reduction and told nothing else he could recommend. has been managing with ibuprofen 800 mg in the AM when at work for several years but now having more pain, with catching sensation in lower right back with brief radicular/"electric" pain. She is interested in pursuing MRI.   She saw Dr. McDiarmid for ICS, is now on oxybutynin and reports nearly fully resolved symptoms on med.   Had palpitations,  Holter showed some PVCs/PACs, ECHO from 10/2017 was unremarkable. She is on toprol XL with improvement in symptoms.   She is smoker, since she was 49 y/o, has been reducing, down from1 pack/day to <0.5 pack/day, has chantix, planning to quit soon.   She has had some GERD, states prilosec 20 mg daily, chronic sore throat has resolved. Pepcid didn't help, didn't tolerate attempts to taper.   She has been prescribed xanax PRN for anxiety; she currently uses  0.25 mg at night if needed to sleep, has been reducing. She uses lexapro 10 mg daily.   Recently had abnormal breast screening  03/2020, had [place] 04/26/2021 showed possible fat necrosis, was recommended biopsy but preferred to avoid, planning 6 month follow up US.   BMI is Body mass index is 30.53 kg/m., she has been working on diet and exercise, doing intermittent fasting, 8:16.  Wt Readings from Last 3 Encounters:  08/09/20 200 lb 12.8 oz (91.1 kg)  02/16/20 207 lb 9.6 oz (94.2 kg)  11/04/19 215 lb (97.5 kg)   Her blood pressure has been controlled at home, today their BP is BP: 116/76  She does workout. She denies chest pain, shortness of breath, dizziness.    She is not on cholesterol medication. Her cholesterol is not at goal. The cholesterol last visit was:   Lab Results  Component Value Date   CHOL 226 (H) 02/16/2020   HDL 63 02/16/2020   LDLCALC 138 (H) 02/16/2020   TRIG 124 02/16/2020   CHOLHDL 3.6 02/16/2020   Last A1C in the office was:  Lab Results  Component Value Date   HGBA1C 5.4 06/21/2019   She is on thyroid medication. Her medication was not changed last visit.   Lab Results  Component Value Date   TSH 0.41 02/16/2020   Patient is on Vitamin D supplement.   Lab Results  Component Value Date   VD25OH 57 02/16/2020       Current Medications:   Current Outpatient Medications (Endocrine & Metabolic):  .  levothyroxine (SYNTHROID) 112 MCG tablet, Take      1 tablet       Daily       on an empty stomach with only water for 30 minutes & no Antacid meds, Calcium or Magnesium for 4 hours & avoid Biotin  Current Outpatient Medications (Cardiovascular):  .  metoprolol succinate (TOPROL-XL) 50 MG 24 hr tablet, TAKE 1 TABLET (50 MG TOTAL) BY MOUTH DAILY. TAKE WITH OR IMMEDIATELY FOLLOWING A MEAL.  Current Outpatient Medications (Respiratory):  .  budesonide-formoterol (SYMBICORT) 80-4.5 MCG/ACT inhaler, Inhale 2 puffs daily twice a day, wash your mouth afterwards .  fluticasone (FLONASE) 50 MCG/ACT nasal spray, Place 2 sprays into both nostrils daily. (Patient taking differently: Place 2  sprays into both nostrils as needed.)    Current Outpatient Medications (Other):  Marland Kitchen  ALPRAZolam (XANAX) 0.5 MG tablet, TAKE 1/2-1 TABLET BY MOUTH UP TO 3 TIMES A DAY AS NEEDED FOR SEVERE ANXIETY OR PANIC. AVOID TAKING DAILY DUE TO RISK OF TOLERANCE AND ADDICTION. SHOULD LAST MUCH LONGER THAN 30 DAYS. Marland Kitchen  Cholecalciferol (VITAMIN D PO), Take 5,000 Units by mouth daily. .  cyclobenzaprine (FLEXERIL) 10 MG tablet, Take 1 tablet (10 mg total) by mouth every 8 (eight) hours as needed for muscle spasms. Marland Kitchen  escitalopram (LEXAPRO) 10 MG tablet, Take 1 tablet (10 mg total) by mouth daily. Marland Kitchen  omeprazole (PRILOSEC) 20 MG capsule, TAKE 1 CAPSULE (20 MG TOTAL) BY MOUTH DAILY AS NEEDED. Marland Kitchen  oxybutynin (DITROPAN-XL) 10 MG 24 hr tablet, TAKE 1 TABLET BY MOUTH DAILY FOR BLADDER CONTROL   Allergies:  Allergies  Allergen Reactions  . Amoxicillin Rash and Other (See Comments)    FEVER  . Bisoprolol Anxiety  . Metoprolol Tartrate Palpitations  . Verapamil Rash     Medical History:  Past Medical History:  Diagnosis Date  . Anxiety   . Breast hypertrophy 03/2014  . GERD (gastroesophageal reflux disease)   .  Hyperlipidemia   . Hypothyroidism   . Interstitial cystitis    Family history- Reviewed and unchanged Social history- Reviewed and unchanged   Review of Systems:  Review of Systems  Constitutional: Negative for malaise/fatigue and weight loss.  HENT: Negative for hearing loss and tinnitus.   Eyes: Negative for blurred vision and double vision.  Respiratory: Negative for cough, shortness of breath and wheezing.   Cardiovascular: Negative for chest pain, palpitations, orthopnea, claudication and leg swelling.  Gastrointestinal: Negative for abdominal pain, blood in stool, constipation, diarrhea, heartburn, melena, nausea and vomiting.  Genitourinary: Negative.   Musculoskeletal: Positive for back pain (chronic lumbar, worse with sitting to standing, intermittent R radicular pain). Negative for  joint pain and myalgias.  Skin: Negative for rash.  Neurological: Negative for dizziness, tingling, sensory change, weakness and headaches.  Endo/Heme/Allergies: Negative for polydipsia.  Psychiatric/Behavioral: The patient is nervous/anxious and has insomnia.   All other systems reviewed and are negative.    Physical Exam: BP 116/76   Pulse 82   Temp 97.7 F (36.5 C)   Wt 200 lb 12.8 oz (91.1 kg)   SpO2 99%   BMI 30.53 kg/m  Wt Readings from Last 3 Encounters:  08/09/20 200 lb 12.8 oz (91.1 kg)  02/16/20 207 lb 9.6 oz (94.2 kg)  11/04/19 215 lb (97.5 kg)   General Appearance: Well nourished, in no apparent distress. Eyes: PERRLA, EOMs, conjunctiva no swelling or erythema Sinuses: No Frontal/maxillary tenderness ENT/Mouth: Ext aud canals clear, TMs without erythema, bulging. No erythema, swelling, or exudate on post pharynx.  Tonsils not swollen or erythematous. Hearing normal.  Neck: Supple, thyroid normal.  Respiratory: Respiratory effort normal, BS equal bilaterally without rales, rhonchi, wheezing or stridor.  Cardio: RRR with no MRGs. Brisk peripheral pulses without edema.  Abdomen: Soft, + BS.  Non tender, no guarding, rebound, hernias, masses. Lymphatics: Non tender without lymphadenopathy.  Musculoskeletal: Full ROM, 5/5 strength, Normal gait. No midline lumbar tenderness, mild R SI tenderness, + R straight leg raise Skin: Warm, dry without rashes, lesions, ecchymosis.  Neuro: Cranial nerves intact. No cerebellar symptoms. BLE sensation intact Psych: Awake and oriented X 3, normal affect, Insight and Judgment appropriate.    Shari Ribas, Shari Payne 3:39 PM [facility]

## 2020-08-14 ENCOUNTER — Other Ambulatory Visit: Payer: Self-pay | Admitting: Internal Medicine

## 2020-08-14 ENCOUNTER — Other Ambulatory Visit: Payer: Self-pay | Admitting: Adult Health Nurse Practitioner

## 2020-08-14 MED FILL — LEVOTHYROXINE SODIUM 112 MC: 112 | 90 days supply | Qty: 90 | Fill #0

## 2020-08-19 ENCOUNTER — Other Ambulatory Visit: Payer: Self-pay

## 2020-08-19 MED ORDER — ESCITALOPRAM OXALATE 10 MG PO TABS
ORAL_TABLET | ORAL | 1 refills | Status: DC
  Filled 2020-08-19: qty 90, 90d supply, fill #0

## 2020-08-20 ENCOUNTER — Other Ambulatory Visit (HOSPITAL_COMMUNITY): Payer: Self-pay

## 2020-08-20 MED ORDER — METOPROLOL SUCCINATE ER 25 MG TABLET,EXTENDED RELEASE 24 HR
25 | ORAL_TABLET | Freq: Every day | ORAL | 2 refills | 90.00000 days | Status: AC
Start: 2020-08-20 — End: 2021-02-18

## 2020-08-20 MED ORDER — SERTRALINE 50 MG TABLET
50 | ORAL_TABLET | ORAL | 4 refills | 90.00000 days | Status: AC
Start: 2020-08-20 — End: 2021-08-19

## 2020-08-23 ENCOUNTER — Other Ambulatory Visit: Payer: Self-pay

## 2020-08-23 DIAGNOSIS — K2100 Gastro-esophageal reflux disease with esophagitis, without bleeding: Secondary | ICD-10-CM

## 2020-08-23 DIAGNOSIS — E079 Disorder of thyroid, unspecified: Secondary | ICD-10-CM

## 2020-08-23 DIAGNOSIS — Z79899 Other long term (current) drug therapy: Secondary | ICD-10-CM

## 2020-08-23 DIAGNOSIS — E782 Mixed hyperlipidemia: Secondary | ICD-10-CM

## 2020-08-24 MED ORDER — PRAVASTATIN 20 MG TABLET
20 | ORAL_TABLET | Freq: Every evening | ORAL | 1 refills | 90.00000 days | Status: AC
Start: 2020-08-24 — End: 2020-12-03

## 2020-08-30 ENCOUNTER — Other Ambulatory Visit (HOSPITAL_COMMUNITY): Payer: Self-pay

## 2020-09-06 DIAGNOSIS — M5126 Other intervertebral disc displacement, lumbar region: Secondary | ICD-10-CM | POA: Diagnosis not present

## 2020-09-06 DIAGNOSIS — M48061 Spinal stenosis, lumbar region without neurogenic claudication: Secondary | ICD-10-CM | POA: Diagnosis not present

## 2020-09-06 DIAGNOSIS — M47816 Spondylosis without myelopathy or radiculopathy, lumbar region: Secondary | ICD-10-CM | POA: Diagnosis not present

## 2020-09-06 DIAGNOSIS — M5416 Radiculopathy, lumbar region: Secondary | ICD-10-CM | POA: Diagnosis not present

## 2020-09-06 NOTE — Progress Notes (Signed)
HPI: FU dyspneaand palpitations. Holter monitor January 2019 showed sinus rhythm with occasional PACs, brief PAT and rare PVC. Echo June 2019 showed normal LV function. Since last seen there is no dyspnea, CP or palpitations; no syncope.  Current Outpatient Medications  Medication Sig Dispense Refill  . ALPRAZolam (XANAX) 0.5 MG tablet TAKE 1/2-1 TAB BY MOUTH UP TO 3 TIMES A DAY AS NEEDED FOR SEVERE ANXIETY OR PANIC. AVOID TAKING DAILY DUE TO RISK OF TOLERANCE AND ADDICTION.MUST LAST 30 DAYS 60 tablet 0  . budesonide-formoterol (SYMBICORT) 80-4.5 MCG/ACT inhaler Inhale 2 puffs daily twice a day, wash your mouth afterwards 1 Inhaler 1  . Cholecalciferol (VITAMIN D PO) Take 5,000 Units by mouth daily.    . cyclobenzaprine (FLEXERIL) 10 MG tablet Take 1 tablet (10 mg total) by mouth every 8 (eight) hours as needed for muscle spasms. 60 tablet 1  . escitalopram (LEXAPRO) 10 MG tablet TAKE 1 TABLET BY MOUTH DAILY FOR MOOD (Patient taking differently: TAKE 1 TABLET BY MOUTH DAILY FOR MOOD) 90 tablet 1  . fluticasone (FLONASE) 50 MCG/ACT nasal spray Place 2 sprays into both nostrils daily. (Patient taking differently: Place 2 sprays into both nostrils as needed.) 16 g 0  . levothyroxine (SYNTHROID) 112 MCG tablet TAKE 1 TAB BY MOUTH DAILY ON AN EMPTY STOMACH WITH ONLY WATER FOR 30 MINUTES & NO ANTACID MEDS, CALCIUM OR MAGNESIUM FOR 4 HOURS AVOID BIOTIN 90 tablet 3  . metoprolol succinate (TOPROL-XL) 50 MG 24 hr tablet TAKE 1 TABLET (50 MG TOTAL) BY MOUTH DAILY. TAKE WITH OR IMMEDIATELY FOLLOWING A MEAL. (Patient taking differently: Take by mouth daily. Take with or immediately following a meal.) 90 tablet 3  . omeprazole (PRILOSEC) 20 MG capsule TAKE 1 CAPSULE (20 MG TOTAL) BY MOUTH DAILY AS NEEDED. 90 capsule 1  . oxybutynin (DITROPAN-XL) 10 MG 24 hr tablet TAKE 1 TABLET BY MOUTH DAILY FOR BLADDER CONTROL 90 tablet 3   No current facility-administered medications for this visit.     Past  Medical History:  Diagnosis Date  . Anxiety   . Breast hypertrophy 03/2014  . GERD (gastroesophageal reflux disease)   . Hyperlipidemia   . Hypothyroidism   . Interstitial cystitis     Past Surgical History:  Procedure Laterality Date  . BREAST BIOPSY Left   . BREAST REDUCTION SURGERY Bilateral 04/18/2014   Procedure: MAMMARY REDUCTION  (BREAST) BILATERAL;  Surgeon: Charlene Brooke, MD;  Location: [place];  Service: Plastics;  Laterality: Bilateral;    Social History   Socioeconomic History  . Marital status: Significant Other    Spouse name: Not on file  . Number of children: 1  . Years of education: Not on file  . Highest education level: Not on file  Occupational History  . Occupation: Therapist, sports  Tobacco Use  . Smoking status: Current Some Day Smoker    Packs/day: 0.50    Years: 30.00    Pack years: 15.00    Types: Cigarettes  . Smokeless tobacco: Never Used  . Tobacco comment: currently down to 0.25 pack/day or so  Vaping Use  . Vaping Use: Never used  Substance and Sexual Activity  . Alcohol use: Yes    Alcohol/week: 10.0 standard drinks    Types: 10 Standard drinks or equivalent per week    Comment: weekends  . Drug use: No  . Sexual activity: Yes    Partners: Male    Birth control/protection: Surgical  Comment: partner had vasectomy  Other Topics Concern  . Not on file  Social History Narrative  . Not on file   Social Determinants of Health   Financial Resource Strain: Not on file  Food Insecurity: Not on file  Transportation Needs: Not on file  Physical Activity: Not on file  Stress: Not on file  Social Connections: Not on file  Intimate Partner Violence: Not on file    Family History  Problem Relation Age of Onset  . CVA Maternal Grandfather 22  . Osteosarcoma Sister 5  . Heart failure Paternal Grandfather        90s  . Breast cancer Neg Hx     ROS: no fevers or chills, productive cough, hemoptysis, dysphasia,  odynophagia, melena, hematochezia, dysuria, hematuria, rash, seizure activity, orthopnea, PND, pedal edema, claudication. Remaining systems are negative.  Physical Exam: Well-developed well-nourished in no acute distress.  Skin is warm and dry.  HEENT is normal.  Neck is supple.  Chest is clear to auscultation with normal expansion.  Cardiovascular exam is regular rate and rhythm.  Abdominal exam nontender or distended. No masses palpated. Extremities show no edema. neuro grossly intact  ECG-normal sinus rhythm at a rate of 72, no ST changes.  Personally reviewed  A/P  1 palpitations-symptoms are controlled.  Continue beta-blocker at present dose.  2 dyspnea-symptoms have improved.  Previous echocardiogram showed normal LV function.  3 tobacco abuse-patient again counseled on discontinuing.  4 hyperlipidemia-she did not tolerate Crestor or Lipitor previously.  We will try pravastatin 40 mg daily.  Check lipids and liver in 12 weeks.  If she does not tolerate we will add Zetia in the future.  Kirk Ruths, MD

## 2020-09-07 ENCOUNTER — Other Ambulatory Visit: Payer: Self-pay | Admitting: Adult Health

## 2020-09-07 DIAGNOSIS — M5416 Radiculopathy, lumbar region: Secondary | ICD-10-CM

## 2020-09-07 DIAGNOSIS — M5136 Other intervertebral disc degeneration, lumbar region: Secondary | ICD-10-CM

## 2020-09-11 ENCOUNTER — Encounter: Payer: Self-pay | Admitting: Physical Medicine & Rehabilitation

## 2020-09-12 ENCOUNTER — Encounter: Payer: Self-pay | Admitting: Internal Medicine

## 2020-09-14 ENCOUNTER — Encounter: Payer: Self-pay | Admitting: Cardiology

## 2020-09-14 ENCOUNTER — Ambulatory Visit: Payer: 59 | Admitting: Cardiology

## 2020-09-14 ENCOUNTER — Other Ambulatory Visit: Payer: Self-pay

## 2020-09-14 ENCOUNTER — Other Ambulatory Visit (HOSPITAL_COMMUNITY): Payer: Self-pay

## 2020-09-14 VITALS — BP 116/74 | HR 72 | Ht 68.0 in | Wt 201.2 lb

## 2020-09-14 DIAGNOSIS — R0600 Dyspnea, unspecified: Secondary | ICD-10-CM | POA: Diagnosis not present

## 2020-09-14 DIAGNOSIS — Z720 Tobacco use: Secondary | ICD-10-CM | POA: Diagnosis not present

## 2020-09-14 DIAGNOSIS — E7800 Pure hypercholesterolemia, unspecified: Secondary | ICD-10-CM | POA: Diagnosis not present

## 2020-09-14 DIAGNOSIS — R002 Palpitations: Secondary | ICD-10-CM

## 2020-09-14 MED ORDER — PRAVASTATIN SODIUM 40 MG PO TABS
40.0000 mg | ORAL_TABLET | Freq: Every evening | ORAL | 3 refills | Status: DC
  Filled 2020-09-14: qty 90, 90d supply, fill #0

## 2020-09-14 NOTE — Patient Instructions (Signed)
Medication Instructions:   START PRAVASTATIN 40 MG ONCE DAILY  *If you need a refill on your cardiac medications before your next appointment, please call your pharmacy*   Lab Work:  Your physician recommends that you return for lab work in: [place]  If you have labs (blood work) drawn today and your tests are completely normal, you will receive your results only by: Marland Kitchen MyChart Message (if you have MyChart) OR . A paper copy in the mail If you have any lab test that is abnormal or we need to change your treatment, we will call you to review the results.   Follow-Up: At [facility], you and your health needs are our priority.  As part of our continuing mission to provide you with exceptional heart care, we have created designated Provider Care Teams.  These Care Teams include your primary Cardiologist (physician) and Advanced Practice Providers (APPs -  Physician Assistants and Nurse Practitioners) who all work together to provide you with the care you need, when you need it.  We recommend signing up for the patient portal called "MyChart".  Sign up information is provided on this After Visit Summary.  MyChart is used to connect with patients for Virtual Visits (Telemedicine).  Patients are able to view lab/test results, encounter notes, upcoming appointments, etc.  Non-urgent messages can be sent to your provider as well.   To learn more about what you can do with MyChart, go to [email or web address].    Your next appointment:   12 month(s)  The format for your next appointment:   In Person  Provider:   Kirk Ruths, MD

## 2020-09-21 ENCOUNTER — Other Ambulatory Visit (HOSPITAL_COMMUNITY): Payer: Self-pay

## 2020-09-21 ENCOUNTER — Other Ambulatory Visit: Payer: Self-pay | Admitting: Adult Health

## 2020-09-21 MED ORDER — PREDNISONE 20 MG PO TABS
ORAL_TABLET | ORAL | 0 refills | Status: AC
  Filled 2020-09-21: qty 20, 11d supply, fill #0

## 2020-09-24 ENCOUNTER — Other Ambulatory Visit (HOSPITAL_COMMUNITY): Payer: Self-pay

## 2020-09-24 MED FILL — Omeprazole Cap Delayed Release 20 MG: ORAL | 90 days supply | Qty: 90 | Fill #0 | Status: AC

## 2020-09-25 ENCOUNTER — Other Ambulatory Visit (HOSPITAL_COMMUNITY): Payer: Self-pay

## 2020-09-30 ENCOUNTER — Other Ambulatory Visit: Payer: Self-pay | Admitting: Cardiology

## 2020-10-01 ENCOUNTER — Other Ambulatory Visit (HOSPITAL_COMMUNITY): Payer: Self-pay

## 2020-10-01 ENCOUNTER — Other Ambulatory Visit: Payer: Self-pay | Admitting: Adult Health

## 2020-10-01 MED ORDER — CYCLOBENZAPRINE HCL 10 MG PO TABS
10.0000 mg | ORAL_TABLET | Freq: Three times a day (TID) | ORAL | 1 refills | Status: DC | PRN
  Filled 2020-10-01: qty 60, 20d supply, fill #0
  Filled 2021-03-21: qty 60, 20d supply, fill #1

## 2020-10-01 MED ORDER — METOPROLOL SUCCINATE ER 50 MG PO TB24
50.0000 mg | ORAL_TABLET | Freq: Every day | ORAL | 3 refills | Status: DC
  Filled 2020-10-01: qty 90, 90d supply, fill #0
  Filled 2021-01-29: qty 90, 90d supply, fill #1
  Filled 2021-05-17: qty 90, 90d supply, fill #2
  Filled 2021-08-20: qty 90, 90d supply, fill #3

## 2020-10-09 NOTE — Progress Notes (Signed)
[place] Phone: [phone] Subjective:   [name], am serving as a scribe for Dr. Hulan Saas. This visit occurred during the SARS-CoV-2 public health emergency.  Safety protocols were in place, including screening questions prior to the visit, additional usage of staff PPE, and extensive cleaning of exam room while observing appropriate contact time as indicated for disinfecting solutions.   I'm seeing this patient by the request  of:  Unk Pinto, MD  CC: Back pain  [id number]  Shari Payne is a 50 y.o. female coming in with complaint of chronic lower back pain. Has seen Dr. Rita Ohara prior to COVID due to radiating pain down R leg. Pain improved somewhat and she pushed through pain. Was doing PT right when COVID started for back pain after a second visit with Dr. Rita Ohara. One year ago, pain changed into shooting sharp pain in lower back with flexion. Uses IBU and Flexeril prn.   MRI Lumbar spine 09/12/2020 in chart but was able to review the report.  Patient's MRI showed moderate spinal stenosis at L4-L5 as well as bilateral right greater than left L5 nerve root impingement.    Past Medical History:  Diagnosis Date  . Anxiety   . Breast hypertrophy 03/2014  . GERD (gastroesophageal reflux disease)   . Hyperlipidemia   . Hypothyroidism   . Interstitial cystitis    Past Surgical History:  Procedure Laterality Date  . BREAST BIOPSY Left   . BREAST REDUCTION SURGERY Bilateral 04/18/2014   Procedure: MAMMARY REDUCTION  (BREAST) BILATERAL;  Surgeon: Charlene Brooke, MD;  Location: [place];  Service: Plastics;  Laterality: Bilateral;   Social History   Socioeconomic History  . Marital status: Married    Spouse name: Not on file  . Number of children: 1  . Years of education: Not on file  . Highest education level: Not on file  Occupational History  . Occupation: Therapist, sports   Tobacco Use  . Smoking status: Current Some Day Smoker    Packs/day: 0.50    Years: 30.00    Pack years: 15.00    Types: Cigarettes  . Smokeless tobacco: Never Used  . Tobacco comment: currently down to 0.25 pack/day or so  Vaping Use  . Vaping Use: Never used  Substance and Sexual Activity  . Alcohol use: Yes    Alcohol/week: 10.0 standard drinks    Types: 10 Standard drinks or equivalent per week    Comment: weekends  . Drug use: No  . Sexual activity: Yes    Partners: Male    Birth control/protection: Surgical    Comment: partner had vasectomy  Other Topics Concern  . Not on file  Social History Narrative  . Not on file   Social Determinants of Health   Financial Resource Strain: Not on file  Food Insecurity: Not on file  Transportation Needs: Not on file  Physical Activity: Not on file  Stress: Not on file  Social Connections: Not on file   Allergies  Allergen Reactions  . Amoxicillin Rash and Other (See Comments)    FEVER  . Bisoprolol Anxiety  . Metoprolol Tartrate Palpitations  . Verapamil Rash   Family History  Problem Relation Age of Onset  . CVA Maternal Grandfather 75  . Osteosarcoma Sister 48  . Heart failure Paternal Grandfather        90s  . Breast cancer Neg Hx     Current  Outpatient Medications (Endocrine & Metabolic):  .  levothyroxine (SYNTHROID) 112 MCG tablet, TAKE 1 TAB BY MOUTH DAILY ON AN EMPTY STOMACH WITH ONLY WATER FOR 30 MINUTES & NO ANTACID MEDS, CALCIUM OR MAGNESIUM FOR 4 HOURS AVOID BIOTIN  Current Outpatient Medications (Cardiovascular):  .  metoprolol succinate (TOPROL-XL) 50 MG 24 hr tablet, Take 1 tablet (50 mg total) by mouth daily. Take with or immediately following a meal. .  pravastatin (PRAVACHOL) 40 MG tablet, Take 1 tablet (40 mg total) by mouth every evening.  Current Outpatient Medications (Respiratory):  .  budesonide-formoterol (SYMBICORT) 80-4.5 MCG/ACT inhaler, Inhale 2 puffs daily twice a day, wash your mouth  afterwards .  fluticasone (FLONASE) 50 MCG/ACT nasal spray, Place 2 sprays into both nostrils daily. (Patient taking differently: Place 2 sprays into both nostrils as needed.)    Current Outpatient Medications (Other):  Marland Kitchen  ALPRAZolam (XANAX) 0.5 MG tablet, TAKE 1/2-1 TAB BY MOUTH UP TO 3 TIMES A DAY AS NEEDED FOR SEVERE ANXIETY OR PANIC. AVOID TAKING DAILY DUE TO RISK OF TOLERANCE AND ADDICTION.MUST LAST 30 DAYS .  Cholecalciferol (VITAMIN D PO), Take 5,000 Units by mouth daily. .  cyclobenzaprine (FLEXERIL) 10 MG tablet, Take 1 tablet (10 mg total) by mouth every 8 (eight) hours as needed for muscle spasms. Marland Kitchen  escitalopram (LEXAPRO) 10 MG tablet, TAKE 1 TABLET BY MOUTH DAILY FOR MOOD (Patient taking differently: TAKE 1 TABLET BY MOUTH DAILY FOR MOOD) .  gabapentin (NEURONTIN) 100 MG capsule, Take 2 capsules (200 mg total) by mouth at bedtime. Marland Kitchen  omeprazole (PRILOSEC) 20 MG capsule, TAKE 1 CAPSULE (20 MG TOTAL) BY MOUTH DAILY AS NEEDED. Marland Kitchen  oxybutynin (DITROPAN-XL) 10 MG 24 hr tablet, TAKE 1 TABLET BY MOUTH DAILY FOR BLADDER CONTROL   Reviewed prior external information including notes and imaging from  primary care provider As well as notes that were available from care everywhere and other healthcare systems.  Past medical history, social, surgical and family history all reviewed in electronic medical record.  No pertanent information unless stated regarding to the chief complaint.   Review of Systems:  No headache, visual changes, nausea, vomiting, diarrhea, constipation, dizziness, abdominal pain, skin rash, fevers, chills, night sweats, weight loss, swollen lymph nodes,  joint swelling, chest pain, shortness of breath, mood changes. POSITIVE muscle aches, body aches  Objective  Blood pressure 102/68, pulse 73, height 5\' 8"  (1.727 m), weight 202 lb (91.6 kg), SpO2 98 %.   General: No apparent distress alert and oriented x3 mood and affect normal, dressed appropriately.  HEENT: Pupils  equal, extraocular movements intact  Respiratory: Patient's speak in full sentences and does not appear short of breath  Cardiovascular: No lower extremity edema, non tender, no erythema  Gait normal with good balance and coordination.  MSK: Low back exam does have significant loss of lordosis.  Tightness with straight leg test noted.  Tightness with FABER test bilaterally as well but seems to be worse with any type of extension of the back.  Tender to palpation minorly more in the paraspinal musculature but some in the midline at L4-L5 more than anywhere else.  Does seem neurovascularly intact distally.     Impression and Recommendations:     The above documentation has been reviewed and is accurate and complete Lyndal Pulley, DO

## 2020-10-11 ENCOUNTER — Ambulatory Visit: Payer: 59 | Admitting: Family Medicine

## 2020-10-11 ENCOUNTER — Other Ambulatory Visit (HOSPITAL_COMMUNITY): Payer: Self-pay

## 2020-10-11 ENCOUNTER — Other Ambulatory Visit: Payer: Self-pay

## 2020-10-11 ENCOUNTER — Encounter: Payer: Self-pay | Admitting: Family Medicine

## 2020-10-11 VITALS — BP 102/68 | HR 73 | Ht 68.0 in | Wt 202.0 lb

## 2020-10-11 DIAGNOSIS — M5416 Radiculopathy, lumbar region: Secondary | ICD-10-CM

## 2020-10-11 MED ORDER — GABAPENTIN 100 MG PO CAPS
200.0000 mg | ORAL_CAPSULE | Freq: Every day | ORAL | 0 refills | Status: DC
  Filled 2020-10-11: qty 180, 90d supply, fill #0

## 2020-10-11 NOTE — Patient Instructions (Signed)
L4/L5 Epidural- [id number] Gabapentin 200mg  at night Tart cherry extract 1200mg  at night Exercises 3x a week See me 4-5 weeks after injection and consider OMT at that time

## 2020-10-11 NOTE — Assessment & Plan Note (Signed)
Patient does have a spinal stenosis noted.  Discussed with patient at great length.  At this point I do feel that patient has not been for some of the conservative therapy and is having some radicular symptoms that is concerning.  We will have patient have an epidural at the L4-L5 area.  In addition to this we will start on a low-dose of gabapentin which I think will be beneficial for her at least with some nighttime relief.  Discussed core strengthening given home exercises.  Patient has been to formal physical therapy previously.  Patient will follow up with me again in 6 weeks at that time patient could be a candidate for osteopathic manipulation.

## 2020-10-18 ENCOUNTER — Other Ambulatory Visit (HOSPITAL_COMMUNITY): Payer: Self-pay

## 2020-10-18 ENCOUNTER — Other Ambulatory Visit: Payer: Self-pay | Admitting: Adult Health

## 2020-10-18 ENCOUNTER — Ambulatory Visit: Payer: 59 | Admitting: Physical Medicine & Rehabilitation

## 2020-10-18 MED ORDER — ESCITALOPRAM OXALATE 20 MG PO TABS
20.0000 mg | ORAL_TABLET | Freq: Every day | ORAL | 1 refills | Status: DC
  Filled 2020-10-18 – 2020-10-29 (×2): qty 90, 90d supply, fill #0
  Filled 2021-01-29: qty 90, 90d supply, fill #1

## 2020-10-19 ENCOUNTER — Other Ambulatory Visit: Payer: Self-pay

## 2020-10-19 ENCOUNTER — Ambulatory Visit
Admission: RE | Admit: 2020-10-19 | Discharge: 2020-10-19 | Disposition: A | Discharge status: Home / Self Care | Payer: 59 | Source: Ambulatory Visit | Attending: Family Medicine | Admitting: Family Medicine

## 2020-10-19 DIAGNOSIS — M5416 Radiculopathy, lumbar region: Secondary | ICD-10-CM

## 2020-10-19 DIAGNOSIS — M47817 Spondylosis without myelopathy or radiculopathy, lumbosacral region: Secondary | ICD-10-CM | POA: Diagnosis not present

## 2020-10-19 MED ORDER — METHYLPREDNISOLONE ACETATE 40 MG/ML INJ SUSP (RADIOLOG
120.0000 mg | Freq: Once | INTRAMUSCULAR | Status: AC
  Administered 2020-10-19: 120 mg via EPIDURAL

## 2020-10-19 MED ORDER — IOPAMIDOL (ISOVUE-M 200) INJECTION 41%
1.0000 mL | Freq: Once | INTRAMUSCULAR | Status: AC
  Administered 2020-10-19: 1 mL via EPIDURAL

## 2020-10-19 NOTE — Discharge Instructions (Signed)
Post Procedure Spinal Discharge Instruction Sheet  1. You may resume a regular diet and any medications that you routinely take (including pain medications).  2. No driving day of procedure.  3. Light activity throughout the rest of the day.  Do not do any strenuous work, exercise, bending or lifting.  The day following the procedure, you can resume normal physical activity but you should refrain from exercising or physical therapy for at least three days thereafter.   Common Side Effects:   Headaches- take your usual medications as directed by your physician.  Increase your fluid intake.  Caffeinated beverages may be helpful.  Lie flat in bed until your headache resolves.   Restlessness or inability to sleep- you may have trouble sleeping for the next few days.  Ask your referring physician if you need any medication for sleep.   Facial flushing or redness- should subside within a few days.   Increased pain- a temporary increase in pain a day or two following your procedure is not unusual.  Take your pain medication as prescribed by your referring physician.   Leg cramps  Please contact our office at [phone] for the following symptoms:  Fever greater than 100 degrees.  Headaches unresolved with medication after 2-3 days.  Increased swelling, pain, or redness at injection site.   Thank you for visiting [facility] today.  

## 2020-10-21 MED FILL — Oxybutynin Chloride Tab ER 24HR 10 MG: ORAL | 90 days supply | Qty: 90 | Fill #0 | Status: AC

## 2020-10-22 ENCOUNTER — Other Ambulatory Visit (HOSPITAL_COMMUNITY): Payer: Self-pay

## 2020-10-26 ENCOUNTER — Other Ambulatory Visit (HOSPITAL_COMMUNITY): Payer: Self-pay

## 2020-10-29 ENCOUNTER — Other Ambulatory Visit (HOSPITAL_COMMUNITY): Payer: Self-pay

## 2020-11-14 MED ORDER — LORAZEPAM 0.5 MG TABLET
0.5 | ORAL_TABLET | Freq: Every day | ORAL | 1 refills | 15.00000 days | Status: AC | PRN
Start: 2020-11-14 — End: ?

## 2020-11-25 MED FILL — Levothyroxine Sodium Tab 112 MCG: ORAL | 90 days supply | Qty: 90 | Fill #0 | Status: AC

## 2020-11-26 ENCOUNTER — Other Ambulatory Visit (HOSPITAL_COMMUNITY): Payer: Self-pay

## 2020-11-27 DIAGNOSIS — R928 Other abnormal and inconclusive findings on diagnostic imaging of breast: Secondary | ICD-10-CM | POA: Diagnosis not present

## 2020-11-27 LAB — HM MAMMOGRAPHY

## 2020-12-03 ENCOUNTER — Other Ambulatory Visit (HOSPITAL_COMMUNITY): Payer: Self-pay

## 2020-12-03 MED ORDER — PRAVASTATIN 20 MG TABLET
20 | ORAL_TABLET | Freq: Every evening | ORAL | 1 refills | 90.00000 days | Status: AC
Start: 2020-12-03 — End: 2021-01-31

## 2020-12-12 NOTE — Progress Notes (Signed)
[place] Phone: [phone] Subjective:   [name], am serving as a scribe for Dr. Hulan Saas.  This visit occurred during the SARS-CoV-2 public health emergency.  Safety protocols were in place, including screening questions prior to the visit, additional usage of staff PPE, and extensive cleaning of exam room while observing appropriate contact time as indicated for disinfecting solutions.   I'm seeing this patient by the request  of:  Unk Pinto, MD  CC: Low back pain follow-up  [id number]  10/11/2020 Patient does have a spinal stenosis noted.  Discussed with patient at great length.  At this point I do feel that patient has not been for some of the conservative therapy and is having some radicular symptoms that is concerning.  We will have patient have an epidural at the L4-L5 area.  In addition to this we will start on a low-dose of gabapentin which I think will be beneficial for her at least with some nighttime relief.  Discussed core strengthening given home exercises.  Patient has been to formal physical therapy previously.  Patient will follow up with me again in 6 weeks at that time patient could be a candidate for osteopathic manipulation.  Update 12/13/2020 SARHA SASSO is a 50 y.o. female coming in with complaint of lumbar radiculopathy. States that epidural did take 2 weeks to take effect. Spasms have decreased but small amounts of activity still cause severe pain in her back. Is using gabapentin and tart cherry but does not feel as if these are helping.       Past Medical History:  Diagnosis Date   Anxiety    Breast hypertrophy 03/2014   GERD (gastroesophageal reflux disease)    Hyperlipidemia    Hypothyroidism    Interstitial cystitis    Past Surgical History:  Procedure Laterality Date   BREAST BIOPSY Left    BREAST REDUCTION SURGERY Bilateral 04/18/2014   Procedure:  MAMMARY REDUCTION  (BREAST) BILATERAL;  Surgeon: Charlene Brooke, MD;  Location: [facility];  Service: Plastics;  Laterality: Bilateral;   Social History   Socioeconomic History   Marital status: Married    Spouse name: Not on file   Number of children: 1   Years of education: Not on file   Highest education level: Not on file  Occupational History   Occupation: RN  Tobacco Use   Smoking status: Some Days    Packs/day: 0.50    Years: 30.00    Pack years: 15.00    Types: Cigarettes   Smokeless tobacco: Never   Tobacco comments:    currently down to 0.25 pack/day or so  Vaping Use   Vaping Use: Never used  Substance and Sexual Activity   Alcohol use: Yes    Alcohol/week: 10.0 standard drinks    Types: 10 Standard drinks or equivalent per week    Comment: weekends   Drug use: No   Sexual activity: Yes    Partners: Male    Birth control/protection: Surgical    Comment: partner had vasectomy  Other Topics Concern   Not on file  Social History Narrative   Not on file   Social Determinants of Health   Financial Resource Strain: Not on file  Food Insecurity: Not on file  Transportation Needs: Not on file  Physical Activity: Not on file  Stress: Not on file  Social Connections: Not on file   Allergies  Allergen  Reactions   Amoxicillin Rash and Other (See Comments)    FEVER   Bisoprolol Anxiety   Metoprolol Tartrate Palpitations   Verapamil Rash   Family History  Problem Relation Age of Onset   CVA Maternal Grandfather 62   Osteosarcoma Sister 25   Heart failure Paternal Grandfather        90s   Breast cancer Neg Hx     Current Outpatient Medications (Endocrine & Metabolic):    levothyroxine (SYNTHROID) 112 MCG tablet, TAKE 1 TAB BY MOUTH DAILY ON AN EMPTY STOMACH WITH ONLY WATER FOR 30 MINUTES & NO ANTACID MEDS, CALCIUM OR MAGNESIUM FOR 4 HOURS AVOID BIOTIN  Current Outpatient Medications (Cardiovascular):    metoprolol succinate  (TOPROL-XL) 50 MG 24 hr tablet, Take 1 tablet (50 mg total) by mouth daily. Take with or immediately following a meal.   pravastatin (PRAVACHOL) 40 MG tablet, Take 1 tablet (40 mg total) by mouth every evening.  Current Outpatient Medications (Respiratory):    budesonide-formoterol (SYMBICORT) 80-4.5 MCG/ACT inhaler, Inhale 2 puffs daily twice a day, wash your mouth afterwards   fluticasone (FLONASE) 50 MCG/ACT nasal spray, Place 2 sprays into both nostrils daily. (Patient taking differently: Place 2 sprays into both nostrils as needed.)    Current Outpatient Medications (Other):    ALPRAZolam (XANAX) 0.5 MG tablet, TAKE 1/2-1 TAB BY MOUTH UP TO 3 TIMES A DAY AS NEEDED FOR SEVERE ANXIETY OR PANIC. AVOID TAKING DAILY DUE TO RISK OF TOLERANCE AND ADDICTION.MUST LAST 30 DAYS   Cholecalciferol (VITAMIN D PO), Take 5,000 Units by mouth daily.   cyclobenzaprine (FLEXERIL) 10 MG tablet, Take 1 tablet (10 mg total) by mouth every 8 (eight) hours as needed for muscle spasms.   escitalopram (LEXAPRO) 20 MG tablet, Take 1 tablet (20 mg total) by mouth daily for mood   gabapentin (NEURONTIN) 100 MG capsule, Take 2 capsules (200 mg total) by mouth at bedtime.   omeprazole (PRILOSEC) 20 MG capsule, TAKE 1 CAPSULE (20 MG TOTAL) BY MOUTH DAILY AS NEEDED.   oxybutynin (DITROPAN-XL) 10 MG 24 hr tablet, TAKE 1 TABLET BY MOUTH DAILY FOR BLADDER CONTROL   Reviewed prior external information including notes and imaging from  primary care provider As well as notes that were available from care everywhere and other healthcare systems.  Past medical history, social, surgical and family history all reviewed in electronic medical record.  No pertanent information unless stated regarding to the chief complaint.   Review of Systems:  No headache, visual changes, nausea, vomiting, diarrhea, constipation, dizziness, abdominal pain, skin rash, fevers, chills, night sweats, weight loss, swollen lymph nodes, body aches,  joint swelling, chest pain, shortness of breath, mood changes. POSITIVE muscle aches  Objective  Blood pressure 110/82, pulse 80, height '5\' 8"'$  (1.727 m), weight 203 lb (92.1 kg), SpO2 99 %.   General: No apparent distress alert and oriented x3 mood and affect normal, dressed appropriately.  HEENT: Pupils equal, extraocular movements intact  Respiratory: Patient's speak in full sentences and does not appear short of breath  Cardiovascular: No lower extremity edema, non tender, no erythema  Gait normal with good balance and coordination.  MSK: Low back exam does have some loss of lordosis.  Patient unfortunately does continue to have some mild radicular symptoms on the right side with straight leg test.  No significant weakness.  Tenderness to palpation over the L4-L5 area right greater than left.  Mild pain over the sacroiliac joints bilaterally as well.  Osteopathic findings T8  extended rotated and side bent right L3 flexed rotated and side bent right Sacrum left on left   Impression and Recommendations:     The above documentation has been reviewed and is accurate and complete Lyndal Pulley, DO

## 2020-12-13 ENCOUNTER — Ambulatory Visit: Payer: 59 | Admitting: Family Medicine

## 2020-12-13 ENCOUNTER — Encounter: Payer: Self-pay | Admitting: Family Medicine

## 2020-12-13 ENCOUNTER — Other Ambulatory Visit: Payer: Self-pay

## 2020-12-13 VITALS — BP 110/82 | HR 80 | Ht 68.0 in | Wt 203.0 lb

## 2020-12-13 DIAGNOSIS — M5416 Radiculopathy, lumbar region: Secondary | ICD-10-CM

## 2020-12-13 DIAGNOSIS — M9904 Segmental and somatic dysfunction of sacral region: Secondary | ICD-10-CM

## 2020-12-13 DIAGNOSIS — M9902 Segmental and somatic dysfunction of thoracic region: Secondary | ICD-10-CM

## 2020-12-13 DIAGNOSIS — M9903 Segmental and somatic dysfunction of lumbar region: Secondary | ICD-10-CM

## 2020-12-13 NOTE — Assessment & Plan Note (Signed)
   Decision today to treat with OMT was based on Physical Exam  After verbal consent patient was treated with , ME, techniques in  thoracic, lumbar and sacral areas,   Patient tolerated the procedure well with improvement in symptoms  Patient given exercises, stretches and lifestyle modifications  See medications in patient instructions if given  Patient will follow up in 4-8 weeks

## 2020-12-13 NOTE — Patient Instructions (Signed)
See me in 6 weeks Epidural [phone]

## 2020-12-13 NOTE — Assessment & Plan Note (Signed)
Patient responded very well to the epidural but thinks another 1 would be very beneficial.  We will order one at this time.  Attempted osteopathic manipulation and patient did have some mild resolution of pain.  Discussed icing regimen and home exercises.  Increase activity slowly.  Follow-up again in 6 to 8 weeks.  Discussed continuing medications including gabapentin.

## 2020-12-18 ENCOUNTER — Encounter: Payer: Self-pay | Admitting: Internal Medicine

## 2020-12-20 ENCOUNTER — Other Ambulatory Visit: Payer: Self-pay

## 2020-12-20 ENCOUNTER — Ambulatory Visit
Admission: RE | Admit: 2020-12-20 | Discharge: 2020-12-20 | Disposition: A | Discharge status: Home / Self Care | Payer: 59 | Source: Ambulatory Visit | Attending: Family Medicine | Admitting: Family Medicine

## 2020-12-20 DIAGNOSIS — M5416 Radiculopathy, lumbar region: Secondary | ICD-10-CM

## 2020-12-20 DIAGNOSIS — M545 Low back pain, unspecified: Secondary | ICD-10-CM | POA: Diagnosis not present

## 2020-12-20 MED ORDER — IOPAMIDOL (ISOVUE-M 200) INJECTION 41%
1.0000 mL | Freq: Once | INTRAMUSCULAR | Status: AC
  Administered 2020-12-20: 1 mL via EPIDURAL

## 2020-12-20 MED ORDER — METHYLPREDNISOLONE ACETATE 40 MG/ML INJ SUSP (RADIOLOG
80.0000 mg | Freq: Once | INTRAMUSCULAR | Status: AC
  Administered 2020-12-20: 80 mg via EPIDURAL

## 2020-12-20 NOTE — Discharge Instructions (Signed)

## 2020-12-27 ENCOUNTER — Other Ambulatory Visit (HOSPITAL_COMMUNITY): Payer: Self-pay

## 2020-12-27 ENCOUNTER — Other Ambulatory Visit: Payer: Self-pay | Admitting: Adult Health

## 2020-12-27 DIAGNOSIS — K2100 Gastro-esophageal reflux disease with esophagitis, without bleeding: Secondary | ICD-10-CM

## 2020-12-27 MED ORDER — OMEPRAZOLE 20 MG PO CPDR
20.0000 mg | DELAYED_RELEASE_CAPSULE | Freq: Every day | ORAL | 1 refills | Status: DC | PRN
  Filled 2020-12-27: qty 90, 90d supply, fill #0
  Filled 2021-03-30 – 2021-04-15 (×2): qty 90, 90d supply, fill #1

## 2021-01-01 ENCOUNTER — Other Ambulatory Visit: Payer: Self-pay | Admitting: Adult Health

## 2021-01-01 ENCOUNTER — Other Ambulatory Visit (HOSPITAL_COMMUNITY): Payer: Self-pay

## 2021-01-01 MED ORDER — ALPRAZOLAM 0.5 MG PO TABS
ORAL_TABLET | ORAL | 0 refills | Status: DC
  Filled 2021-01-01: qty 60, 30d supply, fill #0

## 2021-01-02 ENCOUNTER — Encounter: Payer: 59 | Admitting: Physician Assistant

## 2021-01-12 ENCOUNTER — Telehealth: Payer: 59 | Admitting: Nurse Practitioner

## 2021-01-12 DIAGNOSIS — U071 COVID-19: Secondary | ICD-10-CM | POA: Diagnosis not present

## 2021-01-12 MED ORDER — MOLNUPIRAVIR EUA 200MG CAPSULE: 4.0000 | ORAL_CAPSULE | Freq: Two times a day (BID) | ORAL | 0 refills | Status: AC

## 2021-01-12 NOTE — Progress Notes (Signed)
Virtual Visit Consent   Shari Payne, you are scheduled for a virtual visit with Mary-Margaret Hassell Done, [place], a [facility] provider, today.     Just as with appointments in the office, your consent must be obtained to participate.  Your consent will be active for this visit and any virtual visit you may have with one of our providers in the next 365 days.     If you have a MyChart account, a copy of this consent can be sent to you electronically.  All virtual visits are billed to your insurance company just like a traditional visit in the office.    As this is a virtual visit, video technology does not allow for your provider to perform a traditional examination.  This may limit your provider's ability to fully assess your condition.  If your provider identifies any concerns that need to be evaluated in person or the need to arrange testing (such as labs, EKG, etc.), we will make arrangements to do so.     Although advances in technology are sophisticated, we cannot ensure that it will always work on either your end or our end.  If the connection with a video visit is poor, the visit may have to be switched to a telephone visit.  With either a video or telephone visit, we are not always able to ensure that we have a secure connection.     I need to obtain your verbal consent now.   Are you willing to proceed with your visit today? YES   Shari Payne has provided verbal consent on 01/12/2021 for a virtual visit (video or telephone).   Mary-Margaret Hassell Done, FNP   Date: 01/12/2021 11:07 AM   Virtual Visit via Video Note   I, Mary-Margaret Hassell Done, connected with Shari Payne ([id number], [birth year]) on 01/12/21 at 11:15 AM EDT by a video-enabled telemedicine application and verified that I am speaking with the correct person using two identifiers.  Location: Patient: Virtual Visit Location Patient: Home Provider: Virtual Visit Location Provider: Mobile   I discussed the  limitations of evaluation and management by telemedicine and the availability of in person appointments. The patient expressed understanding and agreed to proceed.    History of Present Illness: Shari Payne is a 50 y.o. who identifies as a female who was assigned female at birth, and is being seen today for covid positive.  HPI Patient said that Wednesday morning she woke up with mild headache and sniffles. As the day went on headache worsened and just felt lousy. When she got home she tested positive for covid. She say was tolerable at first but today feels worse. Feels like it is going into her chest.  Review of Systems  Constitutional:  Positive for malaise/fatigue. Negative for chills and fever.  HENT:  Positive for congestion and sore throat.   Respiratory:  Positive for cough and sputum production. Negative for shortness of breath.   Musculoskeletal:  Positive for myalgias.  Neurological:  Positive for headaches.  All other systems reviewed and are negative.  Problems:  Patient Active Problem List   Diagnosis Date Noted   Somatic dysfunction of spine, lumbar 12/13/2020   Lumbar back pain with radiculopathy affecting right lower extremity 08/09/2020   DDD (degenerative disc disease), lumbar 08/03/2020   Smoker 12/16/2018   Tachycardia 05/24/2017   Insomnia 04/23/2017   Hyperlipidemia 04/23/2017   Obesity (BMI 30.0-34.9) 04/23/2017   OAB (overactive bladder) 08/15/2014   Generalized anxiety disorder 08/15/2014  GERD (gastroesophageal reflux disease) 08/15/2014   Thyroid disease    Vitamin D deficiency     Allergies:  Allergies  Allergen Reactions   Amoxicillin Rash and Other (See Comments)    FEVER   Bisoprolol Anxiety   Metoprolol Tartrate Palpitations   Verapamil Rash   Medications:  Current Outpatient Medications:    ALPRAZolam (XANAX) 0.5 MG tablet, TAKE 1/2-1 TAB BY MOUTH UP TO 3 TIMES A DAY AS NEEDED FOR SEVERE ANXIETY OR PANIC. AVOID TAKING DAILY DUE TO  RISK OF TOLERANCE AND ADDICTION.MUST LAST 30 DAYS, Disp: 60 tablet, Rfl: 0   budesonide-formoterol (SYMBICORT) 80-4.5 MCG/ACT inhaler, Inhale 2 puffs daily twice a day, wash your mouth afterwards, Disp: 1 Inhaler, Rfl: 1   Cholecalciferol (VITAMIN D PO), Take 5,000 Units by mouth daily., Disp: , Rfl:    cyclobenzaprine (FLEXERIL) 10 MG tablet, Take 1 tablet (10 mg total) by mouth every 8 (eight) hours as needed for muscle spasms., Disp: 60 tablet, Rfl: 1   escitalopram (LEXAPRO) 20 MG tablet, Take 1 tablet (20 mg total) by mouth daily for mood, Disp: 90 tablet, Rfl: 1   fluticasone (FLONASE) 50 MCG/ACT nasal spray, Place 2 sprays into both nostrils daily. (Patient taking differently: Place 2 sprays into both nostrils as needed.), Disp: 16 g, Rfl: 0   gabapentin (NEURONTIN) 100 MG capsule, Take 2 capsules (200 mg total) by mouth at bedtime., Disp: 180 capsule, Rfl: 0   levothyroxine (SYNTHROID) 112 MCG tablet, TAKE 1 TAB BY MOUTH DAILY ON AN EMPTY STOMACH WITH ONLY WATER FOR 30 MINUTES & NO ANTACID MEDS, CALCIUM OR MAGNESIUM FOR 4 HOURS AVOID BIOTIN, Disp: 90 tablet, Rfl: 3   metoprolol succinate (TOPROL-XL) 50 MG 24 hr tablet, Take 1 tablet (50 mg total) by mouth daily. Take with or immediately following a meal., Disp: 90 tablet, Rfl: 3   omeprazole (PRILOSEC) 20 MG capsule, Take 1 capsule (20 mg total) by mouth daily as needed., Disp: 90 capsule, Rfl: 1   oxybutynin (DITROPAN-XL) 10 MG 24 hr tablet, TAKE 1 TABLET BY MOUTH DAILY FOR BLADDER CONTROL, Disp: 90 tablet, Rfl: 3   pravastatin (PRAVACHOL) 40 MG tablet, Take 1 tablet (40 mg total) by mouth every evening., Disp: 90 tablet, Rfl: 3  Observations/Objective: Patient is well-developed, well-nourished in no acute distress.  Resting comfortably  at home.  Head is normocephalic, atraumatic.  No labored breathing.  Speech is clear and coherent with logical content.  Patient is alert and oriented at baseline.  Voice hoarse Cough dry  Assessment  and Plan:  Shari Payne in today with chief complaint of Covid Positive (/)   1. Lab test positive for detection of COVID-19 virus 1. Take meds as prescribed 2. Use a cool mist humidifier especially during the winter months and when heat has been humid. 3. Use saline nose sprays frequently 4. Saline irrigations of the nose can be very helpful if done frequently.  * 4X daily for 1 week*  * Use of a nettie pot can be helpful with this. Follow directions with this* 5. Drink plenty of fluids 6. Keep thermostat turn down low 7.For any cough or congestion  Use plain Mucinex- regular strength or max strength is fine   * Children- consult with Pharmacist for dosing 8. For fever or aces or pains- take tylenol or ibuprofen appropriate for age and weight.  * for fevers greater than 101 orally you may alternate ibuprofen and tylenol every  3 hours.   Meds ordered this  encounter  Medications   molnupiravir EUA 200 mg CAPS    Sig: Take 4 capsules (800 mg total) by mouth 2 (two) times daily for 5 days.    Dispense:  40 capsule    Refill:  0    Order Specific Question:   Supervising Provider    Answer:   Noemi Chapel [[id number]]       Follow Up Instructions: I discussed the assessment and treatment plan with the patient. The patient was provided an opportunity to ask questions and all were answered. The patient agreed with the plan and demonstrated an understanding of the instructions.  A copy of instructions were sent to the patient via MyChart.  The patient was advised to call back or seek an in-person evaluation if the symptoms worsen or if the condition fails to improve as anticipated.  Time:  I spent 10 minutes with the patient via telehealth technology discussing the above problems/concerns.    Mary-Margaret Hassell Done, FNP

## 2021-01-12 NOTE — Patient Instructions (Signed)
You are being prescribed MOLNUPIRAVIR for COVID-19 infection.  ° ° °Please call the pharmacy or go through the drive through vs going inside if you are picking up the mediation yourself to prevent further spread. If prescribed to a [facility] affiliated pharmacy, a pharmacist will bring the medication out to your car. ° ° °ADMINISTRATION INSTRUCTIONS: °Take with or without food. Swallow the tablets whole. Don't chew, crush, or break the medications because it might not work as well ° °For each dose of the medication, you should be taking FOUR tablets at one time, TWICE a day  ° °Finish your full five-day course of Molnupiravir even if you feel better before you're done. Stopping this medication too early can make it less effective to prevent severe illness related to COVID19.   ° °Molnupiravir is prescribed for YOU ONLY. Don't share it with others, even if they have similar symptoms as you. This medication might not be right for everyone.  ° °Make sure to take steps to protect yourself and others while you're taking this medication in order to get well soon and to prevent others from getting sick with COVID-19. ° ° °**If you are of childbearing potential (any gender) - it is advised to not get pregnant while taking this medication and recommended that condoms are used for female partners the next 3 months after taking the medication out of extreme caution  ° ° °COMMON SIDE EFFECTS: °Diarrhea °Nausea  °Dizziness ° ° ° °If your COVID-19 symptoms get worse, get medical help right away. Call 911 if you experience symptoms such as worsening cough, trouble breathing, chest pain that doesn't go away, confusion, a hard time staying awake, and pale or blue-colored skin. °This medication won't prevent all COVID-19 cases from getting worse.  ° ° °

## 2021-01-16 ENCOUNTER — Ambulatory Visit
Admission: EM | Admit: 2021-01-16 | Discharge: 2021-01-16 | Disposition: A | Discharge status: Home / Self Care | Payer: 59

## 2021-01-16 ENCOUNTER — Other Ambulatory Visit: Payer: Self-pay

## 2021-01-16 DIAGNOSIS — U071 COVID-19: Secondary | ICD-10-CM | POA: Diagnosis not present

## 2021-01-16 NOTE — Discharge Instructions (Addendum)
Continue Flonase, Claritin, Symbicort and Delsym Drink plenty of water and fluids Rest Follow-up if not improving or worsening

## 2021-01-16 NOTE — ED Triage Notes (Addendum)
Pt states last Wednesday she tested positive for Covid, started with cough as well as antiviral. PCP recommended she get checked out. Pt reports difficulty hearing in left ear.

## 2021-01-17 NOTE — ED Provider Notes (Signed)
[facility]    CSN: [id number] Arrival date & time: 01/16/21  1237      History   Chief Complaint Chief Complaint  Patient presents with   Cough    HPI Shari Payne is a 50 y.o. female history of GERD, hyperlipidemia, presenting today for evaluation of cough.  Patient is a positive for COVID approximately 1 week ago, reports symptoms minimal the first 2 days, this past Saturday woke up with congestion and cough, fatigue.  Started Paxlovid.  Last day of Paxlovid today.  She denies any chest pain or shortness of breath.  She has had some mild tightness.  She denies recent worsening.  She reports working from home throughout course of symptoms.  HPI  Past Medical History:  Diagnosis Date   Anxiety    Breast hypertrophy 03/2014   GERD (gastroesophageal reflux disease)    Hyperlipidemia    Hypothyroidism    Interstitial cystitis     Patient Active Problem List   Diagnosis Date Noted   Somatic dysfunction of spine, lumbar 12/13/2020   Lumbar back pain with radiculopathy affecting right lower extremity 08/09/2020   DDD (degenerative disc disease), lumbar 08/03/2020   Smoker 12/16/2018   Tachycardia 05/24/2017   Insomnia 04/23/2017   Hyperlipidemia 04/23/2017   Obesity (BMI 30.0-34.9) 04/23/2017   OAB (overactive bladder) 08/15/2014   Generalized anxiety disorder 08/15/2014   GERD (gastroesophageal reflux disease) 08/15/2014   Thyroid disease    Vitamin D deficiency     Past Surgical History:  Procedure Laterality Date   BREAST BIOPSY Left    BREAST REDUCTION SURGERY Bilateral 04/18/2014   Procedure: MAMMARY REDUCTION  (BREAST) BILATERAL;  Surgeon: Charlene Brooke, MD;  Location: [place];  Service: Plastics;  Laterality: Bilateral;    OB History   No obstetric history on file.      Home Medications    Prior to Admission medications   Medication Sig Start Date End Date Taking? Authorizing Provider  ALPRAZolam (XANAX) 0.5 MG  tablet TAKE 1/2-1 TAB BY MOUTH UP TO 3 TIMES A DAY AS NEEDED FOR SEVERE ANXIETY OR PANIC. AVOID TAKING DAILY DUE TO RISK OF TOLERANCE AND ADDICTION.MUST LAST 30 DAYS 01/01/21 06/30/21  Liane Comber, NP  budesonide-formoterol [facility]) 80-4.5 MCG/ACT inhaler Inhale 2 puffs daily twice a day, wash your mouth afterwards 08/09/19   Vladimir Crofts, PA-C  Cholecalciferol (VITAMIN D PO) Take 5,000 Units by mouth daily.    [provider]  cyclobenzaprine (FLEXERIL) 10 MG tablet Take 1 tablet (10 mg total) by mouth every 8 (eight) hours as needed for muscle spasms. 10/01/20   Liane Comber, NP  escitalopram (LEXAPRO) 20 MG tablet Take 1 tablet (20 mg total) by mouth daily for mood 10/18/20   Liane Comber, NP  fluticasone (FLONASE) 50 MCG/ACT nasal spray Place 2 sprays into both nostrils daily. Patient taking differently: Place 2 sprays into both nostrils as needed. 01/10/18   Brunetta Jeans, PA-C  gabapentin (NEURONTIN) 100 MG capsule Take 2 capsules (200 mg total) by mouth at bedtime. 10/11/20   Lyndal Pulley, DO  levothyroxine (SYNTHROID) 112 MCG tablet TAKE 1 TAB BY MOUTH DAILY ON AN EMPTY STOMACH WITH ONLY WATER FOR 30 MINUTES & NO ANTACID MEDS, CALCIUM OR MAGNESIUM FOR 4 HOURS AVOID BIOTIN 08/14/20 08/14/21  Garnet Sierras, NP  metoprolol succinate (TOPROL-XL) 50 MG 24 hr tablet Take 1 tablet (50 mg total) by mouth daily. Take with or immediately following a meal. 10/01/20 10/01/21  Lelon Perla, MD  molnupiravir EUA 200 mg CAPS Take 4 capsules (800 mg total) by mouth 2 (two) times daily for 5 days. 01/12/21 01/17/21  Hassell Done, Mary-Margaret, FNP  omeprazole (PRILOSEC) 20 MG capsule Take 1 capsule (20 mg total) by mouth daily as needed. 12/27/20   Liane Comber, NP  oxybutynin (DITROPAN-XL) 10 MG 24 hr tablet TAKE 1 TABLET BY MOUTH DAILY FOR BLADDER CONTROL 04/30/20 04/30/21  Liane Comber, NP  pravastatin (PRAVACHOL) 40 MG tablet Take 1 tablet (40 mg total) by mouth every evening.  09/14/20   Lelon Perla, MD    Family History Family History  Problem Relation Age of Onset   CVA Maternal Grandfather 90   Osteosarcoma Sister 62   Heart failure Paternal Grandfather        90s   Breast cancer Neg Hx     Social History Social History   Tobacco Use   Smoking status: Some Days    Packs/day: 0.50    Years: 30.00    Pack years: 15.00    Types: Cigarettes   Smokeless tobacco: Never   Tobacco comments:    currently down to 0.25 pack/day or so  Vaping Use   Vaping Use: Never used  Substance Use Topics   Alcohol use: Yes    Alcohol/week: 10.0 standard drinks    Types: 10 Standard drinks or equivalent per week    Comment: weekends   Drug use: No     Allergies   Amoxicillin, Bisoprolol, Metoprolol tartrate, and Verapamil   Review of Systems Review of Systems  Constitutional:  Positive for fatigue. Negative for activity change, appetite change, chills and fever.  HENT:  Positive for congestion, rhinorrhea and sore throat. Negative for ear pain, sinus pressure and trouble swallowing.   Eyes:  Negative for discharge and redness.  Respiratory:  Positive for cough. Negative for chest tightness and shortness of breath.   Cardiovascular:  Negative for chest pain.  Gastrointestinal:  Negative for abdominal pain, diarrhea, nausea and vomiting.  Musculoskeletal:  Negative for myalgias.  Skin:  Negative for rash.  Neurological:  Negative for dizziness, light-headedness and headaches.    Physical Exam Triage Vital Signs ED Triage Vitals  Enc Vitals Group     BP 01/16/21 1254 121/85     Pulse Rate 01/16/21 1254 71     Resp 01/16/21 1254 20     Temp 01/16/21 1254 98.8 F (37.1 C)     Temp Source 01/16/21 1254 Oral     SpO2 01/16/21 1254 97 %     Weight --      Height --      Head Circumference --      Peak Flow --      Pain Score 01/16/21 1250 3     Pain Loc --      Pain Edu? --      Excl. in [place]? --    No data found.  Updated Vital Signs BP  121/85 (BP Location: Left Arm)   Pulse 71   Temp 98.8 F (37.1 C) (Oral)   Resp 20   SpO2 97%   Visual Acuity Right Eye Distance:   Left Eye Distance:   Bilateral Distance:    Right Eye Near:   Left Eye Near:    Bilateral Near:     Physical Exam Vitals and nursing note reviewed.  Constitutional:      Appearance: She is well-developed.     Comments: No acute distress  HENT:  Head: Normocephalic and atraumatic.     Ears:     Comments: Bilateral ears without tenderness to palpation of external auricle, tragus and mastoid, EAC's without erythema or swelling, TM's with good bony landmarks and cone of light. Non erythematous.      Nose: Nose normal.     Mouth/Throat:     Comments: Oral mucosa pink and moist, no tonsillar enlargement or exudate. Posterior pharynx patent and nonerythematous, no uvula deviation or swelling. Normal phonation.   Eyes:     Conjunctiva/sclera: Conjunctivae normal.  Cardiovascular:     Rate and Rhythm: Normal rate and regular rhythm.  Pulmonary:     Effort: Pulmonary effort is normal. No respiratory distress.     Comments: Breathing comfortably at rest, CTABL, no wheezing, rales or other adventitious sounds auscultated  Abdominal:     General: There is no distension.  Musculoskeletal:        General: Normal range of motion.     Cervical back: Neck supple.  Skin:    General: Skin is warm and dry.  Neurological:     Mental Status: She is alert and oriented to person, place, and time.     UC Treatments / Results  Labs (all labs ordered are listed, but only abnormal results are displayed) Labs Reviewed - No data to display  EKG   Radiology No results found.  Procedures Procedures (including critical care time)  Medications Ordered in UC Medications - No data to display  Initial Impression / Assessment and Plan / UC Course  I have reviewed the triage vital signs and the nursing notes.  Pertinent labs & imaging results that were  available during my care of the patient were reviewed by me and considered in my medical decision making (see chart for details).     COVID-19-vital signs stable, exam reassuring, lungs clear to auscultation, recommended continued symptomatic and supportive care rest and fluids and close monitoring.  Discussed strict return precautions. Patient verbalized understanding and is agreeable with plan.  Final Clinical Impressions(s) / UC Diagnoses   Final diagnoses:  [id number] virus infection     Discharge Instructions      Continue Flonase, Claritin, Symbicort and Delsym Drink plenty of water and fluids Rest Follow-up if not improving or worsening      ED Prescriptions   None    PDMP not reviewed this encounter.   Janith Lima, [place] 01/17/21 [phone]

## 2021-01-29 ENCOUNTER — Other Ambulatory Visit (HOSPITAL_COMMUNITY): Payer: Self-pay

## 2021-01-29 MED FILL — Oxybutynin Chloride Tab ER 24HR 10 MG: ORAL | 90 days supply | Qty: 90 | Fill #1 | Status: AC

## 2021-01-31 ENCOUNTER — Encounter: Admit: 2021-01-31 | Payer: PRIVATE HEALTH INSURANCE | Attending: Obstetrics and Gynecology | Primary: Internal Medicine

## 2021-01-31 ENCOUNTER — Ambulatory Visit: Admit: 2021-01-31 | Payer: PRIVATE HEALTH INSURANCE | Attending: Obstetrics and Gynecology | Primary: Internal Medicine

## 2021-01-31 DIAGNOSIS — Z889 Allergy status to unspecified drugs, medicaments and biological substances status: Secondary | ICD-10-CM

## 2021-01-31 DIAGNOSIS — E785 Hyperlipidemia, unspecified: Secondary | ICD-10-CM

## 2021-01-31 DIAGNOSIS — Z01419 Encounter for gynecological examination (general) (routine) without abnormal findings: Secondary | ICD-10-CM

## 2021-01-31 DIAGNOSIS — I10 Essential (primary) hypertension: Secondary | ICD-10-CM

## 2021-01-31 DIAGNOSIS — E559 Vitamin D deficiency, unspecified: Secondary | ICD-10-CM

## 2021-01-31 DIAGNOSIS — R748 Abnormal levels of other serum enzymes: Secondary | ICD-10-CM

## 2021-01-31 DIAGNOSIS — Z1231 Encounter for screening mammogram for malignant neoplasm of breast: Secondary | ICD-10-CM

## 2021-01-31 DIAGNOSIS — F419 Anxiety disorder, unspecified: Secondary | ICD-10-CM

## 2021-01-31 DIAGNOSIS — J45909 Unspecified asthma, uncomplicated: Secondary | ICD-10-CM

## 2021-01-31 NOTE — Progress Notes
SUBJECTIVEJennifer Judie Petit Michael is a 50 y.o. G1P1001 woman who presents for annual exam.  Patient's last menstrual period was 01/27/2021 (exact date).  She reports her menses are regular and cyclic qmonth.  She has no complaints, concerns or questions.She has hot flashes and night sweats only occasionally She denies spotting She declines  vaginal dryness PGYNHXLast Pap:  June 2021 was normal:positive HPV2/2017 normal pap negative HPV 10/2010 normal positive HPV SEXUAL ZO:XWRUEAV  currently sexually activeMarital Status:  marriedCurrent contraceptive method is coitus interruptus (withdrawal)SOCIAL WU:[id number] Hx: RNExercise:  yesDiet:  adequateTobacco/Alcohol:  Former smoker/ drinks sociallyAre you in a relationship that makes you uncomfortable:  noREVIEW OF SYSTEMS:Fever:  NormalWeight:  Body mass index is 28.63 kg/m?Marland KitchenEyes:  NormalEars, Nose, Throat:  NormalThyroid:  NormalBreasts:  NormalHeart:  NormalLungs:  NormalBladder, Kidneys:  NormalGI, Stomach:  NormalVagina, Uterus, Ovaries: NormalBlood:  NormalNerves, Neurology:  NormalMuscles, Bones:  NormalPsychiatric:  NormalHEALTH MAINTENANCE:Mammogram:  Ordered test; last was 8/4/2021Breast Ultrasound:  Not IndicatedColonoscopy:  N/ADEXA Bone Density:  never PROBLEM LIST:Patient Active Problem List  Diagnosis Date Noted ? Allergic asthma  ? Multiple allergies  ? Hyperlipidemia  ? Hypertension  ? Vitamin D deficiency  ? Elevated alkaline phosphatase level  ? Tobacco abuse 09/05/2011 ? Anxiety 09/03/2011 POBHx:OB History Gravida Para Term Preterm AB Living 1 1 1     1  SAB IAB Ectopic Molar Multiple Live Births           1  # Outcome Date GA Lbr Len/2nd Weight Sex Delivery Anes PTL Lv 1 Term    3.37 kg M CS-LTranv EPI N LIV PMHx:Past Medical History: Diagnosis Date ? Allergic asthma   to dog dander ? Anxiety  ? Elevated alkaline phosphatase level   Abd U/S, labs done for eval ? Hyperlipidemia  ? Hypertension  ? Multiple allergies   Dr Wendi Maya [facility] Alllergist - allergic to dogs, cats, grass ? Vitamin D deficiency  PSHx:Past Surgical History: Procedure Laterality Date ? CESAREAN SECTION  2014  x 1 ? LASIK Bilateral  ? SEPTOPLASTY  1990s MEDS:Current Outpatient Medications Medication Sig Dispense Refill ? cholecalciferol (VITAMIN D-3) 50 mcg (2,000 unit) capsule Take 2,000 Units by mouth daily.   ? fish oil-omega-3 fatty acids 1,000 mg capsule Take 2 g by mouth daily.   ? LORazepam (ATIVAN) 0.5 mg tablet Take 1 tablet (0.5 mg total) by mouth daily as needed for anxiety. 30 tablet 0 ? metoprolol succinate XL (TOPROL-XL) 25 mg 24 hr tablet TAKE 1 TABLET (25 MG TOTAL) BY MOUTH DAILY. TAKE WITH OR IMMEDIATELY FOLLOWING A MEAL. 90 tablet 1 ? multivitamin (MULTIVITAMIN) tablet Take 1 tablet by mouth daily.   ? omeprazole (PRILOSEC OTC) 20 MG tablet Take 20 mg by mouth daily.   ? sertraline (ZOLOFT) 50 mg tablet TAKE 1 TABLET BY MOUTH EVERY DAY 90 tablet 3 ? albuterol sulfate 90 mcg/actuation aebs Inhale 180 mcg into the lungs every 4 (four) hours. (Patient not taking: Reported on 01/31/2021)   ? fluticasone furoate-vilanteroL (BREO ELLIPTA) 100-25 mcg/dose blister powder for inhalation Inhale 1 puff into the lungs daily. (Patient not taking: Reported on 01/31/2021)   ? pravastatin (PRAVACHOL) 20 mg tablet TAKE 1 TABLET (20 MG TOTAL) BY MOUTH EVERY EVENING BEFORE DINNER. (Patient not taking: Reported on 01/31/2021) 90 tablet 0 No current facility-administered medications for this visit. ALLERGIES:No Known AllergiesFAMILY AO:ZHYQMV History Problem Relation Age of Onset ? Hypertension Mother  ? High cholesterol Mother  ? Barrett's esophagus Mother  ? Hypertension Sister  ? Lung cancer Father  died at 85, smoker ? Prostate cancer Maternal Grandfather       died in his 61's ? Sarcoma Maternal Aunt 52      died at 50 of leiomyosarcoma ? Colon cancer Maternal Uncle       died in his 4's ? Breast cancer Maternal Aunt       in her 48's ? No Known Problems Brother  OBJECTIVEHEIGHT:   5' 1.5 (1.562 m)     WEIGHT:   69.9 kg	BMI:   Body mass index is 28.63 kg/m?Marland KitchenBLOOD PRESSURE:  126/80EXAM:	General:  no acute distress	Skin:  unremarkable	Neck/Thyroid:  no nodules	Breast:  no LAN, no masses bilaterally	Lungs:  clear to auscultation	Heart:  regular rate and rhythm	Abdomen:  soft, nontender	Vulva:  no lesions	Vagina:  normal	Cervix:  no CMT	Uterus:  mobile, nontender	Adnexae:  no masses or tenderness bilaterally	Rectum:  Rectum and anus are negative	Extremities:  unremarkable, no calf tenderness	CVT: No CVA tendernessPatient exam or treatment required a medical chaperone.Chaperone Flowsheet Documentation:Chaperone Present: Yes, sensitive parts of the examination were performed with chaperone presentChaperone Full Name and Role: Montez Hageman, RMA POC Testing:    Results for orders placed or performed in visit on 08/20/20 Lipid panel Result Value Ref Range  Cholesterol, Total 246 (H) <200 mg/dL  HDL 63 > OR = 50 mg/dL  Triglycerides 621 <308 mg/dL  LDL Cholesterol 657 (H) mg/dL (calc)  Chol/HDL Ratio 3.9 <5.0 (calc)  Non-HDL Cholesterol 183 (H) <130 mg/dL (calc) TSH w/reflex to FT4     (BH GH LMW Q YH) Result Value Ref Range  TSH, 3rd Generation w/ Reflex to FT4 4.05 mIU/L Urinalysis with microscopic     (GH L LMW Q) Result Value Ref Range  Color DARK YELLOW YELLOW  Appearance CLOUDY (A) CLEAR  Specific Gravity, UA 1.027 1.001 - 1.035  pH 6.0 5.0 - 8.0  Glucose, UA NEGATIVE NEGATIVE  Bilirubin NEGATIVE NEGATIVE  Ketones, UA NEGATIVE NEGATIVE  Occult Blood NEGATIVE NEGATIVE  Protein, UA NEGATIVE NEGATIVE  Nitrite, UA NEGATIVE NEGATIVE  Leukocyte Esterase 1+ (A) NEGATIVE  WBC 10-20 (A) < OR = 5 /HPF  RBC NONE SEEN < OR = 2 /HPF  Epithelial Cells, Squamous 20-27 (A) < OR = 5 /HPF  Bacteria NONE SEEN NONE SEEN /HPF  Hyaline Casts, UA NONE SEEN NONE SEEN /LPF Vitamin D, 25-hydroxy Result Value Ref Range  Vitamin D, 25 OH, Total 28 (L) 30 - 100 ng/mL CBC and differential Result Value Ref Range  WBC 6.5 3.8 - 10.8 Thousand/uL  RBC 4.07 3.80 - 5.10 Million/uL  Hemoglobin 12.7 11.7 - 15.5 g/dL  Hematocrit 84.6 96.2 - 45.0 %  MCV 92.9 80.0 - 100.0 fL  MCH 31.2 27.0 - 33.0 pg  MCHC 33.6 32.0 - 36.0 g/dL  RDW 95.2 84.1 - 32.4 %  Platelets 345 140 - 400 Thousand/uL  MPV 9.8 7.5 - 12.5 fL  Neutrophils Absolute 4,076 1,500 - 7,800 cells/uL  Lymphocytes Absolute 1,534 850 - 3,900 cells/uL  Monocytes Absolute 650 200 - 950 cells/uL  Eosinophils Absolute 221 15 - 500 cells/uL  Basophils Absolute 20 0 - 200 cells/uL  Neutrophils 62.7 %  Lymphocytes 23.6 %  Monocytes 10.0 %  Eosinophils 3.4 %  Basophils 0.3 % Comprehensive metabolic panel Result Value Ref Range  Glucose 86 65 - 99 mg/dL  BUN 10 7 - 25 mg/dL  Creatinine 4.01 0.27 - 1.10 mg/dL  eGFR (NON African-American) 107 > OR = 60 mL/min/1.27m2  eGFR (Afr Amer) 124 > OR = 60 mL/min/1.69m2  BUN/Creatinine Ratio NOT APPLICABLE 6 -  22 (calc)  Sodium 138 135 - 146 mmol/L  Potassium 4.2 3.5 - 5.3 mmol/L  Chloride 101 98 - 110 mmol/L  CO2 28 20 - 32 mmol/L  Calcium 9.6 8.6 - 10.2 mg/dL  Protein, Total 7.1 6.1 - 8.1 g/dL  Albumin 4.6 3.6 - 5.1 g/dL  Globulin 2.5 1.9 - 3.7 g/dL (calc)  Albumin/Globulin Ratio 1.8 1.0 - 2.5 (calc)  Bilirubin, Total 0.4 0.2 - 1.2 mg/dL  Alkaline Phosphatase 093 (H) 31 - 125 U/L  AST 41 (H) 10 - 35 U/L  ALT 45 (H) 6 - 29 U/L ASSESSMENTAnnual - unremarkable gyn exam1. Well woman exam with routine gynecological exam  Cytology gyn cases     [facility]) 2. Screening mammogram, encounter for  Mammography Screening  Mammography Screening 	PLAN - Pap smear guidelines discussed.  Pap smear was performed.   - Self Breast Exam - Yearly mammography  - Healthy lifestyle - Encouraged regular exercise  - RTO in one year and as neededI, Montez Hageman, personally scribed for Caprice Red, MD.  Signed: Montez Hageman  Date 9/15/2022Portions of this note were transcribed by a scribe.  I, Opaline Reyburn R. Adilson Grafton, MD, personally performed the history, physical exam and medical decision making and confirmed the accuracy of the information in the transcribed note.Signed: Slayton Lubitz R. Natalija Mavis, MD 01/31/2021 12:17 PM

## 2021-02-09 NOTE — Other
end of menses

## 2021-02-12 NOTE — Other
normal pap negative HPV great news end of menses , endometrial cells not a concern

## 2021-02-18 ENCOUNTER — Encounter: Payer: 59 | Admitting: Nurse Practitioner

## 2021-02-18 MED ORDER — METOPROLOL SUCCINATE ER 25 MG TABLET,EXTENDED RELEASE 24 HR
25 | ORAL_TABLET | ORAL | 2 refills | 90.00000 days | Status: AC
Start: 2021-02-18 — End: 2021-08-19

## 2021-02-27 NOTE — Progress Notes (Signed)
Complete Physical  Assessment and Plan:  Encounter for general adult medical examination with abnormal findings Due Yearly Mammogram and Ultrasounds q 6 months to follow area in Left breast , last mammo 11/27/20  Tachycardia Continue to cut down on caffeine, ETOH, smoking Continue toprol; symptoms have improved/resolved Followed by cardiology; titrating BB with them - denies symptoms of bradycardia which is noted on EKG  Gastroesophageal reflux disease with esophagitis Symptoms well managed without breakthrough Will try to get off PPI given info for taper and pepcid sent in  Thyroid disease Hypothyroidism-check TSH level, continue medications the same, reminded to take on an empty stomach 30-27mins before food.  -     TSH  Obesity Long discussion on diet and exercise Mounjaro is ordered for abnormal glucose, should receive benefit with weight loss as well  Tobacco Use Disorder Pt is ready to quit smoking Used Chantix in the past without adverse side effects  Colon Cancer Screening Referral to GI for colonoscopy  OAB Avoid triggers, continue oxybutynin  Anemia, unspecified type - monitor, continue iron supp with Vitamin C and increase green leafy veggies -     CBC with Differential/Platelet  Vitamin D deficiency -     VITAMIN D 25 Hydroxy (Vit-D Deficiency, Fractures)  Generalized anxiety disorder/insomnia continue medications, stress management techniques discussed, increase water, good sleep hygiene discussed, increase exercise, and increase veggies.   Medication management -     CMP/GFR -     Magnesium  Hyperlipidemia/ Statin Intolerance -     Lipid panel - Continue diet and exercise, if still elevated with labs today will start Zetia  Screening for blood or protein in urine -     Urinalysis, Routine w reflex microscopic -     Microalbumin / creatinine urine ratio  Abnormal Glucose  Started on Mounjaro 2.5 mg SQ QW      -      A1C  Lumbar back pain with  radiculopathy/ Spinal stenosis L4-5 Follows with Dr Tamala Julian  Discussed med's effects and SE's. Screening labs and tests as requested with regular follow-up as recommended. Over 40 minutes of exam, counseling, chart review, and critical decision making was performed this visit.   Future Appointments  Date Time Provider [place]  02/28/2022 10:00 AM Magda Bernheim, NP [facility] None     HPI  50 y.o. female presents for a complete physical. She has Thyroid disease; Vitamin D deficiency; OAB (overactive bladder); Generalized anxiety disorder; GERD (gastroesophageal reflux disease); Insomnia; Hyperlipidemia; Obesity (BMI 30.0-34.9); Tachycardia; Smoker; DDD (degenerative disc disease), lumbar; Lumbar back pain with radiculopathy affecting right lower extremity; and Somatic dysfunction of spine, lumbar on their problem list.   She got married in October 2020, Husband Nunez.  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   She is following with her OB/GYN, Dr. Melba Coon, for OAB on oxybutynin; pt reports she saw [facility] several years ago who r/o previously suspected interstitial cystitis and she reports has done well since with this medication.   She has had ongoing tachycardia/palpitations and was having dyspnea associated with this and was worked up by cardiology; Holter showed some PVCs/PACs, ECHO from 10/2017 was unremarkable. She is on toprol with improvement in symptoms.   She is currently lexapro and has been doing well. Xanax PRN for anxiety; she currently uses 0.25 mg at night if needed to sleep.   She currently takes omeprazole 20 mg daily for GERD and reports well controlled without breakthrough. Denies cough, hoarseness.  BMI is Body mass index is 32.26 kg/m., she has been working on diet and exercise. She aims to get 150 min of walking/running weekly.  Wt Readings from Last 3 Encounters:  02/28/21 212 lb 3.2 oz (96.3 kg)  12/13/20 203 lb (92.1 kg)  10/11/20 202  lb (91.6 kg)   Her blood pressure has been controlled at home, today their BP is BP: 110/72 She does workout. She denies chest pain, shortness of breath, dizziness.   Pt is smoking 2 packs of cigarettes/week, started at age 6.  She is currently ready to quit.  Pt is wanting to lose weight. Unable to take Phentermine due to palpitations. She is interested in [place] for benefits with blood sugar and weight loss.  Has been trying to exercise more and eat more fresh fruits/vegetables and fiber.   She is not on cholesterol medication, she was on crestor but was having pain so stopped but may retry depending these labs. Her cholesterol is not at goal. The cholesterol last visit was:   Lab Results  Component Value Date   CHOL 226 (H) 02/16/2020   HDL 63 02/16/2020   LDLCALC 138 (H) 02/16/2020   TRIG 124 02/16/2020   CHOLHDL 3.6 02/16/2020   Last A1C in the office was:   Lab Results  Component Value Date   HGBA1C 5.4 06/21/2019   Patient is on Vitamin D supplement, 5000 IU daily  Lab Results  Component Value Date   VD25OH 33 02/16/2020   She is on thyroid medication. Her medication was not changed last visit. taking 112 mcg daily on empty stomach. Not on a biotin.  Lab Results  Component Value Date   TSH 0.41 02/16/2020       Current Medications:   Current Outpatient Medications (Endocrine & Metabolic):    levothyroxine (SYNTHROID) 112 MCG tablet, TAKE 1 TAB BY MOUTH DAILY ON AN EMPTY STOMACH WITH ONLY WATER FOR 30 MINUTES & NO ANTACID MEDS, CALCIUM OR MAGNESIUM FOR 4 HOURS AVOID BIOTIN  Current Outpatient Medications (Cardiovascular):    metoprolol succinate (TOPROL-XL) 50 MG 24 hr tablet, Take 1 tablet (50 mg total) by mouth daily. Take with or immediately following a meal.   pravastatin (PRAVACHOL) 40 MG tablet, Take 1 tablet (40 mg total) by mouth every evening. (Patient not taking: Reported on 02/28/2021)  Current Outpatient Medications (Respiratory):     budesonide-formoterol (SYMBICORT) 80-4.5 MCG/ACT inhaler, Inhale 2 puffs daily twice a day, wash your mouth afterwards   fluticasone (FLONASE) 50 MCG/ACT nasal spray, Place 2 sprays into both nostrils daily. (Patient taking differently: Place 2 sprays into both nostrils as needed.)    Current Outpatient Medications (Other):    ALPRAZolam (XANAX) 0.5 MG tablet, TAKE 1/2-1 TAB BY MOUTH UP TO 3 TIMES A DAY AS NEEDED FOR SEVERE ANXIETY OR PANIC. AVOID TAKING DAILY DUE TO RISK OF TOLERANCE AND ADDICTION.MUST LAST 30 DAYS   Cholecalciferol (VITAMIN D PO), Take 5,000 Units by mouth daily.   cyclobenzaprine (FLEXERIL) 10 MG tablet, Take 1 tablet (10 mg total) by mouth every 8 (eight) hours as needed for muscle spasms.   escitalopram (LEXAPRO) 20 MG tablet, Take 1 tablet (20 mg total) by mouth daily for mood   omeprazole (PRILOSEC) 20 MG capsule, Take 1 capsule (20 mg total) by mouth daily as needed.   oxybutynin (DITROPAN-XL) 10 MG 24 hr tablet, TAKE 1 TABLET BY MOUTH DAILY FOR BLADDER CONTROL   gabapentin (NEURONTIN) 100 MG capsule, Take 2 capsules (200 mg total) by  mouth at bedtime. 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 (Menstrual status: Perimenopausal). Pap: Dr. Melba Coon, 02/16/2020  MGM: 11/27/20  DEXA: N/A Colonoscopy: referral made FIT negative 10/2019 EGD: N/A CXR 01/2019  Last Dental Exam: Dr.  Renne Crigler, q13m Last Eye Exam: [facility], Dr. Alease Medina, 2022, appt next month  Patient Care Team: Unk Pinto, MD as PCP - General (Internal  Medicine) Lelon Perla, MD as PCP - Cardiology (Cardiology)  Medical History:  Past Medical History:  Diagnosis Date   Anxiety    Breast hypertrophy 03/2014   GERD (gastroesophageal reflux disease)    Hyperlipidemia    Hypothyroidism    Interstitial cystitis    Allergies Allergies  Allergen Reactions   Amoxicillin Rash and Other (See Comments)    FEVER   Bisoprolol Anxiety   Metoprolol Tartrate Palpitations   Verapamil Rash    SURGICAL HISTORY She  has a past surgical history that includes Breast reduction surgery (Bilateral, 04/18/2014) and Breast biopsy (Left). FAMILY HISTORY Her family history includes CVA (age of onset: 41) in her maternal grandfather; Heart failure in her paternal grandfather; Osteosarcoma (age of onset: 49) in her sister. SOCIAL HISTORY She  reports that she has been smoking cigarettes. She has a 15.00 pack-year smoking history. She has never used smokeless tobacco. She reports current alcohol use of about 10.0 standard drinks per week. She reports that she does not use drugs.  Review of Systems: Review of Systems  Constitutional: Negative.  Negative for chills, fever, malaise/fatigue and weight loss.  HENT: Negative.  Negative for congestion, hearing loss, sinus pain, sore throat and tinnitus.   Eyes: Negative.  Negative for blurred vision and double vision.  Respiratory: Negative.  Negative for cough, hemoptysis, sputum production, shortness of breath and wheezing.   Cardiovascular: Negative.  Negative for chest pain, palpitations, orthopnea, claudication, leg swelling and PND.  Gastrointestinal:  Negative for abdominal pain, blood in stool, constipation, diarrhea, heartburn, melena, nausea and vomiting.  Genitourinary:  Negative for dysuria, flank pain, frequency, hematuria and urgency.  Musculoskeletal: Negative.  Negative for back pain, falls, joint pain, myalgias and neck pain.  Skin: Negative.  Negative for rash.  Neurological: Negative.   Negative for dizziness, tingling, tremors, sensory change, weakness and headaches.  Endo/Heme/Allergies:  Negative for polydipsia. Does not bruise/bleed easily.  Psychiatric/Behavioral:  Negative for depression, memory loss, substance abuse and suicidal ideas. The patient is not nervous/anxious and does not have insomnia.   All other systems reviewed and are negative.  Physical Exam: Estimated body mass index is 32.26 kg/m as calculated from the following:   Height as of this encounter: 5\' 8"  (1.727 m).   Weight as of this encounter: 212 lb 3.2 oz (96.3 kg). BP 110/72   Pulse 86   Temp 97.7 F (36.5 C)   Ht 5\' 8"  (1.727 m)   Wt 212 lb 3.2 oz (96.3 kg)   SpO2 98%   BMI 32.26 kg/m   Wt Readings from Last 3 Encounters:  02/28/21 212 lb 3.2 oz (96.3 kg)  12/13/20 203 lb (92.1 kg)  10/11/20 202 lb (91.6 kg)  General Appearance: Well nourished, in no apparent distress.  Eyes: PERRLA, EOMs, conjunctiva no swelling or erythema, normal fundi and vessels.  Sinuses: No Frontal/maxillary tenderness  ENT/Mouth: Ext aud canals clear, normal light reflex with TMs without erythema, bulging. Good dentition. No erythema, swelling, or exudate on post pharynx.Hearing normal.  Neck: Supple, thyroid normal. No bruits  Respiratory: Respiratory effort normal, BS equal bilaterally without rales, rhonchi, wheezing or stridor.  Cardio: RRR without murmurs, rubs or gallops. Brisk peripheral pulses without edema.  Chest: symmetric, with normal excursions and percussion.  Breasts: defer to GYN Abdomen: Soft, nontender, no guarding, rebound, hernias, masses, or organomegaly.  Lymphatics: Non tender without lymphadenopathy.  Genitourinary: defer to GYN Musculoskeletal: Full ROM all peripheral extremities,5/5 strength, and normal gait.  Skin: Warm, dry without rashes, lesions, ecchymosis. Neuro: Cranial nerves intact, reflexes equal bilaterally. Normal muscle tone, no cerebellar symptoms. Sensation intact.   Psych: Awake and oriented X 3, normal affect, Insight and Judgment appropriate.   EKG: Defer to cardiology  [place] 10:23 AM [facility]

## 2021-02-28 ENCOUNTER — Other Ambulatory Visit (HOSPITAL_COMMUNITY): Payer: Self-pay

## 2021-02-28 ENCOUNTER — Other Ambulatory Visit: Payer: Self-pay | Admitting: Nurse Practitioner

## 2021-02-28 ENCOUNTER — Ambulatory Visit (INDEPENDENT_AMBULATORY_CARE_PROVIDER_SITE_OTHER): Payer: 59 | Admitting: Nurse Practitioner

## 2021-02-28 ENCOUNTER — Encounter: Payer: Self-pay | Admitting: Nurse Practitioner

## 2021-02-28 ENCOUNTER — Other Ambulatory Visit: Payer: Self-pay

## 2021-02-28 VITALS — BP 110/72 | HR 86 | Temp 97.7°F | Ht 68.0 in | Wt 212.2 lb

## 2021-02-28 DIAGNOSIS — Z0000 Encounter for general adult medical examination without abnormal findings: Secondary | ICD-10-CM | POA: Diagnosis not present

## 2021-02-28 DIAGNOSIS — E079 Disorder of thyroid, unspecified: Secondary | ICD-10-CM

## 2021-02-28 DIAGNOSIS — E559 Vitamin D deficiency, unspecified: Secondary | ICD-10-CM

## 2021-02-28 DIAGNOSIS — Z79899 Other long term (current) drug therapy: Secondary | ICD-10-CM

## 2021-02-28 DIAGNOSIS — F5105 Insomnia due to other mental disorder: Secondary | ICD-10-CM

## 2021-02-28 DIAGNOSIS — Z0001 Encounter for general adult medical examination with abnormal findings: Secondary | ICD-10-CM

## 2021-02-28 DIAGNOSIS — R7309 Other abnormal glucose: Secondary | ICD-10-CM

## 2021-02-28 DIAGNOSIS — Z1211 Encounter for screening for malignant neoplasm of colon: Secondary | ICD-10-CM

## 2021-02-28 DIAGNOSIS — N3281 Overactive bladder: Secondary | ICD-10-CM

## 2021-02-28 DIAGNOSIS — Z789 Other specified health status: Secondary | ICD-10-CM

## 2021-02-28 DIAGNOSIS — F411 Generalized anxiety disorder: Secondary | ICD-10-CM

## 2021-02-28 DIAGNOSIS — Z131 Encounter for screening for diabetes mellitus: Secondary | ICD-10-CM

## 2021-02-28 DIAGNOSIS — F17200 Nicotine dependence, unspecified, uncomplicated: Secondary | ICD-10-CM

## 2021-02-28 DIAGNOSIS — M5416 Radiculopathy, lumbar region: Secondary | ICD-10-CM

## 2021-02-28 DIAGNOSIS — E66811 Obesity, class 1: Secondary | ICD-10-CM

## 2021-02-28 DIAGNOSIS — E782 Mixed hyperlipidemia: Secondary | ICD-10-CM | POA: Diagnosis not present

## 2021-02-28 DIAGNOSIS — R000 Tachycardia, unspecified: Secondary | ICD-10-CM

## 2021-02-28 DIAGNOSIS — Z130 Encounter for screening for diseases of the blood and blood-forming organs and certain disorders involving the immune mechanism: Secondary | ICD-10-CM

## 2021-02-28 DIAGNOSIS — Z1389 Encounter for screening for other disorder: Secondary | ICD-10-CM | POA: Diagnosis not present

## 2021-02-28 DIAGNOSIS — F99 Mental disorder, not otherwise specified: Secondary | ICD-10-CM

## 2021-02-28 DIAGNOSIS — K2100 Gastro-esophageal reflux disease with esophagitis, without bleeding: Secondary | ICD-10-CM | POA: Diagnosis not present

## 2021-02-28 DIAGNOSIS — E669 Obesity, unspecified: Secondary | ICD-10-CM

## 2021-02-28 MED ORDER — VARENICLINE TARTRATE 0.5 MG PO TABS
0.5000 mg | ORAL_TABLET | Freq: Two times a day (BID) | ORAL | 0 refills | Status: DC
  Filled 2021-02-28 – 2021-03-08 (×2): qty 60, 30d supply, fill #0

## 2021-02-28 MED ORDER — TIRZEPATIDE 2.5 MG/0.5ML ~~LOC~~ SOAJ
2.5000 mg | SUBCUTANEOUS | 3 refills | Status: DC
  Filled 2021-02-28 – 2021-03-21 (×2): qty 2, 28d supply, fill #0

## 2021-02-28 NOTE — Patient Instructions (Signed)
Tirzepatide Injection What is this medication? TIRZEPATIDE (tir ZEP a tide) treats type 2 diabetes. It works by increasing insulin levels in your body, which decreases your blood sugar (glucose). Changes to diet and exercise are often combined with this medication. This medicine may be used for other purposes; ask your health care provider or pharmacist if you have questions. COMMON BRAND NAME(S): MOUNJARO What should I tell my care team before I take this medication? They need to know if you have any of these conditions: Endocrine tumors (MEN 2) or if someone in your family had these tumors Eye disease, vision problems Gallbladder disease History of pancreatitis Kidney disease Stomach or intestine problems Thyroid cancer or if someone in your family had thyroid cancer An unusual or allergic reaction to tirzepatide, other medications, foods, dyes, or preservatives Pregnant or trying to get pregnant Breast-feeding How should I use this medication? This medication is injected under the skin. You will be taught how to prepare and give it. It is given once every week (every 7 days). Keep taking it unless your health care provider tells you to stop. If you use this medication with insulin, you should inject this medication and the insulin separately. Do not mix them together. Do not give the injections right next to each other. Change (rotate) injection sites with each injection. This medication comes with INSTRUCTIONS FOR USE. Ask your pharmacist for directions on how to use this medication. Read the information carefully. Talk to your pharmacist or care team if you have questions. It is important that you put your used needles and syringes in a special sharps container. Do not put them in a trash can. If you do not have a sharps container, call your pharmacist or care team to get one. A special MedGuide will be given to you by the pharmacist with each prescription and refill. Be sure to read this  information carefully each time. Talk to your care team about the use of this medication in children. Special care may be needed. Overdosage: If you think you have taken too much of this medicine contact a poison control center or emergency room at once. NOTE: This medicine is only for you. Do not share this medicine with others. What if I miss a dose? If you miss a dose, take it as soon as you can unless it is more than 4 days (96 hours) late. If it is more than 4 days late, skip the missed dose. Take the next dose at the normal time. Do not take 2 doses within 3 days of each other. What may interact with this medication? Alcohol containing beverages Antiviral medications for HIV or AIDS Aspirin and aspirin-like medications Beta-blockers like atenolol, metoprolol, propranolol Certain medications for blood pressure, heart disease, irregular heart beat Chromium Clonidine Diuretics Female hormones, such as estrogens or progestins, birth control pills Fenofibrate Gemfibrozil Guanethidine Isoniazid Lanreotide Female hormones or anabolic steroids MAOIs like Carbex, Eldepryl, Marplan, Nardil, and Parnate Medications for weight loss Medications for allergies, asthma, cold, or cough Medications for depression, anxiety, or psychotic disturbances Niacin Nicotine NSAIDs, medications for pain and inflammation, like ibuprofen or naproxen Octreotide Other medications for diabetes, like glyburide, glipizide, or glimepiride Pasireotide Pentamidine Phenytoin Probenecid Quinolone antibiotics such as ciprofloxacin, levofloxacin, ofloxacin Reserpine Some herbal dietary supplements Steroid medications such as prednisone or cortisone Sulfamethoxazole; trimethoprim Thyroid hormones Warfarin This list may not describe all possible interactions. Give your health care provider a list of all the medicines, herbs, non-prescription drugs, or dietary supplements you  use. Also tell them if you smoke, drink  alcohol, or use illegal drugs. Some items may interact with your medicine. What should I watch for while using this medication? Visit your care team for regular checks on your progress. Drink plenty of fluids while taking this medication. Check with your care team if you get an attack of severe diarrhea, nausea, and vomiting. The loss of too much body fluid can make it dangerous for you to take this medication. A test called the HbA1C (A1C) will be monitored. This is a simple blood test. It measures your blood sugar control over the last 2 to 3 months. You will receive this test every 3 to 6 months. Learn how to check your blood sugar. Learn the symptoms of low and high blood sugar and how to manage them. Always carry a quick-source of sugar with you in case you have symptoms of low blood sugar. Examples include hard sugar candy or glucose tablets. Make sure others know that you can choke if you eat or drink when you develop serious symptoms of low blood sugar, such as seizures or unconsciousness. They must get medical help at once. Tell your care team if you have high blood sugar. You might need to change the dose of your medication. If you are sick or exercising more than usual, you might need to change the dose of your medication. Do not skip meals. Ask your care team if you should avoid alcohol. Many nonprescription cough and cold products contain sugar or alcohol. These can affect blood sugar. Pens should never be shared. Even if the needle is changed, sharing may result in passing of viruses like hepatitis or HIV. Wear a medical ID bracelet or chain, and carry a card that describes your disease and details of your medication and dosage times. Birth control may not work properly while you are taking this medication. If you take birth control pills by mouth, your care team may recommend another type of birth control for 4 weeks after you start this medication and for 4 weeks after each increase in  your dose of this medication. Ask your care team which birth control methods you should use. What side effects may I notice from receiving this medication? Side effects that you should report to your care team as soon as possible: Allergic reactions-skin rash, itching, hives, swelling of the face, lips, tongue, or throat Change in vision Dehydration-increased thirst, dry mouth, feeling faint or lightheaded, headache, dark yellow or brown urine Gallbladder problems-severe stomach pain, nausea, vomiting, fever Kidney injury-decrease in the amount of urine, swelling of the ankles, hands, or feet Pancreatitis-severe stomach pain that spreads to your back or gets worse after eating or when touched, fever, nausea, vomiting Thyroid cancer-new mass or lump in the neck, pain or trouble swallowing, trouble breathing, hoarseness Side effects that usually do not require medical attention (report these to your care team if they continue or are bothersome): Constipation Diarrhea Loss of Appetite Nausea Stomach pain Upset stomach Vomiting This list may not describe all possible side effects. Call your doctor for medical advice about side effects. You may report side effects to FDA at 1-800-FDA-1088. Where should I keep my medication? Keep out of the reach of children and pets. Refrigeration (preferred): Store unopened pens in a refrigerator between 2 and 8 degrees C (36 and 46 degrees F). Keep it in the original carton until you are ready to take it. Do not freeze or use if the medication has been frozen.  Protect from light. Get rid of any unused medication after the expiration date on the label. Room Temperature: The pen may be stored at room temperature below 30 degrees C (86 degrees F) for up to a total of 21 days if needed. Protect from light. Avoid exposure to extreme heat. If it is stored at room temperature, throw away any unused medication after 21 days or after it expires, whichever is first. The  pen has glass parts. Handle it carefully. If you drop the pen on a hard surface, do not use it. Use a new pen for your injection. To get rid of medications that are no longer needed or have expired: Take the medication to a medication take-back program. Check with your pharmacy or law enforcement to find a location. If you cannot return the medication, ask your pharmacist or care team how to get rid of this medication safely. NOTE: This sheet is a summary. It may not cover all possible information. If you have questions about this medicine, talk to your doctor, pharmacist, or health care provider.  2022 Elsevier/Gold Standard (2020-10-02 13:57:48)

## 2021-03-01 ENCOUNTER — Other Ambulatory Visit (HOSPITAL_COMMUNITY): Payer: Self-pay

## 2021-03-01 ENCOUNTER — Other Ambulatory Visit: Payer: Self-pay | Admitting: Nurse Practitioner

## 2021-03-01 DIAGNOSIS — E782 Mixed hyperlipidemia: Secondary | ICD-10-CM

## 2021-03-01 LAB — LIPID PANEL
Cholesterol: 247 mg/dL — ABNORMAL HIGH (ref <200)
HDL: 70 mg/dL (ref >=50)
LDL Cholesterol (Calc): 158 mg/dL (calc) — ABNORMAL HIGH
Non-HDL Cholesterol (Calc): 177 mg/dL (calc) — ABNORMAL HIGH (ref <130)
Total CHOL/HDL Ratio: 3.5 (calc) (ref <5.0)
Triglycerides: 82 mg/dL (ref <150)

## 2021-03-01 LAB — CBC WITH DIFFERENTIAL/PLATELET
Absolute Monocytes: 509 cells/uL (ref 200–950)
Basophils Absolute: 69 cells/uL (ref 0–200)
Basophils Relative: 1.3 %
Eosinophils Absolute: 191 cells/uL (ref 15–500)
Eosinophils Relative: 3.6 %
HCT: 42.4 % (ref 35.0–45.0)
Hemoglobin: 14.2 g/dL (ref 11.7–15.5)
Lymphs Abs: 1558 cells/uL (ref 850–3900)
MCH: 30.5 pg (ref 27.0–33.0)
MCHC: 33.5 g/dL (ref 32.0–36.0)
MCV: 91.2 fL (ref 80.0–100.0)
MPV: 11.4 fL (ref 7.5–12.5)
Monocytes Relative: 9.6 %
Neutro Abs: 2973 cells/uL (ref 1500–7800)
Neutrophils Relative %: 56.1 %
Platelets: 329 10*3/uL (ref 140–400)
RBC: 4.65 10*6/uL (ref 3.80–5.10)
RDW: 12.2 % (ref 11.0–15.0)
Total Lymphocyte: 29.4 %
WBC: 5.3 10*3/uL (ref 3.8–10.8)

## 2021-03-01 LAB — MICROALBUMIN / CREATININE URINE RATIO
Creatinine, Urine: 30 mg/dL (ref 20–275)
Microalb, Ur: <0.2 mg/dL

## 2021-03-01 LAB — COMPLETE METABOLIC PANEL WITH GFR
AG Ratio: 1.4 (calc) (ref 1.0–2.5)
ALT: 11 U/L (ref 6–29)
AST: 15 U/L (ref 10–35)
Albumin: 4.2 g/dL (ref 3.6–5.1)
Alkaline phosphatase (APISO): 61 U/L (ref 31–125)
BUN: 15 mg/dL (ref 7–25)
CO2: 30 mmol/L (ref 20–32)
Calcium: 9.6 mg/dL (ref 8.6–10.2)
Chloride: 105 mmol/L (ref 98–110)
Creat: 0.98 mg/dL (ref 0.50–0.99)
Globulin: 3 g/dL (calc) (ref 1.9–3.7)
Glucose, Bld: 92 mg/dL (ref 65–99)
Potassium: 5.6 mmol/L — ABNORMAL HIGH (ref 3.5–5.3)
Sodium: 140 mmol/L (ref 135–146)
Total Bilirubin: 0.3 mg/dL (ref 0.2–1.2)
Total Protein: 7.2 g/dL (ref 6.1–8.1)
eGFR: 72 mL/min/{1.73_m2} (ref >=60)

## 2021-03-01 LAB — URINALYSIS, ROUTINE W REFLEX MICROSCOPIC
Bilirubin Urine: NEGATIVE
Glucose, UA: NEGATIVE
Hgb urine dipstick: NEGATIVE
Ketones, ur: NEGATIVE
Leukocytes,Ua: NEGATIVE
Nitrite: NEGATIVE
Protein, ur: NEGATIVE
Specific Gravity, Urine: 1.007 (ref 1.001–1.035)
pH: 7 (ref 5.0–8.0)

## 2021-03-01 LAB — HEMOGLOBIN A1C
Hgb A1c MFr Bld: 5.2 % of total Hgb (ref <5.7)
Mean Plasma Glucose: 103 mg/dL
eAG (mmol/L): 5.7 mmol/L

## 2021-03-01 LAB — VITAMIN D 25 HYDROXY (VIT D DEFICIENCY, FRACTURES): Vit D, 25-Hydroxy: 53 ng/mL (ref 30–100)

## 2021-03-01 LAB — MAGNESIUM: Magnesium: 2.3 mg/dL (ref 1.5–2.5)

## 2021-03-01 LAB — TSH: TSH: 2.23 mIU/L

## 2021-03-01 MED ORDER — EZETIMIBE 10 MG PO TABS
10.0000 mg | ORAL_TABLET | Freq: Every day | ORAL | 11 refills | Status: DC
  Filled 2021-03-01 – 2021-03-08 (×2): qty 30, 30d supply, fill #0

## 2021-03-07 ENCOUNTER — Ambulatory Visit (AMBULATORY_SURGERY_CENTER): Payer: Self-pay

## 2021-03-07 VITALS — Ht 68.0 in | Wt 206.0 lb

## 2021-03-07 DIAGNOSIS — Z1211 Encounter for screening for malignant neoplasm of colon: Secondary | ICD-10-CM

## 2021-03-07 NOTE — Progress Notes (Signed)
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 Pt is fully vaccinated  for Covid      Due to the COVID-19 pandemic we are asking patients to follow certain guidelines.  Pt aware of COVID protocols and [facility] guidelines

## 2021-03-08 ENCOUNTER — Other Ambulatory Visit (HOSPITAL_COMMUNITY): Payer: Self-pay

## 2021-03-08 ENCOUNTER — Encounter: Payer: Self-pay | Admitting: Gastroenterology

## 2021-03-09 ENCOUNTER — Other Ambulatory Visit (HOSPITAL_COMMUNITY): Payer: Self-pay

## 2021-03-09 MED FILL — Levothyroxine Sodium Tab 112 MCG: ORAL | 90 days supply | Qty: 90 | Fill #1 | Status: AC

## 2021-03-11 ENCOUNTER — Other Ambulatory Visit (HOSPITAL_COMMUNITY): Payer: Self-pay

## 2021-03-13 ENCOUNTER — Encounter: Admit: 2021-03-13 | Payer: PRIVATE HEALTH INSURANCE | Attending: Obstetrics and Gynecology | Primary: Internal Medicine

## 2021-03-22 ENCOUNTER — Other Ambulatory Visit: Payer: Self-pay | Admitting: Nurse Practitioner

## 2021-03-22 ENCOUNTER — Other Ambulatory Visit (HOSPITAL_COMMUNITY): Payer: Self-pay

## 2021-03-22 DIAGNOSIS — R7309 Other abnormal glucose: Secondary | ICD-10-CM

## 2021-03-22 MED ORDER — TIRZEPATIDE 2.5 MG/0.5ML ~~LOC~~ SOAJ
2.5000 mg | SUBCUTANEOUS | 3 refills | Status: DC
Start: 2021-03-22 — End: 2021-04-09

## 2021-03-25 ENCOUNTER — Other Ambulatory Visit: Payer: Self-pay

## 2021-03-25 ENCOUNTER — Ambulatory Visit (AMBULATORY_SURGERY_CENTER): Payer: 59 | Admitting: Gastroenterology

## 2021-03-25 ENCOUNTER — Encounter: Payer: Self-pay | Admitting: Gastroenterology

## 2021-03-25 VITALS — BP 112/81 | HR 71 | Temp 98.6°F | Resp 21 | Ht 68.0 in | Wt 212.0 lb

## 2021-03-25 DIAGNOSIS — D124 Benign neoplasm of descending colon: Secondary | ICD-10-CM

## 2021-03-25 DIAGNOSIS — F419 Anxiety disorder, unspecified: Secondary | ICD-10-CM | POA: Diagnosis not present

## 2021-03-25 DIAGNOSIS — K635 Polyp of colon: Secondary | ICD-10-CM | POA: Diagnosis not present

## 2021-03-25 DIAGNOSIS — D123 Benign neoplasm of transverse colon: Secondary | ICD-10-CM | POA: Diagnosis not present

## 2021-03-25 DIAGNOSIS — D122 Benign neoplasm of ascending colon: Secondary | ICD-10-CM

## 2021-03-25 DIAGNOSIS — Z1211 Encounter for screening for malignant neoplasm of colon: Secondary | ICD-10-CM

## 2021-03-25 DIAGNOSIS — E039 Hypothyroidism, unspecified: Secondary | ICD-10-CM | POA: Diagnosis not present

## 2021-03-25 MED ORDER — SODIUM CHLORIDE 0.9 % IV SOLN: 500.0000 mL | Freq: Once | INTRAVENOUS | Status: DC

## 2021-03-25 NOTE — Progress Notes (Signed)
[facility] History and Physical   Primary Care Physician:  Unk Pinto, MD   Reason for Procedure:  Colorectal cancer screening  Plan:    Screening colonoscopy with possible interventions as needed     HPI: Shari Payne is a very pleasant 50 y.o. femalehere for screening colonoscopy. Denies any nausea, vomiting, abdominal pain, melena or bright red blood per rectum  The risks and benefits as well as alternatives of endoscopic procedure(s) have been discussed and reviewed. All questions answered. The patient agrees to proceed.    Past Medical History:  Diagnosis Date   Anxiety    Breast hypertrophy 03/2014   GERD (gastroesophageal reflux disease)    Hyperlipidemia    Hypothyroidism    Interstitial cystitis     Past Surgical History:  Procedure Laterality Date   BREAST BIOPSY Left    BREAST REDUCTION SURGERY Bilateral 04/18/2014   Procedure: MAMMARY REDUCTION  (BREAST) BILATERAL;  Surgeon: Charlene Brooke, MD;  Location: [facility];  Service: Plastics;  Laterality: Bilateral;    Prior to Admission medications   Medication Sig Start Date End Date Taking? Authorizing Provider  Cholecalciferol (VITAMIN D PO) Take 5,000 Units by mouth daily.   Yes [provider]  cyclobenzaprine (FLEXERIL) 10 MG tablet Take 1 tablet (10 mg total) by mouth every 8 (eight) hours as needed for muscle spasms. 10/01/20  Yes Liane Comber, NP  escitalopram (LEXAPRO) 20 MG tablet Take 1 tablet (20 mg total) by mouth daily for mood 10/18/20  Yes Liane Comber, NP  ezetimibe (ZETIA) 10 MG tablet Take 1 tablet (10 mg total) by mouth daily. 03/01/21  Yes Magda Bernheim, NP  levothyroxine (SYNTHROID) 112 MCG tablet TAKE 1 TAB BY MOUTH DAILY ON AN EMPTY STOMACH WITH ONLY WATER FOR 30 MINUTES & NO ANTACID MEDS, CALCIUM OR MAGNESIUM FOR 4 HOURS AVOID BIOTIN 08/14/20 08/14/21 Yes McClanahan, Kyra, NP  metoprolol succinate (TOPROL-XL) 50 MG 24 hr tablet Take 1  tablet (50 mg total) by mouth daily. Take with or immediately following a meal. 10/01/20 10/01/21 Yes Crenshaw, Denice Bors, MD  omeprazole (PRILOSEC) 20 MG capsule Take 1 capsule (20 mg total) by mouth daily as needed. 12/27/20  Yes Liane Comber, NP  oxybutynin (DITROPAN-XL) 10 MG 24 hr tablet TAKE 1 TABLET BY MOUTH DAILY FOR BLADDER CONTROL 04/30/20 05/05/21 Yes Corbett, Caryl Pina, NP  ALPRAZolam (XANAX) 0.5 MG tablet TAKE 1/2-1 TAB BY MOUTH UP TO 3 TIMES A DAY AS NEEDED FOR SEVERE ANXIETY OR PANIC. AVOID TAKING DAILY DUE TO RISK OF TOLERANCE AND ADDICTION.MUST LAST 30 DAYS 01/01/21 06/30/21  Liane Comber, NP  budesonide-formoterol [facility]) 80-4.5 MCG/ACT inhaler Inhale 2 puffs daily twice a day, wash your mouth afterwards 08/09/19   Vladimir Crofts, PA-C  fluticasone [facility]) 50 MCG/ACT nasal spray Place 2 sprays into both nostrils daily. Patient taking differently: Place 2 sprays into both nostrils as needed. 01/10/18   Brunetta Jeans, PA-C  tirzepatide [facility]) 2.5 MG/0.5ML Pen Inject 2.5 mg into the skin once a week. 02/28/21   Magda Bernheim, NP  tirzepatide [facility]) 2.5 MG/0.5ML Pen Inject 2.5 mg into the skin once a week. 03/22/21   Magda Bernheim, NP  varenicline (CHANTIX) 0.5 MG tablet Take 1 tablet (0.5 mg total) by mouth 2 (two) times daily. 02/28/21   Magda Bernheim, NP    Current Outpatient Medications  Medication Sig Dispense Refill   Cholecalciferol (VITAMIN D PO) Take 5,000 Units by mouth daily.  cyclobenzaprine (FLEXERIL) 10 MG tablet Take 1 tablet (10 mg total) by mouth every 8 (eight) hours as needed for muscle spasms. 60 tablet 1   escitalopram (LEXAPRO) 20 MG tablet Take 1 tablet (20 mg total) by mouth daily for mood 90 tablet 1   ezetimibe (ZETIA) 10 MG tablet Take 1 tablet (10 mg total) by mouth daily. 30 tablet 11   levothyroxine (SYNTHROID) 112 MCG tablet TAKE 1 TAB BY MOUTH DAILY ON AN EMPTY STOMACH WITH ONLY WATER FOR 30 MINUTES & NO ANTACID MEDS, CALCIUM OR MAGNESIUM  FOR 4 HOURS AVOID BIOTIN 90 tablet 3   metoprolol succinate (TOPROL-XL) 50 MG 24 hr tablet Take 1 tablet (50 mg total) by mouth daily. Take with or immediately following a meal. 90 tablet 3   omeprazole (PRILOSEC) 20 MG capsule Take 1 capsule (20 mg total) by mouth daily as needed. 90 capsule 1   oxybutynin (DITROPAN-XL) 10 MG 24 hr tablet TAKE 1 TABLET BY MOUTH DAILY FOR BLADDER CONTROL 90 tablet 3   ALPRAZolam (XANAX) 0.5 MG tablet TAKE 1/2-1 TAB BY MOUTH UP TO 3 TIMES A DAY AS NEEDED FOR SEVERE ANXIETY OR PANIC. AVOID TAKING DAILY DUE TO RISK OF TOLERANCE AND ADDICTION.MUST LAST 30 DAYS 60 tablet 0   budesonide-formoterol (SYMBICORT) 80-4.5 MCG/ACT inhaler Inhale 2 puffs daily twice a day, wash your mouth afterwards 1 Inhaler 1   fluticasone (FLONASE) 50 MCG/ACT nasal spray Place 2 sprays into both nostrils daily. (Patient taking differently: Place 2 sprays into both nostrils as needed.) 16 g 0   tirzepatide (MOUNJARO) 2.5 MG/0.5ML Pen Inject 2.5 mg into the skin once a week. 2 mL 3   tirzepatide (MOUNJARO) 2.5 MG/0.5ML Pen Inject 2.5 mg into the skin once a week. 2 mL 3   varenicline (CHANTIX) 0.5 MG tablet Take 1 tablet (0.5 mg total) by mouth 2 (two) times daily. 60 tablet 0   Current Facility-Administered Medications  Medication Dose Route Frequency Provider Last Rate Last Admin   0.9 %  sodium chloride infusion  500 mL Intravenous Once Mauri Pole, MD        Allergies as of 03/25/2021 - Review Complete 03/25/2021  Allergen Reaction Noted   Amoxicillin Rash and Other (See Comments) 08/18/2013   Bisoprolol Anxiety 04/24/2017   Metoprolol tartrate Palpitations 04/24/2017   Verapamil Rash 04/24/2017    Family History  Problem Relation Age of Onset   Diabetes Father    Osteosarcoma Sister 64   CVA Maternal Grandfather 51   Heart failure Paternal Grandfather        90s   Breast cancer Neg Hx    Colon cancer Neg Hx    Esophageal cancer Neg Hx    Pancreatic cancer Neg Hx     Stomach cancer Neg Hx     Social History   Socioeconomic History   Marital status: Married    Spouse name: Not on file   Number of children: 1   Years of education: Not on file   Highest education level: Not on file  Occupational History   Occupation: RN  Tobacco Use   Smoking status: Every Day    Packs/day: 0.50    Years: 30.00    Pack years: 15.00    Types: Cigarettes   Smokeless tobacco: Never   Tobacco comments:    currently down to 0.25 pack/day or so  Vaping Use   Vaping Use: Never used  Substance and Sexual Activity   Alcohol use: Yes  Alcohol/week: 10.0 standard drinks    Types: 10 Standard drinks or equivalent per week    Comment: weekends   Drug use: No   Sexual activity: Yes    Partners: Male    Birth control/protection: Surgical    Comment: partner had vasectomy  Other Topics Concern   Not on file  Social History Narrative   Not on file   Social Determinants of Health   Financial Resource Strain: Not on file  Food Insecurity: Not on file  Transportation Needs: Not on file  Physical Activity: Not on file  Stress: Not on file  Social Connections: Not on file  Intimate Partner Violence: Not on file    Review of Systems:  All other review of systems negative except as mentioned in the HPI.  Physical Exam: Vital signs in last 24 hours: BP 112/72 (Patient Position: Sitting)   Pulse 95   Temp 98.6 F (37 C)   Ht 5\' 8"  (1.727 m)   Wt 212 lb (96.2 kg)   SpO2 100%   BMI 32.23 kg/m     General:   Alert, NAD Lungs:  Clear .   Heart:  Regular rate and rhythm Abdomen:  Soft, nontender and nondistended. Neuro/Psych:  Alert and cooperative. Normal mood and affect. A and O x 3  Reviewed labs, radiology imaging, old records and pertinent past GI work up  Patient is appropriate for planned procedure(s) and anesthesia in an ambulatory setting   K. Denzil Magnuson , MD [phone]

## 2021-03-25 NOTE — Patient Instructions (Signed)
Handouts given for polyps and diverticulosis.  YOU HAD AN ENDOSCOPIC PROCEDURE TODAY AT THE [facility]:   Refer to the procedure report that was given to you for any specific questions about what was found during the examination.  If the procedure report does not answer your questions, please call your gastroenterologist to clarify.  If you requested that your care partner not be given the details of your procedure findings, then the procedure report has been included in a sealed envelope for you to review at your convenience later.  YOU SHOULD EXPECT: Some feelings of bloating in the abdomen. Passage of more gas than usual.  Walking can help get rid of the air that was put into your GI tract during the procedure and reduce the bloating. If you had a lower endoscopy (such as a colonoscopy or flexible sigmoidoscopy) you may notice spotting of blood in your stool or on the toilet paper. If you underwent a bowel prep for your procedure, you may not have a normal bowel movement for a few days.  Please Note:  You might notice some irritation and congestion in your nose or some drainage.  This is from the oxygen used during your procedure.  There is no need for concern and it should clear up in a day or so.  SYMPTOMS TO REPORT IMMEDIATELY:  Following lower endoscopy (colonoscopy or flexible sigmoidoscopy):  Excessive amounts of blood in the stool  Significant tenderness or worsening of abdominal pains  Swelling of the abdomen that is new, acute  Fever of 100F or higher  For urgent or emergent issues, a gastroenterologist can be reached at any hour by calling [phone]. Do not use MyChart messaging for urgent concerns.    DIET:  We do recommend a small meal at first, but then you may proceed to your regular diet.  Drink plenty of fluids but you should avoid alcoholic beverages for 24 hours.  ACTIVITY:  You should plan to take it easy for the rest of today and you should NOT DRIVE  or use heavy machinery until tomorrow (because of the sedation medicines used during the test).    FOLLOW UP: Our staff will call the number listed on your records 48-72 hours following your procedure to check on you and address any questions or concerns that you may have regarding the information given to you following your procedure. If we do not reach you, we will leave a message.  We will attempt to reach you two times.  During this call, we will ask if you have developed any symptoms of COVID 19. If you develop any symptoms (ie: fever, flu-like symptoms, shortness of breath, cough etc.) before then, please call [phone].  If you test positive for Covid 19 in the 2 weeks post procedure, please call and report this information to us.    If any biopsies were taken you will be contacted by phone or by letter within the next 1-3 weeks.  Please call us at [phone] if you have not heard about the biopsies in 3 weeks.    SIGNATURES/CONFIDENTIALITY: You and/or your care partner have signed paperwork which will be entered into your electronic medical record.  These signatures attest to the fact that that the information above on your After Visit Summary has been reviewed and is understood.  Full responsibility of the confidentiality of this discharge information lies with you and/or your care-partner.  

## 2021-03-25 NOTE — Progress Notes (Signed)
Sedate, gd SR, tolerated procedure well, VSS, report to RN 

## 2021-03-25 NOTE — Op Note (Signed)
[facility] Patient Name: Shari Payne Procedure Date: 03/25/2021 2:48 PM MRN: [id number] Endoscopist: Mauri Pole , MD Age: 50 Referring MD:  Date of Birth: October 21, 1970 Gender: Female Account #: [id number] Procedure:                Colonoscopy Indications:              Screening for colorectal malignant neoplasm Medicines:                Monitored Anesthesia Care Procedure:                Pre-Anesthesia Assessment:                           - Prior to the procedure, a History and Physical                            was performed, and patient medications and                            allergies were reviewed. The patient's tolerance of                            previous anesthesia was also reviewed. The risks                            and benefits of the procedure and the sedation                            options and risks were discussed with the patient.                            All questions were answered, and informed consent                            was obtained. Prior Anticoagulants: The patient has                            taken no previous anticoagulant or antiplatelet                            agents. ASA Grade Assessment: II - A patient with                            mild systemic disease. After reviewing the risks                            and benefits, the patient was deemed in                            satisfactory condition to undergo the procedure.                           After obtaining informed consent, the colonoscope  was passed under direct vision. Throughout the                            procedure, the patient's blood pressure, pulse, and                            oxygen saturations were monitored continuously. The                            PCF-HQ190L Colonoscope was introduced through the                            anus and advanced to the the cecum, identified by                             appendiceal orifice and ileocecal valve. The                            colonoscopy was performed without difficulty. The                            patient tolerated the procedure well. The quality                            of the bowel preparation was adequate to identify                            polyps 6 mm and larger in size. The ileocecal                            valve, appendiceal orifice, and rectum were                            photographed. Scope In: 3:53:34 PM Scope Out: 4:11:47 PM Scope Withdrawal Time: 0 hours 13 minutes 0 seconds  Total Procedure Duration: 0 hours 18 minutes 13 seconds  Findings:                 The perianal and digital rectal examinations were                            normal.                           Two flat polyps were found in the transverse colon                            and ascending colon. The polyps were 6 to 8 mm in                            size. These polyps were removed with a cold snare.                            Resection and retrieval were complete.  A 2 mm polyp was found in the descending colon. The                            polyp was flat. The polyp was removed with a cold                            biopsy forceps. Resection and retrieval were                            complete.                           Scattered small-mouthed diverticula were found in                            the sigmoid colon.                           Non-bleeding external and internal hemorrhoids were                            found during retroflexion. The hemorrhoids were                            medium-sized. Complications:            No immediate complications. Estimated Blood Loss:     Estimated blood loss was minimal. Impression:               - Two 6 to 8 mm polyps in the transverse colon and                            in the ascending colon, removed with a cold snare.                            Resected and  retrieved.                           - One 2 mm polyp in the descending colon, removed                            with a cold biopsy forceps. Resected and retrieved.                           - Diverticulosis in the sigmoid colon.                           - Non-bleeding external and internal hemorrhoids. Recommendation:           - Patient has a contact number available for                            emergencies. The signs and symptoms of potential  delayed complications were discussed with the                            patient. Return to normal activities tomorrow.                            Written discharge instructions were provided to the                            patient.                           - Resume previous diet.                           - Continue present medications.                           - Await pathology results.                           - Repeat colonoscopy in 3 - 5 years for                            surveillance based on pathology results. Mauri Pole, MD 03/25/2021 4:17:31 PM This report has been signed electronically.

## 2021-03-25 NOTE — Progress Notes (Signed)
Pt's states no medical or surgical changes since previsit or office visit.   Check-in-AER  V/S-DT

## 2021-03-25 NOTE — Progress Notes (Signed)
Called to room to assist during endoscopic procedure.  Patient ID and intended procedure confirmed with present staff. Received instructions for my participation in the procedure from the performing physician.  

## 2021-03-26 ENCOUNTER — Encounter: Payer: Self-pay | Admitting: Gastroenterology

## 2021-03-27 ENCOUNTER — Telehealth: Payer: Self-pay | Admitting: *Deleted

## 2021-03-27 NOTE — Telephone Encounter (Signed)
  Follow up Call-  Call back number 03/25/2021  Post procedure Call Back phone  # [phone]  Permission to leave phone message Yes  Some recent data might be hidden     Patient questions:  Do you have a fever, pain , or abdominal swelling? No. Pain Score  0 *  Have you tolerated food without any problems? Yes.    Have you been able to return to your normal activities? Yes.    Do you have any questions about your discharge instructions: Diet   No. Medications  No. Follow up visit  No.  Do you have questions or concerns about your Care? No.  Actions: * If pain score is 4 or above: No action needed, pain <4.  Have you developed a fever since your procedure? no  2.   Have you had an respiratory symptoms (SOB or cough) since your procedure? no  3.   Have you tested positive for COVID 19 since your procedure no  4.   Have you had any family members/close contacts diagnosed with the COVID 19 since your procedure?  no   If yes to any of these questions please route to Joylene John, RN and Joella Prince, RN

## 2021-04-01 ENCOUNTER — Other Ambulatory Visit (HOSPITAL_COMMUNITY): Payer: Self-pay

## 2021-04-05 MED ORDER — ROSUVASTATIN 5 MG TABLET
5 | ORAL_TABLET | Freq: Every evening | ORAL | 3 refills | 90.00000 days | Status: AC
Start: 2021-04-05 — End: 2021-04-30

## 2021-04-09 ENCOUNTER — Other Ambulatory Visit (HOSPITAL_COMMUNITY): Payer: Self-pay

## 2021-04-09 ENCOUNTER — Other Ambulatory Visit: Payer: Self-pay | Admitting: Nurse Practitioner

## 2021-04-09 DIAGNOSIS — R7309 Other abnormal glucose: Secondary | ICD-10-CM

## 2021-04-09 MED ORDER — TIRZEPATIDE 2.5 MG/0.5ML ~~LOC~~ SOAJ
2.5000 mg | SUBCUTANEOUS | 3 refills | Status: DC
  Filled 2021-04-09: qty 2, 28d supply, fill #0
  Filled 2021-05-17: qty 2, 28d supply, fill #1
  Filled 2021-06-18: qty 2, 28d supply, fill #2

## 2021-04-10 ENCOUNTER — Other Ambulatory Visit (HOSPITAL_COMMUNITY): Payer: Self-pay

## 2021-04-15 ENCOUNTER — Other Ambulatory Visit (HOSPITAL_COMMUNITY): Payer: Self-pay

## 2021-04-16 ENCOUNTER — Other Ambulatory Visit: Payer: Self-pay | Admitting: *Deleted

## 2021-04-16 DIAGNOSIS — Z136 Encounter for screening for cardiovascular disorders: Secondary | ICD-10-CM

## 2021-04-18 ENCOUNTER — Other Ambulatory Visit: Payer: Self-pay

## 2021-04-18 ENCOUNTER — Encounter: Payer: Self-pay | Admitting: Gastroenterology

## 2021-04-18 ENCOUNTER — Ambulatory Visit (INDEPENDENT_AMBULATORY_CARE_PROVIDER_SITE_OTHER)
Admission: RE | Admit: 2021-04-18 | Discharge: 2021-04-18 | Disposition: A | Discharge status: Home / Self Care | Payer: Self-pay | Source: Ambulatory Visit | Attending: Cardiology | Admitting: Cardiology

## 2021-04-18 DIAGNOSIS — Z136 Encounter for screening for cardiovascular disorders: Secondary | ICD-10-CM

## 2021-04-19 ENCOUNTER — Inpatient Hospital Stay: Admission: RE | Admit: 2021-04-19 | Payer: 59 | Source: Ambulatory Visit

## 2021-04-30 MED ORDER — ROSUVASTATIN 5 MG TABLET
5 | ORAL_TABLET | ORAL | 3 refills | 90.00000 days | Status: AC
Start: 2021-04-30 — End: 2021-06-11

## 2021-05-02 ENCOUNTER — Other Ambulatory Visit: Payer: Self-pay | Admitting: Adult Health

## 2021-05-02 ENCOUNTER — Other Ambulatory Visit (HOSPITAL_COMMUNITY): Payer: Self-pay

## 2021-05-02 MED ORDER — OXYBUTYNIN CHLORIDE ER 10 MG PO TB24
10.0000 mg | ORAL_TABLET | Freq: Every day | ORAL | 3 refills | Status: DC
  Filled 2021-05-02: qty 90, 90d supply, fill #0
  Filled 2021-08-03: qty 90, 90d supply, fill #1
  Filled 2021-10-30: qty 90, 90d supply, fill #2
  Filled 2022-01-26: qty 90, 90d supply, fill #3

## 2021-05-17 ENCOUNTER — Other Ambulatory Visit: Payer: Self-pay | Admitting: Adult Health

## 2021-05-17 ENCOUNTER — Other Ambulatory Visit (HOSPITAL_COMMUNITY): Payer: Self-pay

## 2021-05-17 MED ORDER — ESCITALOPRAM OXALATE 20 MG PO TABS
20.0000 mg | ORAL_TABLET | Freq: Every day | ORAL | 3 refills | Status: DC
  Filled 2021-05-17: qty 90, 90d supply, fill #0
  Filled 2021-08-20: qty 90, 90d supply, fill #1
  Filled 2021-11-14: qty 90, 90d supply, fill #2
  Filled 2022-02-19: qty 90, 90d supply, fill #3

## 2021-05-21 ENCOUNTER — Other Ambulatory Visit (HOSPITAL_COMMUNITY): Payer: Self-pay

## 2021-05-31 NOTE — Progress Notes (Deleted)
FOLLOW UP  Assessment and Plan:   Tachycardia Continue to cut down on caffeine, ETOH, smoking Continue toprol; symptoms have improved/resolved  Gastroesophageal reflux disease with esophagitis Continue PPI/H2 blocker, diet discussed  Thyroid disease Hypothyroidism-check TSH level, continue medications the same, reminded to take on an empty stomach 30-64mins before food.  -     TSH  Interstitial cystitis Avoid triggers, doing well with oxybutynin  Vitamin D deficiency Near goal at recent check; continue to recommend supplementation for goal of 70-100 Defer vitamin D level  Generalized anxiety disorder/insomnia continue medications, stress management techniques discussed, increase water, good sleep hygiene discussed, increase exercise, and increase veggies.  Medication management -     CMP/GFR -     Magnesium  Hyperlipidemia Working on lifestyle modification, did not tolerate even low dose of crestor, will try low dose of lipitor Discussed with patient and she is in agreement Continue low cholesterol diet and exercise.  Check lipid panel.  -     Lipid panel  Tobacco use Discussed risks associated with tobacco use and advised to reduce or quit Patient is ready to do so and plans to taper and utilize chantix script she has at home; strategies discussed at length; encouraged to commit to quit by date but not ready at this time, "within a few months" Will follow up at the next visit  Obesity Long discussion about weight loss, diet, and exercise Recommended diet heavy in fruits and veggies and low in animal meats, cheeses, and dairy products, appropriate calorie intake Discussed appropriate weight for height  Follow up at next visit  Chronic lumbar pain with RLE radiculopathy Chronic persistent pain with intermittent RLE radicular sx; no red flags Was advised MRI by Dr. Rita Ohara several years back with cost barrier Interested in pursuing today; order placed Did benefit  from PT/dry needling; if MRI doesn't clearly indicate need for neurosurgery will plan to refer back to [facility]   Continue diet and meds as discussed. Further disposition pending results of labs. Discussed med's effects and SE's.   Over 30 minutes of exam, counseling, chart review, and critical decision making was performed.   Future Appointments  Date Time Provider [place]  06/03/2021  2:30 PM Magda Bernheim, NP [facility] None  02/28/2022 10:00 AM Magda Bernheim, NP [facility] None    ----------------------------------------------------------------------------------------------------------------------  HPI 51 y.o. female  presents for follow up on anxiety/insomnia, tachycardia, cholesterol, glucose management, weight and vitamin D deficiency.   She reports long history of lower back pain, hx of sciatica and has seen Dr. Rita Ohara 4-5 years ago, had steroid injection without improvement; was recommended MRI but $750 copay, couldn't afford, was advised weight reduction and told nothing else he could recommend. has been managing with ibuprofen 800 mg in the AM when at work for several years but now having more pain, with catching sensation in lower right back with brief radicular/"electric" pain. She is interested in pursuing MRI.   She saw Dr. McDiarmid for ICS, is now on oxybutynin and reports nearly fully resolved symptoms on med.   Had palpitations,  Holter showed some PVCs/PACs, ECHO from 10/2017 was unremarkable. She is on toprol XL with improvement in symptoms.   She is smoker, since she was 51 y/o, has been reducing, down from1 pack/day to <0.5 pack/day, has chantix, planning to quit soon.   She has had some GERD, states prilosec 20 mg daily, chronic sore throat has resolved. Pepcid didn't help, didn't tolerate attempts to taper.  She has been prescribed xanax PRN for anxiety; she currently uses 0.25 mg at night if needed to sleep, has been reducing. She uses lexapro 10 mg daily.    Recently had abnormal breast screening 03/2020, had [place] 04/26/2021 showed possible fat necrosis, was recommended biopsy but preferred to avoid, planning 6 month follow up US.   BMI is There is no height or weight on file to calculate BMI., she has been working on diet and exercise, doing intermittent fasting, 8:16.  Wt Readings from Last 3 Encounters:  03/25/21 212 lb (96.2 kg)  03/07/21 206 lb (93.4 kg)  02/28/21 212 lb 3.2 oz (96.3 kg)   Her blood pressure has been controlled at home, today their BP is    She does workout. She denies chest pain, shortness of breath, dizziness.    She is not on cholesterol medication. Her cholesterol is not at goal. The cholesterol last visit was:   Lab Results  Component Value Date   CHOL 247 (H) 02/28/2021   HDL 70 02/28/2021   LDLCALC 158 (H) 02/28/2021   TRIG 82 02/28/2021   CHOLHDL 3.5 02/28/2021   Last A1C in the office was:  Lab Results  Component Value Date   HGBA1C 5.2 02/28/2021   She is on thyroid medication. Her medication was not changed last visit.   Lab Results  Component Value Date   TSH 2.23 02/28/2021   Patient is on Vitamin D supplement.   Lab Results  Component Value Date   VD25OH 53 02/28/2021       Current Medications:   Current Outpatient Medications (Endocrine & Metabolic):    levothyroxine (SYNTHROID) 112 MCG tablet, TAKE 1 TAB BY MOUTH DAILY ON AN EMPTY STOMACH WITH ONLY WATER FOR 30 MINUTES & NO ANTACID MEDS, CALCIUM OR MAGNESIUM FOR 4 HOURS AVOID BIOTIN   tirzepatide (MOUNJARO) 2.5 MG/0.5ML Pen, Inject 2.5 mg into the skin once a week.  Current Outpatient Medications (Cardiovascular):    ezetimibe (ZETIA) 10 MG tablet, Take 1 tablet (10 mg total) by mouth daily.   metoprolol succinate (TOPROL-XL) 50 MG 24 hr tablet, Take 1 tablet (50 mg total) by mouth daily. Take with or immediately following a meal.  Current Outpatient Medications (Respiratory):    budesonide-formoterol (SYMBICORT) 80-4.5 MCG/ACT  inhaler, Inhale 2 puffs daily twice a day, wash your mouth afterwards   fluticasone (FLONASE) 50 MCG/ACT nasal spray, Place 2 sprays into both nostrils daily. (Patient taking differently: Place 2 sprays into both nostrils as needed.)    Current Outpatient Medications (Other):    ALPRAZolam (XANAX) 0.5 MG tablet, TAKE 1/2-1 TAB BY MOUTH UP TO 3 TIMES A DAY AS NEEDED FOR SEVERE ANXIETY OR PANIC. AVOID TAKING DAILY DUE TO RISK OF TOLERANCE AND ADDICTION.MUST LAST 30 DAYS   Cholecalciferol (VITAMIN D PO), Take 5,000 Units by mouth daily.   cyclobenzaprine (FLEXERIL) 10 MG tablet, Take 1 tablet (10 mg total) by mouth every 8 (eight) hours as needed for muscle spasms.   escitalopram (LEXAPRO) 20 MG tablet, Take 1 tablet (20 mg total) by mouth daily for mood   omeprazole (PRILOSEC) 20 MG capsule, Take 1 capsule (20 mg total) by mouth daily as needed.   oxybutynin (DITROPAN-XL) 10 MG 24 hr tablet, Take 1 tablet (10 mg total) by mouth daily for bladder control   varenicline (CHANTIX) 0.5 MG tablet, Take 1 tablet (0.5 mg total) by mouth 2 (two) times daily.   Allergies:  Allergies  Allergen Reactions   Amoxicillin Rash and  Other (See Comments)    FEVER   Bisoprolol Anxiety   Metoprolol Tartrate Palpitations   Verapamil Rash     Medical History:  Past Medical History:  Diagnosis Date   Anxiety    Breast hypertrophy 03/2014   GERD (gastroesophageal reflux disease)    Hyperlipidemia    Hypothyroidism    Interstitial cystitis    Family history- Reviewed and unchanged Social history- Reviewed and unchanged   Review of Systems:  Review of Systems  Constitutional:  Negative for malaise/fatigue and weight loss.  HENT:  Negative for hearing loss and tinnitus.   Eyes:  Negative for blurred vision and double vision.  Respiratory:  Negative for cough, shortness of breath and wheezing.   Cardiovascular:  Negative for chest pain, palpitations, orthopnea, claudication and leg swelling.   Gastrointestinal:  Negative for abdominal pain, blood in stool, constipation, diarrhea, heartburn, melena, nausea and vomiting.  Genitourinary: Negative.   Musculoskeletal:  Positive for back pain (chronic lumbar, worse with sitting to standing, intermittent R radicular pain). Negative for joint pain and myalgias.  Skin:  Negative for rash.  Neurological:  Negative for dizziness, tingling, sensory change, weakness and headaches.  Endo/Heme/Allergies:  Negative for polydipsia.  Psychiatric/Behavioral:  The patient is nervous/anxious and has insomnia.   All other systems reviewed and are negative.   Physical Exam: There were no vitals taken for this visit. Wt Readings from Last 3 Encounters:  03/25/21 212 lb (96.2 kg)  03/07/21 206 lb (93.4 kg)  02/28/21 212 lb 3.2 oz (96.3 kg)   General Appearance: Well nourished, in no apparent distress. Eyes: PERRLA, EOMs, conjunctiva no swelling or erythema Sinuses: No Frontal/maxillary tenderness ENT/Mouth: Ext aud canals clear, TMs without erythema, bulging. No erythema, swelling, or exudate on post pharynx.  Tonsils not swollen or erythematous. Hearing normal.  Neck: Supple, thyroid normal.  Respiratory: Respiratory effort normal, BS equal bilaterally without rales, rhonchi, wheezing or stridor.  Cardio: RRR with no MRGs. Brisk peripheral pulses without edema.  Abdomen: Soft, + BS.  Non tender, no guarding, rebound, hernias, masses. Lymphatics: Non tender without lymphadenopathy.  Musculoskeletal: Full ROM, 5/5 strength, Normal gait. No midline lumbar tenderness, mild R SI tenderness, + R straight leg raise Skin: Warm, dry without rashes, lesions, ecchymosis.  Neuro: Cranial nerves intact. No cerebellar symptoms. BLE sensation intact Psych: Awake and oriented X 3, normal affect, Insight and Judgment appropriate.    Magda Bernheim, NP 10:43 AM [facility]

## 2021-06-03 ENCOUNTER — Ambulatory Visit: Payer: Federal, State, Local not specified - PPO | Admitting: Nurse Practitioner

## 2021-06-03 DIAGNOSIS — E079 Disorder of thyroid, unspecified: Secondary | ICD-10-CM

## 2021-06-03 DIAGNOSIS — E669 Obesity, unspecified: Secondary | ICD-10-CM

## 2021-06-03 DIAGNOSIS — F5105 Insomnia due to other mental disorder: Secondary | ICD-10-CM

## 2021-06-03 DIAGNOSIS — M545 Low back pain, unspecified: Secondary | ICD-10-CM

## 2021-06-03 DIAGNOSIS — E782 Mixed hyperlipidemia: Secondary | ICD-10-CM

## 2021-06-03 DIAGNOSIS — E559 Vitamin D deficiency, unspecified: Secondary | ICD-10-CM

## 2021-06-03 DIAGNOSIS — F411 Generalized anxiety disorder: Secondary | ICD-10-CM

## 2021-06-03 DIAGNOSIS — K2100 Gastro-esophageal reflux disease with esophagitis, without bleeding: Secondary | ICD-10-CM

## 2021-06-03 DIAGNOSIS — R000 Tachycardia, unspecified: Secondary | ICD-10-CM

## 2021-06-03 DIAGNOSIS — R7309 Other abnormal glucose: Secondary | ICD-10-CM

## 2021-06-03 DIAGNOSIS — F17200 Nicotine dependence, unspecified, uncomplicated: Secondary | ICD-10-CM

## 2021-06-05 ENCOUNTER — Other Ambulatory Visit: Payer: Self-pay | Admitting: Adult Health

## 2021-06-05 MED FILL — Levothyroxine Sodium Tab 112 MCG: ORAL | 90 days supply | Qty: 90 | Fill #2 | Status: AC

## 2021-06-06 ENCOUNTER — Other Ambulatory Visit (HOSPITAL_COMMUNITY): Payer: Self-pay

## 2021-06-06 MED ORDER — ALPRAZOLAM 0.5 MG PO TABS
0.2500 mg | ORAL_TABLET | Freq: Three times a day (TID) | ORAL | 0 refills | Status: DC | PRN
  Filled 2021-06-06: qty 60, 30d supply, fill #0

## 2021-06-07 ENCOUNTER — Encounter: Admit: 2021-06-07 | Payer: PRIVATE HEALTH INSURANCE | Primary: Internal Medicine

## 2021-06-11 ENCOUNTER — Encounter: Admit: 2021-06-11 | Payer: Managed Care (Private) | Attending: Internal Medicine | Primary: Internal Medicine

## 2021-06-11 ENCOUNTER — Encounter: Admit: 2021-06-11 | Payer: PRIVATE HEALTH INSURANCE | Attending: Internal Medicine | Primary: Internal Medicine

## 2021-06-11 MED ORDER — FLUTICASONE 100 MCG-SALMETEROL 50 MCG/DOSE BLISTR POWDR FOR INHALATION
100-50 | Freq: Two times a day (BID) | RESPIRATORY_TRACT | 4.00 refills | 30.00000 days | Status: AC
Start: 2021-06-11 — End: ?

## 2021-06-11 MED ORDER — ROSUVASTATIN 5 MG TABLET
5 | ORAL_TABLET | Freq: Every day | ORAL | 4 refills | 90.00000 days | Status: AC
Start: 2021-06-11 — End: 2022-06-06

## 2021-06-17 NOTE — Progress Notes (Signed)
FOLLOW UP  Assessment and Plan:   Tachycardia Continue to cut down on caffeine, ETOH, smoking Continue toprol; symptoms have improved/resolved  Gastroesophageal reflux disease with esophagitis Continue PPI/H2 blocker, diet discussed  Thyroid disease Hypothyroidism-check TSH level, continue medications the same, reminded to take on an empty stomach 30-20mins before food.  -     TSH  Interstitial cystitis Avoid triggers, doing well with oxybutynin  Vitamin D deficiency Near goal at recent check; continue to recommend supplementation for goal of 70-100 Defer vitamin D level  Generalized anxiety disorder/insomnia continue medications, stress management techniques discussed, increase water, good sleep hygiene discussed, increase exercise, and increase veggies.  Medication management -     CMP/GFR -     Magnesium  Hyperlipidemia/Statin intolerance Working on lifestyle modification, did not tolerate even low dose of crestor, will try low dose of lipitor Discussed with patient and she is in agreement Continue low cholesterol diet and exercise.  Check lipid panel.  -     Lipid panel  Tobacco use Discussed risks associated with tobacco use and advised to reduce or quit Patient is not currently ready to focus on weight loss, Chantix made her sick- trying to limit to less than 8 cigarettes a day Will follow up at the next visit  Obesity Continue Mounjaro and diet/exercise Long discussion about weight loss, diet, and exercise Recommended diet heavy in fruits and veggies and low in animal meats, cheeses, and dairy products, appropriate calorie intake Discussed appropriate weight for height  Follow up at next visit     Continue diet and meds as discussed. Further disposition pending results of labs. Discussed med's effects and SE's.   Over 30 minutes of exam, counseling, chart review, and critical decision making was performed.   Future Appointments  Date Time Provider  [place]  02/28/2022 10:00 AM Magda Bernheim, NP [facility] None    ----------------------------------------------------------------------------------------------------------------------  HPI 51 y.o. female  presents for follow up on anxiety/insomnia, tachycardia, cholesterol, glucose management, weight and vitamin D deficiency.    She saw Dr. McDiarmid for ICS, is now on oxybutynin and reports nearly fully resolved symptoms on med.   Had palpitations,  Holter showed some PVCs/PACs, ECHO from 10/2017 was unremarkable. She is on toprol XL with improvement in symptoms.   She is smoker, since she was 51 y/o, has been reducing, down from1 pack/day to <0.5 pack/day,has tried Chantix in the past and it made her sick.  So is trying to limit cigarettes to 8/day  She has had some GERD, states prilosec 20 mg daily, pepcid did not control symptoms  She has been prescribed xanax PRN for anxiety; she currently uses 0.25 mg at night if needed to sleep, has been reducing. She uses lexapro 10 mg daily. Symptoms are well controlled  Recently had abnormal breast screening 03/2020, had [place] 04/26/2020 showed possible fat necrosis, mammogram 11/27/20 and Left breast U/S showed stability and is to have a repeat Mammogram next week.  BMI is Body mass index is 28.83 kg/m., she has been working on diet and exercise, doing intermittent fasting, 8:16. She is currently on Mounjaro and is down 23 pounds from last visit Wt Readings from Last 3 Encounters:  06/18/21 189 lb 9.6 oz (86 kg)  03/25/21 212 lb (96.2 kg)  03/07/21 206 lb (93.4 kg)   Her blood pressure has been controlled at home, today their BP is BP: 102/78  She does workout. She denies chest pain, shortness of breath, dizziness.    She  is not on cholesterol medication. Her cholesterol is not at goal. The cholesterol last visit was:   Lab Results  Component Value Date   CHOL 247 (H) 02/28/2021   HDL 70 02/28/2021   LDLCALC 158 (H) 02/28/2021    TRIG 82 02/28/2021   CHOLHDL 3.5 02/28/2021   Last A1C in the office was:  Lab Results  Component Value Date   HGBA1C 5.2 02/28/2021   She is on thyroid medication. Her medication was not changed last visit.   Lab Results  Component Value Date   TSH 2.23 02/28/2021   Patient is on Vitamin D supplement.   Lab Results  Component Value Date   VD25OH 53 02/28/2021       Current Medications:   Current Outpatient Medications (Endocrine & Metabolic):    levothyroxine (SYNTHROID) 112 MCG tablet, TAKE 1 TAB BY MOUTH DAILY ON AN EMPTY STOMACH WITH ONLY WATER FOR 30 MINUTES & NO ANTACID MEDS, CALCIUM OR MAGNESIUM FOR 4 HOURS AVOID BIOTIN   tirzepatide (MOUNJARO) 2.5 MG/0.5ML Pen, Inject 2.5 mg into the skin once a week.  Current Outpatient Medications (Cardiovascular):    ezetimibe (ZETIA) 10 MG tablet, Take 1 tablet (10 mg total) by mouth daily.   metoprolol succinate (TOPROL-XL) 50 MG 24 hr tablet, Take 1 tablet (50 mg total) by mouth daily. Take with or immediately following a meal.  Current Outpatient Medications (Respiratory):    budesonide-formoterol (SYMBICORT) 80-4.5 MCG/ACT inhaler, Inhale 2 puffs daily twice a day, wash your mouth afterwards   fluticasone (FLONASE) 50 MCG/ACT nasal spray, Place 2 sprays into both nostrils daily. (Patient taking differently: Place 2 sprays into both nostrils as needed.)    Current Outpatient Medications (Other):    ALPRAZolam (XANAX) 0.5 MG tablet, Take 0.5-1 tablets (0.25-0.5 mg total) by mouth 3 (three) times daily as needed for severe anxiety or panic. AVOID TAKING DAILY DUE TO RISK OF TOLERANCE AND ADDICTION . Must last 30 days   Cholecalciferol (VITAMIN D PO), Take 5,000 Units by mouth daily.   cyclobenzaprine (FLEXERIL) 10 MG tablet, Take 1 tablet (10 mg total) by mouth every 8 (eight) hours as needed for muscle spasms.   escitalopram (LEXAPRO) 20 MG tablet, Take 1 tablet (20 mg total) by mouth daily for mood   omeprazole  (PRILOSEC) 20 MG capsule, Take 1 capsule (20 mg total) by mouth daily as needed.   oxybutynin (DITROPAN-XL) 10 MG 24 hr tablet, Take 1 tablet (10 mg total) by mouth daily for bladder control   Allergies:  Allergies  Allergen Reactions   Amoxicillin Rash and Other (See Comments)    FEVER   Bisoprolol Anxiety   Metoprolol Tartrate Palpitations   Verapamil Rash     Medical History:  Past Medical History:  Diagnosis Date   Anxiety    Breast hypertrophy 03/2014   GERD (gastroesophageal reflux disease)    Hyperlipidemia    Hypothyroidism    Interstitial cystitis    Family history- Reviewed and unchanged Social history- Reviewed and unchanged   Review of Systems:  Review of Systems  Constitutional:  Negative for malaise/fatigue and weight loss.  HENT:  Negative for hearing loss and tinnitus.   Eyes:  Negative for blurred vision and double vision.  Respiratory:  Negative for cough, shortness of breath and wheezing.   Cardiovascular:  Negative for chest pain, palpitations, orthopnea, claudication and leg swelling.  Gastrointestinal:  Negative for abdominal pain, blood in stool, constipation, diarrhea, heartburn, melena, nausea and vomiting.  Genitourinary: Negative.  Musculoskeletal:  Negative for back pain, joint pain and myalgias.  Skin:  Negative for rash.  Neurological:  Negative for dizziness, tingling, sensory change, weakness and headaches.  Endo/Heme/Allergies:  Negative for polydipsia.  Psychiatric/Behavioral:  The patient is nervous/anxious. The patient does not have insomnia.   All other systems reviewed and are negative.   Physical Exam: BP 102/78    Pulse 74    Temp (!) 97.5 F (36.4 C)    Wt 189 lb 9.6 oz (86 kg)    SpO2 99%    BMI 28.83 kg/m  Wt Readings from Last 3 Encounters:  06/18/21 189 lb 9.6 oz (86 kg)  03/25/21 212 lb (96.2 kg)  03/07/21 206 lb (93.4 kg)   General Appearance: Well nourished, in no apparent distress. Eyes: PERRLA, EOMs,  conjunctiva no swelling or erythema Sinuses: No Frontal/maxillary tenderness ENT/Mouth: Ext aud canals clear, TMs without erythema, bulging. No erythema, swelling, or exudate on post pharynx.  Tonsils not swollen or erythematous. Hearing normal.  Neck: Supple, thyroid normal.  Respiratory: Respiratory effort normal, BS equal bilaterally without rales, rhonchi, wheezing or stridor.  Cardio: RRR with no MRGs. Brisk peripheral pulses without edema.  Abdomen: Soft, + BS.  Non tender, no guarding, rebound, hernias, masses. Lymphatics: Non tender without lymphadenopathy.  Musculoskeletal: Full ROM, 5/5 strength, Normal gait. No midline lumbar tenderness, mild R SI tenderness, + R straight leg raise Skin: Warm, dry without rashes, lesions, ecchymosis.  Neuro: Cranial nerves intact. No cerebellar symptoms. BLE sensation intact Psych: Awake and oriented X 3, normal affect, Insight and Judgment appropriate.    Magda Bernheim, NP 11:47 AM [facility]

## 2021-06-18 ENCOUNTER — Other Ambulatory Visit (HOSPITAL_COMMUNITY): Payer: Self-pay

## 2021-06-18 ENCOUNTER — Telehealth: Payer: Self-pay

## 2021-06-18 ENCOUNTER — Encounter: Payer: Self-pay | Admitting: Nurse Practitioner

## 2021-06-18 ENCOUNTER — Other Ambulatory Visit: Payer: Self-pay

## 2021-06-18 ENCOUNTER — Ambulatory Visit (INDEPENDENT_AMBULATORY_CARE_PROVIDER_SITE_OTHER): Payer: Federal, State, Local not specified - PPO | Admitting: Nurse Practitioner

## 2021-06-18 VITALS — BP 102/78 | HR 74 | Temp 97.5°F | Ht 68.0 in | Wt 189.6 lb

## 2021-06-18 DIAGNOSIS — R000 Tachycardia, unspecified: Secondary | ICD-10-CM | POA: Diagnosis not present

## 2021-06-18 DIAGNOSIS — E782 Mixed hyperlipidemia: Secondary | ICD-10-CM | POA: Diagnosis not present

## 2021-06-18 DIAGNOSIS — F17200 Nicotine dependence, unspecified, uncomplicated: Secondary | ICD-10-CM

## 2021-06-18 DIAGNOSIS — E559 Vitamin D deficiency, unspecified: Secondary | ICD-10-CM

## 2021-06-18 DIAGNOSIS — E079 Disorder of thyroid, unspecified: Secondary | ICD-10-CM | POA: Diagnosis not present

## 2021-06-18 DIAGNOSIS — Z789 Other specified health status: Secondary | ICD-10-CM

## 2021-06-18 DIAGNOSIS — N3281 Overactive bladder: Secondary | ICD-10-CM | POA: Diagnosis not present

## 2021-06-18 DIAGNOSIS — E669 Obesity, unspecified: Secondary | ICD-10-CM

## 2021-06-18 DIAGNOSIS — F411 Generalized anxiety disorder: Secondary | ICD-10-CM

## 2021-06-18 DIAGNOSIS — K2100 Gastro-esophageal reflux disease with esophagitis, without bleeding: Secondary | ICD-10-CM | POA: Diagnosis not present

## 2021-06-18 DIAGNOSIS — M5416 Radiculopathy, lumbar region: Secondary | ICD-10-CM

## 2021-06-18 LAB — CBC WITH DIFFERENTIAL/PLATELET
Absolute Monocytes: 468 cells/uL (ref 200–950)
Basophils Absolute: 39 cells/uL (ref 0–200)
Basophils Relative: 0.7 %
Eosinophils Absolute: 182 cells/uL (ref 15–500)
Eosinophils Relative: 3.3 %
HCT: 42.3 % (ref 35.0–45.0)
Hemoglobin: 13.9 g/dL (ref 11.7–15.5)
Lymphs Abs: 1513 cells/uL (ref 850–3900)
MCH: 29.7 pg (ref 27.0–33.0)
MCHC: 32.9 g/dL (ref 32.0–36.0)
MCV: 90.4 fL (ref 80.0–100.0)
MPV: 12.4 fL (ref 7.5–12.5)
Monocytes Relative: 8.5 %
Neutro Abs: 3300 cells/uL (ref 1500–7800)
Neutrophils Relative %: 60 %
Platelets: 287 10*3/uL (ref 140–400)
RBC: 4.68 10*6/uL (ref 3.80–5.10)
RDW: 12.5 % (ref 11.0–15.0)
Total Lymphocyte: 27.5 %
WBC: 5.5 10*3/uL (ref 3.8–10.8)

## 2021-06-18 LAB — COMPLETE METABOLIC PANEL WITH GFR
AG Ratio: 1.3 (calc) (ref 1.0–2.5)
ALT: 16 U/L (ref 6–29)
AST: 24 U/L (ref 10–35)
Albumin: 4.1 g/dL (ref 3.6–5.1)
Alkaline phosphatase (APISO): 54 U/L (ref 31–125)
BUN: 9 mg/dL (ref 7–25)
CO2: 29 mmol/L (ref 20–32)
Calcium: 9.3 mg/dL (ref 8.6–10.2)
Chloride: 103 mmol/L (ref 98–110)
Creat: 0.99 mg/dL (ref 0.50–0.99)
Globulin: 3.1 g/dL (calc) (ref 1.9–3.7)
Glucose, Bld: 86 mg/dL (ref 65–99)
Potassium: 4.2 mmol/L (ref 3.5–5.3)
Sodium: 139 mmol/L (ref 135–146)
Total Bilirubin: 0.7 mg/dL (ref 0.2–1.2)
Total Protein: 7.2 g/dL (ref 6.1–8.1)
eGFR: 70 mL/min/{1.73_m2} (ref >=60)

## 2021-06-18 LAB — LIPID PANEL
Cholesterol: 211 mg/dL — ABNORMAL HIGH (ref <200)
HDL: 78 mg/dL (ref >=50)
LDL Cholesterol (Calc): 119 mg/dL (calc) — ABNORMAL HIGH
Non-HDL Cholesterol (Calc): 133 mg/dL (calc) — ABNORMAL HIGH (ref <130)
Total CHOL/HDL Ratio: 2.7 (calc) (ref <5.0)
Triglycerides: 58 mg/dL (ref <150)

## 2021-06-18 LAB — TSH: TSH: 0.5 mIU/L

## 2021-06-18 MED ORDER — EZETIMIBE 10 MG PO TABS
10.0000 mg | ORAL_TABLET | Freq: Every day | ORAL | 3 refills | Status: DC
  Filled 2021-06-18: qty 90, 90d supply, fill #0
  Filled 2021-09-22: qty 90, 90d supply, fill #1
  Filled 2021-12-18: qty 90, 90d supply, fill #2

## 2021-06-18 NOTE — Telephone Encounter (Signed)
Patient said she wanted to try the Sax[place].

## 2021-06-19 ENCOUNTER — Other Ambulatory Visit (HOSPITAL_COMMUNITY): Payer: Self-pay

## 2021-06-19 ENCOUNTER — Other Ambulatory Visit: Payer: Self-pay | Admitting: Nurse Practitioner

## 2021-06-19 DIAGNOSIS — E669 Obesity, unspecified: Secondary | ICD-10-CM

## 2021-06-19 MED ORDER — INSULIN PEN NEEDLE 32G X 4 MM MISC
1 refills | Status: DC
  Filled 2021-06-19 – 2021-07-01 (×2): qty 100, 90d supply, fill #0

## 2021-06-19 MED ORDER — SAXENDA 18 MG/3ML ~~LOC~~ SOPN
3.0000 mg | PEN_INJECTOR | Freq: Every day | SUBCUTANEOUS | 3 refills | Status: DC
  Filled 2021-06-19 – 2021-07-01 (×3): qty 15, 30d supply, fill #0

## 2021-06-19 NOTE — Telephone Encounter (Signed)
Please provide instructions to patient that this is a daily injection, will need to put needle on pen daily and dial up dose.  Start with 0.6 mg daily and increase dose until she feels it is controlling her appetite- says 3 mg daily on script so she can get the most pens with each fill.  Keep in refrigerator until she starts to use the pen then can keep at room temperature

## 2021-06-20 NOTE — Telephone Encounter (Signed)
She does have dyslipidemia

## 2021-06-24 ENCOUNTER — Telehealth: Payer: Self-pay

## 2021-06-24 NOTE — Telephone Encounter (Signed)
Prior Auth for [place] approved through 12/17/21.

## 2021-06-25 DIAGNOSIS — N6082 Other benign mammary dysplasias of left breast: Secondary | ICD-10-CM | POA: Diagnosis not present

## 2021-06-25 LAB — HM MAMMOGRAPHY

## 2021-06-29 ENCOUNTER — Other Ambulatory Visit: Payer: Self-pay | Admitting: Adult Health

## 2021-06-29 DIAGNOSIS — K2100 Gastro-esophageal reflux disease with esophagitis, without bleeding: Secondary | ICD-10-CM

## 2021-06-29 MED ORDER — OMEPRAZOLE 20 MG PO CPDR
20.0000 mg | DELAYED_RELEASE_CAPSULE | Freq: Every day | ORAL | 1 refills | Status: DC | PRN
  Filled 2021-06-29: qty 90, 90d supply, fill #0
  Filled 2021-09-24: qty 90, 90d supply, fill #1

## 2021-07-01 ENCOUNTER — Other Ambulatory Visit (HOSPITAL_COMMUNITY): Payer: Self-pay

## 2021-07-03 ENCOUNTER — Encounter: Payer: Self-pay | Admitting: Internal Medicine

## 2021-07-04 ENCOUNTER — Encounter: Payer: Self-pay | Admitting: Nurse Practitioner

## 2021-08-05 ENCOUNTER — Other Ambulatory Visit (HOSPITAL_COMMUNITY): Payer: Self-pay

## 2021-08-15 ENCOUNTER — Ambulatory Visit: Payer: Federal, State, Local not specified - PPO | Admitting: Nurse Practitioner

## 2021-08-18 ENCOUNTER — Encounter: Admit: 2021-08-18 | Payer: PRIVATE HEALTH INSURANCE | Attending: Internal Medicine | Primary: Internal Medicine

## 2021-08-19 MED ORDER — SERTRALINE 50 MG TABLET
50 | ORAL_TABLET | ORAL | 4 refills | 90.00000 days | Status: AC
Start: 2021-08-19 — End: 2022-09-15

## 2021-08-19 MED ORDER — METOPROLOL SUCCINATE ER 25 MG TABLET,EXTENDED RELEASE 24 HR
25 | ORAL_TABLET | ORAL | 2 refills | 90.00000 days | Status: AC
Start: 2021-08-19 — End: 2021-12-23

## 2021-08-21 ENCOUNTER — Other Ambulatory Visit (HOSPITAL_COMMUNITY): Payer: Self-pay

## 2021-08-29 ENCOUNTER — Encounter: Payer: Self-pay | Admitting: Nurse Practitioner

## 2021-09-02 ENCOUNTER — Other Ambulatory Visit (HOSPITAL_COMMUNITY): Payer: Self-pay

## 2021-09-02 ENCOUNTER — Other Ambulatory Visit: Payer: Self-pay | Admitting: Nurse Practitioner

## 2021-09-02 DIAGNOSIS — E669 Obesity, unspecified: Secondary | ICD-10-CM

## 2021-09-02 MED ORDER — WEGOVY 0.5 MG/0.5ML ~~LOC~~ SOAJ
0.5000 mg | SUBCUTANEOUS | 1 refills | Status: DC
  Filled 2021-09-02 – 2021-09-03 (×2): qty 2, 28d supply, fill #0
  Filled 2021-09-30: qty 2, 28d supply, fill #1

## 2021-09-02 NOTE — Progress Notes (Signed)
?Shari Payne D.O. ?[facility] ?Shari Payne ?Phone: [phone] ?Subjective:   ?I, Shari Payne, am serving as a scribe for Dr. Hulan Payne. ? ?This visit occurred during the SARS-CoV-2 public health emergency.  Safety protocols were in place, including screening questions prior to the visit, additional usage of staff PPE, and extensive cleaning of exam room while observing appropriate contact time as indicated for disinfecting solutions.  ? ? ?I'm seeing this patient by the request  of:  Shari Pinto, MD ? ?CC: right elbow pain  ? ?ACZ:[id number]  ?Last seen July 2022 ? ?Shari Payne is a 51 y.o. female coming in with complaint of right elbow pain. Last seen in July 2022 for OMT. 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 Pain is constant but worse with movement. Denies any radiating symptoms. Using Tylenol and IBU.  ? ? ? ?  ? ?Past Medical History:  ?Diagnosis Date  ? Anxiety   ? Breast hypertrophy 03/2014  ? GERD (gastroesophageal reflux disease)   ? Hyperlipidemia   ? Hypothyroidism   ? Interstitial cystitis   ? ?Past Surgical History:  ?Procedure Laterality Date  ? BREAST BIOPSY Left   ? BREAST REDUCTION SURGERY Bilateral 04/18/2014  ? Procedure: MAMMARY REDUCTION  (BREAST) BILATERAL;  Surgeon: Charlene Brooke, MD;  Location: [facility];  Service: Plastics;  Laterality: Bilateral;  ? ?Social History  ? ?Socioeconomic History  ? Marital status: Married  ?  Spouse name: Not on file  ? Number of children: 1  ? Years of education: Not on file  ? Highest education level: Not on file  ?Occupational History  ? Occupation: Therapist, sports  ?Tobacco Use  ? Smoking status: Every Day  ?  Packs/day: 0.50  ?  Years: 30.00  ?  Pack years: 15.00  ?  Types: Cigarettes  ? Smokeless tobacco: Never  ? Tobacco comments:  ?  currently down to 0.25 pack/day or so  ?Vaping Use  ? Vaping Use: Never used  ?Substance and  Sexual Activity  ? Alcohol use: Yes  ?  Alcohol/week: 10.0 standard drinks  ?  Types: 10 Standard drinks or equivalent per week  ?  Comment: weekends  ? Drug use: No  ? Sexual activity: Yes  ?  Partners: Male  ?  Birth control/protection: Surgical  ?  Comment: partner had vasectomy  ?Other Topics Concern  ? Not on file  ?Social History Narrative  ? Not on file  ? ?Social Determinants of Health  ? ?Financial Resource Strain: Not on file  ?Food Insecurity: Not on file  ?Transportation Needs: Not on file  ?Physical Activity: Not on file  ?Stress: Not on file  ?Social Connections: Not on file  ? ?Allergies  ?Allergen Reactions  ? Amoxicillin Rash and Other (See Comments)  ?  FEVER  ? Bisoprolol Anxiety  ? Metoprolol Tartrate Palpitations  ? Verapamil Rash  ? ?Family History  ?Problem Relation Age of Onset  ? Diabetes Father   ? Osteosarcoma Sister 72  ? CVA Maternal Grandfather 59  ? Heart failure Paternal Grandfather   ?     90s  ? Breast cancer Neg Hx   ? Colon cancer Neg Hx   ? Esophageal cancer Neg Hx   ? Pancreatic cancer Neg Hx   ? Stomach cancer Neg Hx   ? ? ?Current Outpatient Medications (Endocrine & Metabolic):  ?  levothyroxine (SYNTHROID) 112 MCG tablet, TAKE 1 TAB BY MOUTH DAILY ON AN EMPTY STOMACH WITH ONLY WATER FOR 30 MINUTES & NO ANTACID MEDS, CALCIUM OR MAGNESIUM FOR 4 HOURS AVOID BIOTIN ? ?Current Outpatient Medications (Cardiovascular):  ?  ezetimibe (ZETIA) 10 MG tablet, Take 1 tablet (10 mg total) by mouth daily. ?  metoprolol succinate (TOPROL-XL) 50 MG 24 hr tablet, Take 1 tablet (50 mg total) by mouth daily. Take with or immediately following a meal. ?  nitroGLYCERIN (NITRO-DUR) 0.2 mg/hr patch, Apply 1/4 of a patch to skin once daily. ? ?Current Outpatient Medications (Respiratory):  ?  budesonide-formoterol (SYMBICORT) 80-4.5 MCG/ACT inhaler, Inhale 2 puffs daily twice a day, wash your mouth afterwards ?  fluticasone (FLONASE) 50 MCG/ACT nasal spray, Place 2 sprays into both nostrils daily.  (Patient taking differently: Place 2 sprays into both nostrils as needed.) ? ? ? ?Current Outpatient Medications (Other):  ?  ALPRAZolam (XANAX) 0.5 MG tablet, Take 0.5-1 tablets (0.25-0.5 mg total) by mouth 3 (three) times daily as needed for severe anxiety or panic. AVOID TAKING DAILY DUE TO RISK OF TOLERANCE AND ADDICTION . Must last 30 days ?  Cholecalciferol (VITAMIN D PO), Take 5,000 Units by mouth daily. ?  cyclobenzaprine (FLEXERIL) 10 MG tablet, Take 1 tablet (10 mg total) by mouth every 8 (eight) hours as needed for muscle spasms. ?  escitalopram (LEXAPRO) 20 MG tablet, Take 1 tablet (20 mg total) by mouth daily for mood ?  Insulin Pen Needle 32G X 4 MM MISC, Use 1 needle daily for injection ?  omeprazole (PRILOSEC) 20 MG capsule, Take 1 capsule (20 mg total) by mouth daily as needed. ?  oxybutynin (DITROPAN-XL) 10 MG 24 hr tablet, Take 1 tablet (10 mg total) by mouth daily for bladder control ?  Semaglutide-Weight Management (WEGOVY) 0.5 MG/0.5ML SOAJ, Inject 0.5 mg into the skin once a week. ? ? ?Reviewed prior external information including notes and imaging from  ?primary care provider ?As well as notes that were available from care everywhere and other healthcare systems. ? ?Past medical history, social, surgical and family history all reviewed in electronic medical record.  No pertanent information unless stated regarding to the chief complaint.  ? ?Review of Systems: ? No headache, visual changes, nausea, vomiting, diarrhea, constipation, dizziness, abdominal pain, skin rash, fevers, chills, night sweats, weight loss, swollen lymph nodes, body aches, joint swelling, chest pain, shortness of breath, mood changes. POSITIVE muscle aches ? ?Objective  ?Blood pressure 110/78, pulse 78, height '5\' 8"'$  (1.727 m), weight 188 lb (85.3 kg), SpO2 98 %. ?  ?General: No apparent distress alert and oriented x3 mood and affect normal, dressed appropriately.  ?HEENT: Pupils equal, extraocular movements intact   ?Respiratory: Patient's speak in full sentences and does not appear short of breath  ?Cardiovascular: No lower extremity edema, non tender, no erythema  ?Gait normal with good balance and coordination.  ?MSK: Right elbow exam does have tenderness to palpation in the lateral epicondylar region.  Patient does have movement worsening pain with resisted wrist extension.  Good grip strength noted. ? ?Limited muscular skeletal ultrasound was performed and interpreted by Shari Payne, M  ?Limited ultrasound of patient's right elbow shows the patient does have some increased neovascularization in the and hypoechoic changes which is consistent with a potential split tear of the common extensor tendon at the insertion of the lateral epicondylar region.  No significant retraction noted.  No avulsion noted. ?Impression: Common extensor tendon tear ? ?  ?Impression  and Recommendations:  ?  ? ?The above documentation has been reviewed and is accurate and complete Lyndal Pulley, DO ? ? ? ?

## 2021-09-03 ENCOUNTER — Telehealth: Payer: Self-pay

## 2021-09-03 ENCOUNTER — Other Ambulatory Visit (HOSPITAL_COMMUNITY): Payer: Self-pay

## 2021-09-03 ENCOUNTER — Ambulatory Visit: Payer: Self-pay

## 2021-09-03 ENCOUNTER — Ambulatory Visit (INDEPENDENT_AMBULATORY_CARE_PROVIDER_SITE_OTHER): Payer: Federal, State, Local not specified - PPO | Admitting: Family Medicine

## 2021-09-03 VITALS — BP 110/78 | HR 78 | Ht 68.0 in | Wt 188.0 lb

## 2021-09-03 DIAGNOSIS — S56511A Strain of other extensor muscle, fascia and tendon at forearm level, right arm, initial encounter: Secondary | ICD-10-CM

## 2021-09-03 DIAGNOSIS — M25521 Pain in right elbow: Secondary | ICD-10-CM

## 2021-09-03 MED ORDER — NITROGLYCERIN 0.2 MG/HR TD PT24
MEDICATED_PATCH | TRANSDERMAL | 0 refills | Status: DC
  Filled 2021-09-03: qty 22, 88d supply, fill #0

## 2021-09-03 NOTE — Assessment & Plan Note (Addendum)
Partial tearing noted.  Nitroglycerin started.  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  Discussed avoiding overhead lifting.  Wrist brace given.  Work with a fractured interval and home exercises.  Follow-up again in 6 to 8 weeks worsening pain will consider formal physical therapy or the possibility of PRP injections. ?

## 2021-09-03 NOTE — Patient Instructions (Addendum)
Wrist brace day and night for 2 weeks then just at night for 2 weeks ?Avoid overhand lifting ?Exercises ?Nitroglycerin Protocol ? ?? Apply 1/4 nitroglycerin patch to affected area daily. ?? Change position of patch within the affected area every 24 hours. ?? You may experience a headache during the first 1-2 weeks of using the patch, these should subside. ?? If you experience headaches after beginning nitroglycerin patch treatment, you may take your preferred over the counter pain reliever. ?? Another side effect of the nitroglycerin patch is skin irritation or rash related to patch adhesive. ?? Please notify our office if you develop more severe headaches or rash, and stop the patch. ?? Tendon healing with nitroglycerin patch may require 12 to 24 weeks depending on the extent of injury. ?? Men should not use if taking Viagra, Cialis, or Levitra.  ?? Do not use if you have migraines or rosacea. ?See me again in 6-8 weeks ? ? ?

## 2021-09-03 NOTE — Telephone Encounter (Signed)
Prior Auth for [employer] approved through 03/02/22. ?

## 2021-09-22 ENCOUNTER — Other Ambulatory Visit: Payer: Self-pay

## 2021-09-22 ENCOUNTER — Other Ambulatory Visit: Payer: Self-pay | Admitting: Adult Health Nurse Practitioner

## 2021-09-22 MED FILL — Levothyroxine Sodium Tab 112 MCG: ORAL | 90 days supply | Qty: 90 | Fill #0 | Status: AC

## 2021-09-23 ENCOUNTER — Other Ambulatory Visit (HOSPITAL_COMMUNITY): Payer: Self-pay

## 2021-09-24 ENCOUNTER — Other Ambulatory Visit (HOSPITAL_COMMUNITY): Payer: Self-pay

## 2021-09-30 ENCOUNTER — Other Ambulatory Visit (HOSPITAL_COMMUNITY): Payer: Self-pay

## 2021-10-02 ENCOUNTER — Other Ambulatory Visit (HOSPITAL_COMMUNITY): Payer: Self-pay

## 2021-10-04 ENCOUNTER — Other Ambulatory Visit (HOSPITAL_COMMUNITY): Payer: Self-pay

## 2021-10-07 ENCOUNTER — Ambulatory Visit: Payer: Self-pay | Admitting: Family Medicine

## 2021-10-26 ENCOUNTER — Other Ambulatory Visit: Payer: Self-pay | Admitting: Nurse Practitioner

## 2021-10-26 DIAGNOSIS — E669 Obesity, unspecified: Secondary | ICD-10-CM

## 2021-10-26 MED ORDER — WEGOVY 0.5 MG/0.5ML ~~LOC~~ SOAJ
0.5000 mg | SUBCUTANEOUS | 1 refills | Status: DC
  Filled 2021-10-26 – 2021-12-03 (×3): qty 2, 28d supply, fill #0
  Filled 2021-12-26: qty 2, 28d supply, fill #1

## 2021-10-28 ENCOUNTER — Other Ambulatory Visit (HOSPITAL_COMMUNITY): Payer: Self-pay

## 2021-10-30 ENCOUNTER — Other Ambulatory Visit (HOSPITAL_COMMUNITY): Payer: Self-pay

## 2021-11-14 ENCOUNTER — Other Ambulatory Visit (HOSPITAL_COMMUNITY): Payer: Self-pay

## 2021-11-14 ENCOUNTER — Other Ambulatory Visit: Payer: Self-pay | Admitting: Adult Health

## 2021-11-14 ENCOUNTER — Other Ambulatory Visit: Payer: Self-pay | Admitting: Cardiology

## 2021-11-14 MED ORDER — METOPROLOL SUCCINATE ER 50 MG PO TB24
50.0000 mg | ORAL_TABLET | Freq: Every day | ORAL | 0 refills | Status: DC
  Filled 2021-11-14: qty 90, 90d supply, fill #0

## 2021-11-14 MED ORDER — ALPRAZOLAM 0.5 MG PO TABS
0.2500 mg | ORAL_TABLET | Freq: Three times a day (TID) | ORAL | 0 refills | Status: DC | PRN
  Filled 2021-11-14: qty 60, 30d supply, fill #0

## 2021-11-18 ENCOUNTER — Ambulatory Visit: Admit: 2021-11-18 | Payer: Managed Care (Private) | Attending: Internal Medicine | Primary: Internal Medicine

## 2021-11-18 ENCOUNTER — Encounter: Admit: 2021-11-18 | Payer: PRIVATE HEALTH INSURANCE | Attending: Internal Medicine | Primary: Internal Medicine

## 2021-11-18 MED ORDER — PREDNISONE 20 MG TABLET
20 | ORAL_TABLET | ORAL | 1 refills | 12.00000 days | Status: AC
Start: 2021-11-18 — End: 2022-05-21

## 2021-12-03 ENCOUNTER — Other Ambulatory Visit (HOSPITAL_COMMUNITY): Payer: Self-pay

## 2021-12-18 ENCOUNTER — Other Ambulatory Visit (HOSPITAL_COMMUNITY): Payer: Self-pay

## 2021-12-18 MED FILL — Levothyroxine Sodium Tab 112 MCG: ORAL | 90 days supply | Qty: 90 | Fill #1 | Status: AC

## 2021-12-22 ENCOUNTER — Encounter: Admit: 2021-12-22 | Payer: PRIVATE HEALTH INSURANCE | Attending: Internal Medicine | Primary: Internal Medicine

## 2021-12-23 MED ORDER — METOPROLOL SUCCINATE ER 25 MG TABLET,EXTENDED RELEASE 24 HR
25 | ORAL_TABLET | ORAL | 2 refills | 90.00000 days | Status: AC
Start: 2021-12-23 — End: 2022-08-11

## 2021-12-26 ENCOUNTER — Other Ambulatory Visit: Payer: Self-pay | Admitting: Nurse Practitioner

## 2021-12-26 ENCOUNTER — Other Ambulatory Visit (HOSPITAL_COMMUNITY): Payer: Self-pay

## 2021-12-26 DIAGNOSIS — K2100 Gastro-esophageal reflux disease with esophagitis, without bleeding: Secondary | ICD-10-CM

## 2021-12-26 MED ORDER — OMEPRAZOLE 20 MG PO CPDR
20.0000 mg | DELAYED_RELEASE_CAPSULE | Freq: Every day | ORAL | 1 refills | Status: DC | PRN
  Filled 2021-12-26: qty 90, 90d supply, fill #0
  Filled 2022-02-13 – 2022-03-21 (×2): qty 90, 90d supply, fill #1

## 2022-01-27 ENCOUNTER — Other Ambulatory Visit (HOSPITAL_COMMUNITY): Payer: Self-pay

## 2022-01-30 ENCOUNTER — Other Ambulatory Visit (HOSPITAL_COMMUNITY): Payer: Self-pay

## 2022-01-30 ENCOUNTER — Other Ambulatory Visit: Payer: Self-pay | Admitting: Nurse Practitioner

## 2022-01-30 DIAGNOSIS — E669 Obesity, unspecified: Secondary | ICD-10-CM

## 2022-01-30 MED ORDER — WEGOVY 0.5 MG/0.5ML ~~LOC~~ SOAJ
0.5000 mg | SUBCUTANEOUS | 1 refills | Status: DC
  Filled 2022-01-30: qty 2, 28d supply, fill #0
  Filled 2022-03-06: qty 2, 28d supply, fill #1

## 2022-02-04 ENCOUNTER — Telehealth: Payer: Self-pay

## 2022-02-04 NOTE — Telephone Encounter (Signed)
LVM for patient to reach out to Shari Payne as they are requesting her to return for 6 month recheck. I asked the patient to call this office with any questions.

## 2022-02-11 ENCOUNTER — Other Ambulatory Visit (HOSPITAL_COMMUNITY): Payer: Self-pay

## 2022-02-13 ENCOUNTER — Other Ambulatory Visit: Payer: Self-pay | Admitting: Cardiology

## 2022-02-14 ENCOUNTER — Other Ambulatory Visit (HOSPITAL_COMMUNITY): Payer: Self-pay

## 2022-02-14 MED ORDER — METOPROLOL SUCCINATE ER 50 MG PO TB24
50.0000 mg | ORAL_TABLET | Freq: Every day | ORAL | 0 refills | Status: DC
  Filled 2022-02-14 – 2022-02-19 (×2): qty 60, 60d supply, fill #0

## 2022-02-18 ENCOUNTER — Other Ambulatory Visit (HOSPITAL_COMMUNITY): Payer: Self-pay

## 2022-02-19 ENCOUNTER — Other Ambulatory Visit (HOSPITAL_COMMUNITY): Payer: Self-pay

## 2022-02-24 ENCOUNTER — Telehealth: Payer: Self-pay

## 2022-02-24 NOTE — Telephone Encounter (Signed)
[employer] prior auth completed and submitted. 

## 2022-02-25 NOTE — Telephone Encounter (Signed)
Prior auth approved through 08/23/22

## 2022-02-28 ENCOUNTER — Encounter: Payer: Federal, State, Local not specified - PPO | Admitting: Nurse Practitioner

## 2022-03-21 ENCOUNTER — Other Ambulatory Visit (HOSPITAL_COMMUNITY): Payer: Self-pay

## 2022-03-25 ENCOUNTER — Encounter: Payer: Self-pay | Admitting: Internal Medicine

## 2022-03-25 DIAGNOSIS — R92323 Mammographic fibroglandular density, bilateral breasts: Secondary | ICD-10-CM | POA: Diagnosis not present

## 2022-03-25 LAB — HM MAMMOGRAPHY

## 2022-03-27 NOTE — Progress Notes (Addendum)
 Complete Physical  Assessment and Plan:  Encounter for general adult medical examination with abnormal findings Due yearly  Tachycardia Continue to cut down on caffeine, ETOH, smoking Continue toprol; symptoms have improved/resolved Followed by cardiology; titrating BB with them - denies symptoms of bradycardia which is noted on EKG  Gastroesophageal reflux disease with esophagitis Symptoms well managed without breakthrough Was not controlled when try to taper Omeprazole -Magnesium  Hypothyroidism Hypothyroidism-check TSH level, continue medications the same, reminded to take on an empty stomach 30-88mns before food.  -     TSH  BMI 24 Has lost 62 pounds with Wegovy Continue diet and exercise and Wegovy 0.5 mg SQ QW  OAB Avoid triggers, continue oxybutynin  Anemia, unspecified type - monitor, continue iron supp with Vitamin C and increase green leafy veggies -     CBC with Differential/Platelet  Vitamin D deficiency Continue Vit D supplementation to maintain value in therapeutic level of 60-100  -     VITAMIN D 25 Hydroxy (Vit-D Deficiency, Fractures)  Generalized anxiety disorder/insomnia continue medications, stress management techniques discussed, increase water, good sleep hygiene discussed, increase exercise, and increase veggies.   Medication management -     CMP/GFR -     Magnesium  Hyperlipidemia/Statin intolerance Continue Zetia, diet and exercise -     Lipid panel  Tobacco use disorder Smoking 1/2 ppd Counseled on dangers of smoking and strongly encouraged to quit. Currently is not ready to quit  Screening for blood or protein in urine -     Urinalysis, Routine w reflex microscopic -     Microalbumin / creatinine urine ratio  Abnormal Glucose Continue diet and exercise      -      A1C  Screening for ischemic heart disease - EKG  Discussed med's effects and SE's. Screening labs and tests as requested with regular follow-up as recommended. Over  40 minutes of exam, counseling, chart review, and critical decision making was performed this visit.   Future Appointments  Date Time Provider [place] 04/01/2023  3:00 PM WAlycia Rossetti NP [id number] None     HPI  51y.o. female presents for a complete physical. She has Thyroid disease; Vitamin D deficiency; OAB (overactive bladder); Generalized anxiety disorder; GERD (gastroesophageal reflux disease); Insomnia; Hyperlipidemia; Obesity (BMI 30.0-34.9); Tachycardia; Smoker; DDD (degenerative disc disease), lumbar; Lumbar back pain with radiculopathy affecting right lower extremity; Somatic dysfunction of spine, lumbar; and Partial tear of common extensor tendon of right elbow on their problem list.   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 She has 294year old son.   She is following with her OB/GYN, Dr. BMelba Coon for OAB on oxybutynin; pt reports she saw [facility] several years ago who r/o previously suspected interstitial cystitis and she reports has done well since with this medication.   She has had ongoing tachycardia/palpitations and was having dyspnea associated with this and was worked up by cardiology; Holter showed some PVCs/PACs, ECHO from 10/2017 was unremarkable. She is on toprol 50 mg with improvement in symptoms.   She is on lexapro 20 mg QD and has been doing well. Xanax PRN for anxiety; she currently uses 0.25 mg at night if needed to sleep.   She is smoking 1/2 ppd - not currently ready to quit.   GERD symptoms are currently well controlled with Omeprazole 20 mg QD and dietary modifications  BMI is Body mass index is 24.21 kg/m.,  she has been working on diet and exercise. She aims to get 150 min of walking/running weekly. She is currently on Wegovy 0.5 mg SQ QW. Heaviest was 221- has lost 62 pounds Wt Readings from Last 3 Encounters:  03/31/22 159 lb 3.2 oz (72.2 kg)  09/03/21 188 lb (85.3 kg)   06/18/21 189 lb 9.6 oz (86 kg)   Her blood pressure has been controlled at home, today their BP is BP: 90/62  BP Readings from Last 3 Encounters:  03/31/22 90/62  09/03/21 110/78  06/18/21 102/78  She does workout. She denies chest pain, shortness of breath, dizziness.   She is on cholesterol medication, she was on crestor but was having myalgias so stopped, currently on Zetia 10 mg QD. Her cholesterol is not at goal. The cholesterol last visit was:   Lab Results  Component Value Date   CHOL 211 (H) 06/18/2021   HDL 78 06/18/2021   LDLCALC 119 (H) 06/18/2021   TRIG 58 06/18/2021   CHOLHDL 2.7 06/18/2021   Last A1C in the office was:   Lab Results  Component Value Date   HGBA1C 5.2 02/28/2021   Patient is on Vitamin D supplement, 5000 IU daily  Lab Results  Component Value Date   VD25OH 54 02/28/2021   She is on thyroid medication. Her medication was not changed last visit. taking 112 mcg daily on empty stomach. Not on a biotin.  Lab Results  Component Value Date   TSH 0.50 06/18/2021   She is taking oxybutnin 10 mg  24 hour tablet daily for overactive bladder symptoms. Does not get dry mouth too bad.    Current Medications:   Current Outpatient Medications (Endocrine & Metabolic):    levothyroxine (SYNTHROID) 112 MCG tablet, Take 1 tablet by mouth once daily on an empty stomach with only water for 30 minutes & no Antacid meds, Calcium or Magnesium for 4 hours & avoid Biotin.  Current Outpatient Medications (Cardiovascular):    ezetimibe (ZETIA) 10 MG tablet, Take 1 tablet (10 mg total) by mouth daily.   metoprolol succinate (TOPROL-XL) 50 MG 24 hr tablet, Take 1 tablet (50 mg total) by mouth daily. Take with or immediately following a meal.   nitroGLYCERIN (NITRO-DUR) 0.2 mg/hr patch, Apply 1/4 of a patch to skin once daily. (Patient not taking: Reported on 03/31/2022)  Current Outpatient Medications (Respiratory):    budesonide-formoterol (SYMBICORT) 80-4.5 MCG/ACT  inhaler, Inhale 2 puffs daily twice a day, wash your mouth afterwards   fluticasone (FLONASE) 50 MCG/ACT nasal spray, Place 2 sprays into both nostrils daily. (Patient taking differently: Place 2 sprays into both nostrils as needed.)    Current Outpatient Medications (Other):    ALPRAZolam (XANAX) 0.5 MG tablet, Take 0.5-1 tablets (0.25-0.5 mg total) by mouth 3 (three) times daily as needed for severe anxiety or panic. AVOID TAKING DAILY DUE TO RISK OF TOLERANCE AND ADDICTION . Must last 30 days   Cholecalciferol (VITAMIN D PO), Take 5,000 Units by mouth daily.   cyclobenzaprine (FLEXERIL) 10 MG tablet, Take 1 tablet (10 mg total) by mouth every 8 (eight) hours as needed for muscle spasms.   escitalopram (LEXAPRO) 20 MG tablet, Take 1 tablet (20 mg total) by mouth daily for mood   Insulin Pen Needle 32G X 4 MM MISC, Use 1 needle daily for injection   omeprazole (PRILOSEC) 20 MG capsule, Take 1 capsule (20 mg total) by mouth daily as needed.   oxybutynin (DITROPAN-XL) 10 MG 24 hr tablet, Take 1  tablet (10 mg total) by mouth daily for bladder control   Semaglutide-Weight Management (WEGOVY) 0.5 MG/0.5ML SOAJ, Inject 0.5 mg into the skin once a week.  Health Maintenance:    Immunization History  Administered Date(s) Administered   Influenza, Seasonal, Injecte, Preservative Fre 02/26/2016   Influenza-Unspecified 03/30/2018, 02/17/2022   PFIZER(Purple Top)SARS-COV-2 Vaccination 05/09/2019, 05/27/2019   Tdap 08/15/2014   Tetanus: 2016 Pneumovax: N/A Prevnar 13: N/A Flu vaccine: 2023- gets at work Zostavax: N/A COVID 54 will get booster tomorrow  LMP: No LMP recorded. (Menstrual status: Perimenopausal). Pap: Dr. Melba Coon, 02/16/2020  MGM: 02/16/2020  DEXA: N/A Colonoscopy: N/A will refer next year due to new guidelines FIT negative 10/2019 EGD: N/A CXR 01/2019  Last Dental Exam: Dr.  Derek Jack, q59mLast Eye Exam: [facility] Ser Dr. MAlease Medina 2020, wears glasses  Patient Care  Team: MUnk Pinto MD as PCP - General (Internal Medicine) CLelon Perla MD as PCP - Cardiology (Cardiology)  Medical History:  Past Medical History:  Diagnosis Date   Anxiety    Breast hypertrophy 03/2014   GERD (gastroesophageal reflux disease)    Hyperlipidemia    Hypothyroidism    Interstitial cystitis    Allergies Allergies  Allergen Reactions   Amoxicillin Rash and Other (See Comments)    FEVER   Bisoprolol Anxiety   Metoprolol Tartrate Palpitations   Verapamil Rash    SURGICAL HISTORY She  has a past surgical history that includes Breast reduction surgery (Bilateral, 04/18/2014) and Breast biopsy (Left). FAMILY HISTORY Her family history includes CVA (age of onset: 82 in her maternal grandfather; Diabetes in her father; Heart failure in her paternal grandfather; Osteosarcoma (age of onset: 261 in her sister. SOCIAL HISTORY She  reports that she has been smoking cigarettes. She has a 15.00 pack-year smoking history. She has never used smokeless tobacco. She reports current alcohol use of about 10.0 standard drinks of alcohol per week. She reports that she does not use drugs.  Review of Systems: Review of Systems  Constitutional: Negative.  Negative for malaise/fatigue and weight loss.  HENT: Negative.  Negative for hearing loss and tinnitus.   Eyes: Negative.  Negative for blurred vision and double vision.  Respiratory: Negative.  Negative for cough, sputum production, shortness of breath and wheezing.   Cardiovascular: Negative.  Negative for chest pain, palpitations, orthopnea, claudication, leg swelling and PND.  Gastrointestinal:  Negative for abdominal pain, blood in stool, constipation, diarrhea, heartburn, melena, nausea and vomiting.  Genitourinary:  Negative for dysuria, flank pain, frequency, hematuria and urgency.  Musculoskeletal: Negative.  Negative for falls, joint pain and myalgias.  Skin: Negative.  Negative for rash.  Neurological: Negative.   Negative for dizziness, tingling, sensory change, weakness and headaches.  Endo/Heme/Allergies:  Negative for polydipsia.  Psychiatric/Behavioral:  Negative for depression, memory loss, substance abuse and suicidal ideas. The patient is not nervous/anxious and does not have insomnia.   All other systems reviewed and are negative.   Physical Exam: Estimated body mass index is 24.21 kg/m as calculated from the following:   Height as of this encounter: '5\' 8"'$  (1.727 m).   Weight as of this encounter: 159 lb 3.2 oz (72.2 kg). BP 90/62   Pulse 72   Temp 97.9 F (36.6 C)   Ht '5\' 8"'$  (1.727 m)   Wt 159 lb 3.2 oz (72.2 kg)   SpO2 97%   BMI 24.21 kg/m   Wt Readings from Last 3 Encounters:  03/31/22 159 lb 3.2 oz (72.2 kg)  09/03/21 188  lb (85.3 kg)  06/18/21 189 lb 9.6 oz (86 kg)   General Appearance: Well nourished, in no apparent distress.  Eyes: PERRLA, EOMs, conjunctiva no swelling or erythema, normal fundi and vessels.  Sinuses: No Frontal/maxillary tenderness  ENT/Mouth: Ext aud canals clear, normal light reflex with TMs without erythema, bulging. Good dentition. No erythema, swelling, or exudate on post pharynx. She has large left tonsil, R side not visible, no erythema, irregular texture or appearance of lesion (chronic and unchanged per patient), Hearing normal.  Neck: Supple, thyroid normal. No bruits  Respiratory: Respiratory effort normal, BS equal bilaterally without rales, rhonchi, wheezing or stridor.  Cardio: RRR without murmurs, rubs or gallops. Brisk peripheral pulses without edema.  Chest: symmetric, with normal excursions and percussion.  Breasts: defer to GYN Abdomen: Soft, nontender, no guarding, rebound, hernias, masses, or organomegaly.  Lymphatics: Non tender without lymphadenopathy.  Genitourinary: defer to GYN Musculoskeletal: Full ROM all peripheral extremities,5/5 strength, and normal gait.  Skin: Warm, dry without rashes, lesions, ecchymosis. Neuro: Cranial  nerves intact, reflexes equal bilaterally. Normal muscle tone, no cerebellar symptoms. Sensation intact.  Psych: Awake and oriented X 3, normal affect, Insight and Judgment appropriate.   EKG: NSR, no ST changes  Nioka Thorington E  3:13 PM [facility]

## 2022-03-31 ENCOUNTER — Other Ambulatory Visit (HOSPITAL_COMMUNITY): Payer: Self-pay

## 2022-03-31 ENCOUNTER — Encounter: Payer: Self-pay | Admitting: Nurse Practitioner

## 2022-03-31 ENCOUNTER — Ambulatory Visit (INDEPENDENT_AMBULATORY_CARE_PROVIDER_SITE_OTHER): Payer: Federal, State, Local not specified - PPO | Admitting: Nurse Practitioner

## 2022-03-31 VITALS — BP 90/62 | HR 72 | Temp 97.9°F | Ht 68.0 in | Wt 159.2 lb

## 2022-03-31 DIAGNOSIS — Z0001 Encounter for general adult medical examination with abnormal findings: Secondary | ICD-10-CM

## 2022-03-31 DIAGNOSIS — R000 Tachycardia, unspecified: Secondary | ICD-10-CM

## 2022-03-31 DIAGNOSIS — Z0000 Encounter for general adult medical examination without abnormal findings: Secondary | ICD-10-CM | POA: Diagnosis not present

## 2022-03-31 DIAGNOSIS — E669 Obesity, unspecified: Secondary | ICD-10-CM

## 2022-03-31 DIAGNOSIS — D649 Anemia, unspecified: Secondary | ICD-10-CM

## 2022-03-31 DIAGNOSIS — Z131 Encounter for screening for diabetes mellitus: Secondary | ICD-10-CM

## 2022-03-31 DIAGNOSIS — Z6824 Body mass index (BMI) 24.0-24.9, adult: Secondary | ICD-10-CM

## 2022-03-31 DIAGNOSIS — Z1389 Encounter for screening for other disorder: Secondary | ICD-10-CM | POA: Diagnosis not present

## 2022-03-31 DIAGNOSIS — Z79899 Other long term (current) drug therapy: Secondary | ICD-10-CM | POA: Diagnosis not present

## 2022-03-31 DIAGNOSIS — E559 Vitamin D deficiency, unspecified: Secondary | ICD-10-CM

## 2022-03-31 DIAGNOSIS — E079 Disorder of thyroid, unspecified: Secondary | ICD-10-CM

## 2022-03-31 DIAGNOSIS — R7309 Other abnormal glucose: Secondary | ICD-10-CM

## 2022-03-31 DIAGNOSIS — N3281 Overactive bladder: Secondary | ICD-10-CM

## 2022-03-31 DIAGNOSIS — E782 Mixed hyperlipidemia: Secondary | ICD-10-CM

## 2022-03-31 DIAGNOSIS — Z13220 Encounter for screening for lipoid disorders: Secondary | ICD-10-CM | POA: Diagnosis not present

## 2022-03-31 DIAGNOSIS — Z789 Other specified health status: Secondary | ICD-10-CM

## 2022-03-31 DIAGNOSIS — K2100 Gastro-esophageal reflux disease with esophagitis, without bleeding: Secondary | ICD-10-CM

## 2022-03-31 DIAGNOSIS — Z136 Encounter for screening for cardiovascular disorders: Secondary | ICD-10-CM

## 2022-03-31 DIAGNOSIS — F411 Generalized anxiety disorder: Secondary | ICD-10-CM

## 2022-03-31 DIAGNOSIS — F17200 Nicotine dependence, unspecified, uncomplicated: Secondary | ICD-10-CM

## 2022-03-31 MED ORDER — CYCLOBENZAPRINE HCL 10 MG PO TABS
10.0000 mg | ORAL_TABLET | Freq: Three times a day (TID) | ORAL | 1 refills | Status: AC | PRN
  Filled 2022-03-31 – 2022-08-25 (×2): qty 60, 20d supply, fill #0

## 2022-03-31 NOTE — Patient Instructions (Signed)

## 2022-04-01 ENCOUNTER — Encounter: Payer: Self-pay | Admitting: Nurse Practitioner

## 2022-04-01 LAB — URINALYSIS W MICROSCOPIC + REFLEX CULTURE
Bacteria, UA: NONE SEEN /HPF
Bilirubin Urine: NEGATIVE
Glucose, UA: NEGATIVE
Hgb urine dipstick: NEGATIVE
Hyaline Cast: NONE SEEN /LPF
Ketones, ur: NEGATIVE
Leukocyte Esterase: NEGATIVE
Nitrites, Initial: NEGATIVE
Protein, ur: NEGATIVE
RBC / HPF: NONE SEEN /HPF (ref 0–2)
Specific Gravity, Urine: 1.009 (ref 1.001–1.035)
WBC, UA: NONE SEEN /HPF (ref 0–5)
pH: 6.5 (ref 5.0–8.0)

## 2022-04-01 LAB — COMPLETE METABOLIC PANEL WITH GFR
AG Ratio: 1.5 (calc) (ref 1.0–2.5)
ALT: 18 U/L (ref 6–29)
AST: 21 U/L (ref 10–35)
Albumin: 4.1 g/dL (ref 3.6–5.1)
Alkaline phosphatase (APISO): 52 U/L (ref 31–125)
BUN: 12 mg/dL (ref 7–25)
CO2: 29 mmol/L (ref 20–32)
Calcium: 9.1 mg/dL (ref 8.6–10.2)
Chloride: 104 mmol/L (ref 98–110)
Creat: 0.96 mg/dL (ref 0.50–0.99)
Globulin: 2.8 g/dL (calc) (ref 1.9–3.7)
Glucose, Bld: 73 mg/dL (ref 65–99)
Potassium: 3.8 mmol/L (ref 3.5–5.3)
Sodium: 140 mmol/L (ref 135–146)
Total Bilirubin: 0.5 mg/dL (ref 0.2–1.2)
Total Protein: 6.9 g/dL (ref 6.1–8.1)
eGFR: 73 mL/min/{1.73_m2} (ref >=60)

## 2022-04-01 LAB — TSH: TSH: 4.71 mIU/L — ABNORMAL HIGH

## 2022-04-01 LAB — LIPID PANEL
Cholesterol: 159 mg/dL (ref <200)
HDL: 66 mg/dL (ref >=50)
LDL Cholesterol (Calc): 75 mg/dL (calc)
Non-HDL Cholesterol (Calc): 93 mg/dL (calc) (ref <130)
Total CHOL/HDL Ratio: 2.4 (calc) (ref <5.0)
Triglycerides: 94 mg/dL (ref <150)

## 2022-04-01 LAB — CBC WITH DIFFERENTIAL/PLATELET
Absolute Monocytes: 672 cells/uL (ref 200–950)
Basophils Absolute: 63 cells/uL (ref 0–200)
Basophils Relative: 0.9 %
Eosinophils Absolute: 140 cells/uL (ref 15–500)
Eosinophils Relative: 2 %
HCT: 37.9 % (ref 35.0–45.0)
Hemoglobin: 12.8 g/dL (ref 11.7–15.5)
Lymphs Abs: 1981 cells/uL (ref 850–3900)
MCH: 31.1 pg (ref 27.0–33.0)
MCHC: 33.8 g/dL (ref 32.0–36.0)
MCV: 92.2 fL (ref 80.0–100.0)
MPV: 11.9 fL (ref 7.5–12.5)
Monocytes Relative: 9.6 %
Neutro Abs: 4144 cells/uL (ref 1500–7800)
Neutrophils Relative %: 59.2 %
Platelets: 254 10*3/uL (ref 140–400)
RBC: 4.11 10*6/uL (ref 3.80–5.10)
RDW: 11.6 % (ref 11.0–15.0)
Total Lymphocyte: 28.3 %
WBC: 7 10*3/uL (ref 3.8–10.8)

## 2022-04-01 LAB — HEMOGLOBIN A1C
Hgb A1c MFr Bld: 5.3 % of total Hgb (ref <5.7)
Mean Plasma Glucose: 105 mg/dL
eAG (mmol/L): 5.8 mmol/L

## 2022-04-01 LAB — MICROALBUMIN / CREATININE URINE RATIO
Creatinine, Urine: 73 mg/dL (ref 20–275)
Microalb Creat Ratio: 3 mcg/mg creat (ref <30)
Microalb, Ur: 0.2 mg/dL

## 2022-04-01 LAB — NO CULTURE INDICATED

## 2022-04-01 LAB — MAGNESIUM: Magnesium: 2 mg/dL (ref 1.5–2.5)

## 2022-04-01 LAB — VITAMIN D 25 HYDROXY (VIT D DEFICIENCY, FRACTURES): Vit D, 25-Hydroxy: 82 ng/mL (ref 30–100)

## 2022-04-08 ENCOUNTER — Other Ambulatory Visit (HOSPITAL_COMMUNITY): Payer: Self-pay

## 2022-04-14 ENCOUNTER — Other Ambulatory Visit (HOSPITAL_COMMUNITY): Payer: Self-pay

## 2022-04-15 ENCOUNTER — Other Ambulatory Visit: Payer: Self-pay | Admitting: Cardiology

## 2022-04-16 ENCOUNTER — Other Ambulatory Visit (HOSPITAL_COMMUNITY): Payer: Self-pay

## 2022-04-16 MED ORDER — METOPROLOL SUCCINATE ER 50 MG PO TB24
50.0000 mg | ORAL_TABLET | Freq: Every day | ORAL | 3 refills | Status: DC
  Filled 2022-04-16: qty 60, 60d supply, fill #0
  Filled 2022-06-19 – 2022-06-24 (×2): qty 60, 60d supply, fill #1
  Filled 2022-08-18 – 2022-08-25 (×2): qty 60, 60d supply, fill #2

## 2022-04-20 ENCOUNTER — Other Ambulatory Visit: Payer: Self-pay | Admitting: Nurse Practitioner

## 2022-04-20 MED FILL — Levothyroxine Sodium Tab 112 MCG: ORAL | 90 days supply | Qty: 90 | Fill #2 | Status: AC

## 2022-04-21 ENCOUNTER — Other Ambulatory Visit (HOSPITAL_COMMUNITY): Payer: Self-pay

## 2022-04-21 MED ORDER — OXYBUTYNIN CHLORIDE ER 10 MG PO TB24
10.0000 mg | ORAL_TABLET | Freq: Every day | ORAL | 3 refills | Status: DC
  Filled 2022-04-21: qty 90, 90d supply, fill #0
  Filled 2022-07-27: qty 90, 90d supply, fill #1
  Filled 2022-10-21: qty 90, 90d supply, fill #2
  Filled 2023-01-16: qty 90, 90d supply, fill #3

## 2022-04-21 MED ORDER — ALPRAZOLAM 0.5 MG PO TABS
0.2500 mg | ORAL_TABLET | Freq: Three times a day (TID) | ORAL | 0 refills | Status: DC | PRN
  Filled 2022-04-21: qty 60, 30d supply, fill #0

## 2022-04-29 ENCOUNTER — Other Ambulatory Visit (HOSPITAL_COMMUNITY): Payer: Self-pay

## 2022-05-21 ENCOUNTER — Encounter: Admit: 2022-05-21 | Payer: PRIVATE HEALTH INSURANCE | Attending: Internal Medicine | Primary: Internal Medicine

## 2022-06-06 ENCOUNTER — Encounter: Admit: 2022-06-06 | Payer: PRIVATE HEALTH INSURANCE | Attending: Internal Medicine | Primary: Internal Medicine

## 2022-06-06 MED ORDER — ROSUVASTATIN 5 MG TABLET
5 | ORAL_TABLET | Freq: Every day | ORAL | 4 refills | 90.00000 days | Status: AC
Start: 2022-06-06 — End: 2023-06-07

## 2022-06-19 ENCOUNTER — Other Ambulatory Visit: Payer: Self-pay | Admitting: Nurse Practitioner

## 2022-06-19 ENCOUNTER — Other Ambulatory Visit: Payer: Self-pay

## 2022-06-19 DIAGNOSIS — K2100 Gastro-esophageal reflux disease with esophagitis, without bleeding: Secondary | ICD-10-CM

## 2022-06-19 DIAGNOSIS — E782 Mixed hyperlipidemia: Secondary | ICD-10-CM

## 2022-06-19 DIAGNOSIS — E669 Obesity, unspecified: Secondary | ICD-10-CM

## 2022-06-19 MED ORDER — EZETIMIBE 10 MG PO TABS
10.0000 mg | ORAL_TABLET | Freq: Every day | ORAL | 3 refills | Status: DC
  Filled 2022-06-19: qty 90, 90d supply, fill #0

## 2022-06-19 MED ORDER — OMEPRAZOLE 20 MG PO CPDR
20.0000 mg | DELAYED_RELEASE_CAPSULE | Freq: Every day | ORAL | 1 refills | Status: DC | PRN
  Filled 2022-06-19 – 2022-06-24 (×2): qty 90, 90d supply, fill #0
  Filled 2022-09-21: qty 90, 90d supply, fill #1

## 2022-06-19 MED ORDER — WEGOVY 0.5 MG/0.5ML ~~LOC~~ SOAJ
0.5000 mg | SUBCUTANEOUS | 1 refills | Status: DC
  Filled 2022-06-19: qty 2, 28d supply, fill #0
  Filled 2022-07-27: qty 2, 28d supply, fill #1

## 2022-06-20 ENCOUNTER — Other Ambulatory Visit: Payer: Self-pay

## 2022-06-20 ENCOUNTER — Other Ambulatory Visit (HOSPITAL_COMMUNITY): Payer: Self-pay

## 2022-06-24 ENCOUNTER — Other Ambulatory Visit (HOSPITAL_COMMUNITY): Payer: Self-pay

## 2022-06-25 ENCOUNTER — Other Ambulatory Visit: Payer: Self-pay

## 2022-06-30 ENCOUNTER — Other Ambulatory Visit: Payer: Self-pay

## 2022-07-01 ENCOUNTER — Other Ambulatory Visit (HOSPITAL_COMMUNITY): Payer: Self-pay

## 2022-07-02 ENCOUNTER — Other Ambulatory Visit (HOSPITAL_COMMUNITY): Payer: Self-pay

## 2022-07-11 ENCOUNTER — Encounter: Payer: Self-pay | Admitting: Nurse Practitioner

## 2022-07-11 ENCOUNTER — Other Ambulatory Visit (HOSPITAL_COMMUNITY): Payer: Self-pay

## 2022-07-11 ENCOUNTER — Other Ambulatory Visit: Payer: Self-pay | Admitting: Nurse Practitioner

## 2022-07-11 DIAGNOSIS — F411 Generalized anxiety disorder: Secondary | ICD-10-CM

## 2022-07-11 MED ORDER — ESCITALOPRAM OXALATE 20 MG PO TABS
20.0000 mg | ORAL_TABLET | Freq: Every day | ORAL | 3 refills | Status: DC
  Filled 2022-07-11: qty 90, 90d supply, fill #0
  Filled 2022-10-01: qty 90, 90d supply, fill #1
  Filled 2023-01-05: qty 90, 90d supply, fill #2
  Filled 2023-04-26: qty 90, 90d supply, fill #3

## 2022-07-15 MED FILL — Levothyroxine Sodium Tab 112 MCG: ORAL | 90 days supply | Qty: 90 | Fill #3 | Status: AC

## 2022-07-28 ENCOUNTER — Other Ambulatory Visit: Payer: Self-pay

## 2022-07-28 ENCOUNTER — Other Ambulatory Visit (HOSPITAL_COMMUNITY): Payer: Self-pay

## 2022-08-09 ENCOUNTER — Encounter: Admit: 2022-08-09 | Payer: PRIVATE HEALTH INSURANCE | Attending: Internal Medicine | Primary: Internal Medicine

## 2022-08-11 MED ORDER — METOPROLOL SUCCINATE ER 25 MG TABLET,EXTENDED RELEASE 24 HR
25 | ORAL_TABLET | ORAL | 2 refills | 90.00000 days | Status: AC
Start: 2022-08-11 — End: 2023-03-11

## 2022-08-13 DIAGNOSIS — Z124 Encounter for screening for malignant neoplasm of cervix: Secondary | ICD-10-CM | POA: Diagnosis not present

## 2022-08-13 DIAGNOSIS — Z1389 Encounter for screening for other disorder: Secondary | ICD-10-CM | POA: Diagnosis not present

## 2022-08-13 DIAGNOSIS — Z1151 Encounter for screening for human papillomavirus (HPV): Secondary | ICD-10-CM | POA: Diagnosis not present

## 2022-08-13 DIAGNOSIS — Z01419 Encounter for gynecological examination (general) (routine) without abnormal findings: Secondary | ICD-10-CM | POA: Diagnosis not present

## 2022-08-13 LAB — RESULTS CONSOLE HPV: CHL HPV: NEGATIVE

## 2022-08-13 LAB — HM PAP SMEAR: HM Pap smear: NEGATIVE

## 2022-08-18 ENCOUNTER — Encounter: Payer: Self-pay | Admitting: Cardiology

## 2022-08-19 ENCOUNTER — Other Ambulatory Visit: Payer: Self-pay

## 2022-08-22 ENCOUNTER — Other Ambulatory Visit: Payer: Self-pay

## 2022-08-24 ENCOUNTER — Other Ambulatory Visit: Payer: Self-pay | Admitting: Nurse Practitioner

## 2022-08-25 ENCOUNTER — Encounter: Payer: Self-pay | Admitting: Nurse Practitioner

## 2022-08-25 ENCOUNTER — Other Ambulatory Visit (HOSPITAL_COMMUNITY): Payer: Self-pay

## 2022-08-26 ENCOUNTER — Other Ambulatory Visit: Payer: Self-pay | Admitting: Nurse Practitioner

## 2022-08-26 ENCOUNTER — Other Ambulatory Visit: Payer: Self-pay

## 2022-08-26 ENCOUNTER — Other Ambulatory Visit (HOSPITAL_COMMUNITY): Payer: Self-pay

## 2022-08-26 DIAGNOSIS — F411 Generalized anxiety disorder: Secondary | ICD-10-CM

## 2022-08-26 MED ORDER — ALPRAZOLAM 0.5 MG PO TABS
0.2500 mg | ORAL_TABLET | Freq: Three times a day (TID) | ORAL | 0 refills | Status: DC | PRN
Start: 2022-08-26 — End: 2023-02-05
  Filled 2022-08-26: qty 55, 19d supply, fill #0

## 2022-09-13 ENCOUNTER — Encounter: Admit: 2022-09-13 | Payer: PRIVATE HEALTH INSURANCE | Attending: Internal Medicine | Primary: Internal Medicine

## 2022-09-15 MED ORDER — SERTRALINE 50 MG TABLET
50 | ORAL_TABLET | ORAL | 4 refills | 90.00000 days | Status: AC
Start: 2022-09-15 — End: 2023-09-07

## 2022-09-19 ENCOUNTER — Encounter: Admit: 2022-09-19 | Payer: PRIVATE HEALTH INSURANCE | Attending: Internal Medicine | Primary: Internal Medicine

## 2022-09-21 ENCOUNTER — Other Ambulatory Visit: Payer: Self-pay | Admitting: Nurse Practitioner

## 2022-09-21 DIAGNOSIS — E669 Obesity, unspecified: Secondary | ICD-10-CM

## 2022-09-22 ENCOUNTER — Other Ambulatory Visit (HOSPITAL_COMMUNITY): Payer: Self-pay

## 2022-09-22 MED ORDER — WEGOVY 0.5 MG/0.5ML ~~LOC~~ SOAJ
0.5000 mg | SUBCUTANEOUS | 1 refills | Status: DC
Start: 2022-09-22 — End: 2022-10-07
  Filled 2022-09-22: qty 2, 28d supply, fill #0

## 2022-09-24 NOTE — Progress Notes (Signed)
HPI: FU dyspnea and palpitations.  Holter monitor January 2019 showed sinus rhythm with occasional PACs, brief PAT and rare PVC. Echo June 2019 showed normal LV function.  Calcium score December 2022 0.  Since last seen the patient denies any dyspnea on exertion, orthopnea, PND, pedal edema, palpitations, syncope or chest pain.   Current Outpatient Medications  Medication Sig Dispense Refill   ALPRAZolam (XANAX) 0.5 MG tablet Take 0.5-1 tablets (0.25-0.5 mg total) by mouth 3 (three) times daily as needed for severe anxiety or panic. AVOID TAKING DAILY DUE TO RISK OF TOLERANCE AND ADDICTION . Must last 30 days 55 tablet 0   budesonide-formoterol (SYMBICORT) 80-4.5 MCG/ACT inhaler Inhale 2 puffs daily twice a day, wash your mouth afterwards 1 Inhaler 1   Cholecalciferol (VITAMIN D PO) Take 5,000 Units by mouth daily.     cyclobenzaprine (FLEXERIL) 10 MG tablet Take 1 tablet (10 mg total) by mouth every 8 (eight) hours as needed for muscle spasms. 60 tablet 1   escitalopram (LEXAPRO) 20 MG tablet Take 1 tablet (20 mg total) by mouth daily for mood 90 tablet 3   fluticasone (FLONASE) 50 MCG/ACT nasal spray Place 2 sprays into both nostrils daily. 16 g 0   levothyroxine (SYNTHROID) 112 MCG tablet Take 1 tablet by mouth once daily on an empty stomach with only water for 30 minutes & no Antacid meds, Calcium or Magnesium for 4 hours & avoid Biotin. 90 tablet 3   metoprolol succinate (TOPROL-XL) 50 MG 24 hr tablet Take 1 tablet (50 mg total) by mouth daily. Take with or immediately following a meal. 60 tablet 3   omeprazole (PRILOSEC) 20 MG capsule Take 1 capsule (20 mg total) by mouth daily as needed. 90 capsule 1   oxybutynin (DITROPAN-XL) 10 MG 24 hr tablet Take 1 tablet (10 mg total) by mouth daily for bladder control 90 tablet 3   Semaglutide-Weight Management (WEGOVY) 0.5 MG/0.5ML SOAJ Inject 0.5 mg into the skin once a week. 2 mL 1   No current facility-administered medications for this  visit.     Past Medical History:  Diagnosis Date   Anxiety    Breast hypertrophy 03/2014   GERD (gastroesophageal reflux disease)    Hyperlipidemia    Hypothyroidism    Interstitial cystitis     Past Surgical History:  Procedure Laterality Date   BREAST BIOPSY Left    BREAST REDUCTION SURGERY Bilateral 04/18/2014   Procedure: MAMMARY REDUCTION  (BREAST) BILATERAL;  Surgeon: Karie Fetch, MD;  Location: [facility];  Service: Plastics;  Laterality: Bilateral;    Social History   Socioeconomic History   Marital status: Married    Spouse name: Not on file   Number of children: 1   Years of education: Not on file   Highest education level: Not on file  Occupational History   Occupation: RN  Tobacco Use   Smoking status: Every Day    Packs/day: 0.50    Years: 30.00    Additional pack years: 0.00    Total pack years: 15.00    Types: Cigarettes   Smokeless tobacco: Never   Tobacco comments:    currently down to 0.25 pack/day or so  Vaping Use   Vaping Use: Never used  Substance and Sexual Activity   Alcohol use: Yes    Alcohol/week: 10.0 standard drinks of alcohol    Types: 10 Standard drinks or equivalent per week    Comment: weekends   Drug use:  No   Sexual activity: Yes    Partners: Male    Birth control/protection: Surgical    Comment: partner had vasectomy  Other Topics Concern   Not on file  Social History Narrative   Not on file   Social Determinants of Health   Financial Resource Strain: Not on file  Food Insecurity: Not on file  Transportation Needs: Not on file  Physical Activity: Insufficiently Active (12/16/2018)   Exercise Vital Sign    Days of Exercise per Week: 3 days    Minutes of Exercise per Session: 40 min  Stress: No Stress Concern Present (12/01/2017)   [employer] of Occupational Health - Occupational Stress Questionnaire    Feeling of Stress : Only a little  Social Connections: Not on file  Intimate Partner  Violence: Not on file    Family History  Problem Relation Age of Onset   Diabetes Father    Osteosarcoma Sister 20   CVA Maternal Grandfather 61   Heart failure Paternal Grandfather        90s   Breast cancer Neg Hx    Colon cancer Neg Hx    Esophageal cancer Neg Hx    Pancreatic cancer Neg Hx    Stomach cancer Neg Hx     ROS: no fevers or chills, productive cough, hemoptysis, dysphasia, odynophagia, melena, hematochezia, dysuria, hematuria, rash, seizure activity, orthopnea, PND, pedal edema, claudication. Remaining systems are negative.  Physical Exam: Well-developed well-nourished in no acute distress.  Skin is warm and dry.  HEENT is normal.  Neck is supple.  Chest is clear to auscultation with normal expansion.  Cardiovascular exam is regular rate and rhythm.  Abdominal exam nontender or distended. No masses palpated. Extremities show no edema. neuro grossly intact  A/P  1 palpitations-symptoms are reasonly well-controlled.  Will continue beta-blocker.  2 hyperlipidemia-recent significant weight loss; check lipids.  3 tobacco abuse-patient has been counseled on discontinuing.  Olga Millers, MD

## 2022-10-02 ENCOUNTER — Other Ambulatory Visit (HOSPITAL_COMMUNITY): Payer: Self-pay

## 2022-10-02 ENCOUNTER — Encounter: Payer: Self-pay | Admitting: Cardiology

## 2022-10-02 ENCOUNTER — Ambulatory Visit: Payer: Federal, State, Local not specified - PPO | Attending: Cardiology | Admitting: Cardiology

## 2022-10-02 VITALS — BP 124/76 | HR 71 | Ht 68.0 in | Wt 156.4 lb

## 2022-10-02 DIAGNOSIS — F17200 Nicotine dependence, unspecified, uncomplicated: Secondary | ICD-10-CM

## 2022-10-02 DIAGNOSIS — E7800 Pure hypercholesterolemia, unspecified: Secondary | ICD-10-CM | POA: Diagnosis not present

## 2022-10-02 DIAGNOSIS — R002 Palpitations: Secondary | ICD-10-CM | POA: Diagnosis not present

## 2022-10-02 MED ORDER — METOPROLOL SUCCINATE ER 50 MG PO TB24
50.0000 mg | ORAL_TABLET | Freq: Every day | ORAL | 3 refills | Status: DC
Start: 2022-10-02 — End: 2023-07-07
  Filled 2022-10-02 – 2022-10-21 (×2): qty 90, 90d supply, fill #0
  Filled 2023-01-16: qty 90, 90d supply, fill #1
  Filled 2023-04-12: qty 90, 90d supply, fill #2
  Filled 2023-06-26: qty 90, 90d supply, fill #3

## 2022-10-02 NOTE — Patient Instructions (Signed)
    Follow-Up: At [facility], you and your health needs are our priority.  As part of our continuing mission to provide you with exceptional heart care, we have created designated Provider Care Teams.  These Care Teams include your primary Cardiologist (physician) and Advanced Practice Providers (APPs -  Physician Assistants and Nurse Practitioners) who all work together to provide you with the care you need, when you need it.  We recommend signing up for the patient portal called "MyChart".  Sign up information is provided on this After Visit Summary.  MyChart is used to connect with patients for Virtual Visits (Telemedicine).  Patients are able to view lab/test results, encounter notes, upcoming appointments, etc.  Non-urgent messages can be sent to your provider as well.   To learn more about what you can do with MyChart, go to [email or web address].    Your next appointment:   2 year(s)  Provider:   Olga Millers, MD

## 2022-10-03 ENCOUNTER — Encounter: Payer: Self-pay | Admitting: Cardiology

## 2022-10-03 ENCOUNTER — Other Ambulatory Visit (HOSPITAL_COMMUNITY): Payer: Self-pay

## 2022-10-03 DIAGNOSIS — E7800 Pure hypercholesterolemia, unspecified: Secondary | ICD-10-CM

## 2022-10-03 LAB — LIPID PANEL
Chol/HDL Ratio: 2.6 ratio (ref 0.0–4.4)
Cholesterol, Total: 231 mg/dL — ABNORMAL HIGH (ref 100–199)
HDL: 88 mg/dL (ref >39)
LDL Chol Calc (NIH): 129 mg/dL — ABNORMAL HIGH (ref 0–99)
Triglycerides: 83 mg/dL (ref 0–149)
VLDL Cholesterol Cal: 14 mg/dL (ref 5–40)

## 2022-10-03 MED ORDER — EZETIMIBE 10 MG PO TABS
10.0000 mg | ORAL_TABLET | Freq: Every day | ORAL | 3 refills | Status: DC
  Filled 2022-10-03: qty 90, 90d supply, fill #0
  Filled 2023-01-05: qty 90, 90d supply, fill #1
  Filled 2023-04-26: qty 90, 90d supply, fill #2
  Filled 2023-06-26 – 2023-08-02 (×3): qty 90, 90d supply, fill #3

## 2022-10-06 NOTE — Progress Notes (Signed)
FOLLOW UP  Assessment and Plan:   Tachycardia Continue to cut down on caffeine, ETOH, smoking Continue toprol; symptoms have improved/resolved  Gastroesophageal reflux disease with esophagitis Continue PPI/H2 blocker, diet discussed  Thyroid disease Hypothyroidism-check TSH level, continue medications the same, reminded to take on an empty stomach 30-58mins before food.  -     TSH  Interstitial cystitis Avoid triggers, doing well with oxybutynin  Vitamin D deficiency Near goal at recent check; continue to recommend supplementation for goal of 70-100 Defer vitamin D level  Generalized anxiety disorder/insomnia continue medications, stress management techniques discussed, increase water, good sleep hygiene discussed, increase exercise, and increase veggies.  Medication management       - CBC -     CMP/GFR -     Magnesium  Hyperlipidemia/Statin intolerance Recently checked at cardiology, continue Zetia 10 mg QD Discussed with patient and she is in agreement Continue low cholesterol diet and exercise.    Tobacco use Discussed risks associated with tobacco use and advised to reduce or quit Patient is not currently ready to focus on weight loss, Chantix made her sick- trying to limit to less than 4 cigarettes a day Will follow up at the next visit  BMI 24 Change Wegovy to 1 mg SQ QW Long discussion about weight loss, diet, and exercise Recommended diet heavy in fruits and veggies and low in animal meats, cheeses, and dairy products, appropriate calorie intake Discussed appropriate weight for height  Follow up at next visit     Continue diet and meds as discussed. Further disposition pending results of labs. Discussed med's effects and SE's.   Over 30 minutes of exam, counseling, chart review, and critical decision making was performed.   Future Appointments  Date Time Provider Department Center  10/07/2022  9:30 AM Raynelle Dick, NP [facility] None  04/01/2023   3:00 PM Raynelle Dick, NP [facility] None    ----------------------------------------------------------------------------------------------------------------------  HPI 52 y.o. female  presents for follow up on anxiety/insomnia, tachycardia, cholesterol, glucose management, weight and vitamin D deficiency.    She saw Dr. McDiarmid for ICS, is now on oxybutynin and reports nearly fully resolved symptoms on med.   Had palpitations,  Holter showed some PVCs/PACs, ECHO from 10/2017 was unremarkable. She is on toprol XL with improvement in symptoms.   She is smoker, since she was 52 y/o, has been reducing, down from1 pack/day to <0.5 pack/day,has tried Chantix in the past and it made her sick.  So is trying to limit cigarettes to 4/day  She has had some GERD, states prilosec 20 mg daily, pepcid did not control symptoms  She has been prescribed xanax PRN for anxiety; she currently uses 0.25 mg at night if needed to sleep, has been reducing. She uses lexapro 10 mg daily. Symptoms are well controlled  BMI is Body mass index is 24.51 kg/m., she has been working on diet and exercise, doing intermittent fasting, 8:16. She is currently on Wegovy 0.5 mg SQ QW but having difficulty finding the prescription. She will have constipation and diarrhea alternating.  Wt Readings from Last 3 Encounters:  10/07/22 161 lb 3.2 oz (73.1 kg)  10/02/22 156 lb 6.4 oz (70.9 kg)  03/31/22 159 lb 3.2 oz (72.2 kg)   Her blood pressure has been controlled at home, today their BP is BP: 112/74  BP Readings from Last 3 Encounters:  10/07/22 112/74  10/02/22 124/76  03/31/22 90/62  She does workout. She denies chest pain, shortness of breath, dizziness.  She is not on cholesterol medication. Her cholesterol is not at goal. Lipids were recently completed at Dr. Jens Som, cardiology- he recommended Crestor 20 mg QD , difficulty tolerating statins and started back on Zetia 10 mg qd. The cholesterol last visit was:    Lab Results  Component Value Date   CHOL 231 (H) 10/02/2022   HDL 88 10/02/2022   LDLCALC 129 (H) 10/02/2022   TRIG 83 10/02/2022   CHOLHDL 2.6 10/02/2022   Last W0J in the office was:  Lab Results  Component Value Date   HGBA1C 5.3 03/31/2022   She is on thyroid medication levothyroxine 112 mcg daily.  Her medication was not changed last visit.   Lab Results  Component Value Date   TSH 4.71 (H) 03/31/2022   Patient is on Vitamin D supplement.   Lab Results  Component Value Date   VD25OH 82 03/31/2022       Current Medications:   Current Outpatient Medications (Endocrine & Metabolic):    levothyroxine (SYNTHROID) 112 MCG tablet, Take 1 tablet by mouth once daily on an empty stomach with only water for 30 minutes & no Antacid meds, Calcium or Magnesium for 4 hours & avoid Biotin.  Current Outpatient Medications (Cardiovascular):    ezetimibe (ZETIA) 10 MG tablet, Take 1 tablet (10 mg total) by mouth daily.   metoprolol succinate (TOPROL-XL) 50 MG 24 hr tablet, Take 1 tablet (50 mg total) by mouth daily. Take with or immediately following a meal.  Current Outpatient Medications (Respiratory):    budesonide-formoterol (SYMBICORT) 80-4.5 MCG/ACT inhaler, Inhale 2 puffs daily twice a day, wash your mouth afterwards   fluticasone (FLONASE) 50 MCG/ACT nasal spray, Place 2 sprays into both nostrils daily.    Current Outpatient Medications (Other):    ALPRAZolam (XANAX) 0.5 MG tablet, Take 0.5-1 tablets (0.25-0.5 mg total) by mouth 3 (three) times daily as needed for severe anxiety or panic. AVOID TAKING DAILY DUE TO RISK OF TOLERANCE AND ADDICTION . Must last 30 days   Cholecalciferol (VITAMIN D PO), Take 5,000 Units by mouth daily.   cyclobenzaprine (FLEXERIL) 10 MG tablet, Take 1 tablet (10 mg total) by mouth every 8 (eight) hours as needed for muscle spasms.   escitalopram (LEXAPRO) 20 MG tablet, Take 1 tablet (20 mg total) by mouth daily for mood   omeprazole (PRILOSEC) 20  MG capsule, Take 1 capsule (20 mg total) by mouth daily as needed.   oxybutynin (DITROPAN-XL) 10 MG 24 hr tablet, Take 1 tablet (10 mg total) by mouth daily for bladder control   Semaglutide-Weight Management (WEGOVY) 0.5 MG/0.5ML SOAJ, Inject 0.5 mg into the skin once a week.   Allergies:  Allergies  Allergen Reactions   Amoxicillin Rash and Other (See Comments)    FEVER   Bisoprolol Anxiety   Metoprolol Tartrate Palpitations   Verapamil Rash     Medical History:  Past Medical History:  Diagnosis Date   Anxiety    Breast hypertrophy 03/2014   GERD (gastroesophageal reflux disease)    Hyperlipidemia    Hypothyroidism    Interstitial cystitis    Family history- Reviewed and unchanged Social history- Reviewed and unchanged   Review of Systems:  Review of Systems  Constitutional:  Negative for malaise/fatigue and weight loss.  HENT:  Negative for hearing loss and tinnitus.   Eyes:  Negative for blurred vision and double vision.  Respiratory:  Negative for cough, shortness of breath and wheezing.   Cardiovascular:  Negative for chest pain, palpitations, orthopnea,  claudication and leg swelling.  Gastrointestinal:  Negative for abdominal pain, blood in stool, constipation, diarrhea, heartburn, melena, nausea and vomiting.  Genitourinary: Negative.   Musculoskeletal:  Negative for back pain, joint pain and myalgias.  Skin:  Negative for rash.  Neurological:  Negative for dizziness, tingling, sensory change, weakness and headaches.  Endo/Heme/Allergies:  Negative for polydipsia.  Psychiatric/Behavioral:  The patient is nervous/anxious. The patient does not have insomnia.   All other systems reviewed and are negative.    Physical Exam: BP 112/74   Pulse 74   Temp (!) 97.5 F (36.4 C)   Ht 5\' 8"  (1.727 m)   Wt 161 lb 3.2 oz (73.1 kg)   SpO2 99%   BMI 24.51 kg/m  Wt Readings from Last 3 Encounters:  10/07/22 161 lb 3.2 oz (73.1 kg)  10/02/22 156 lb 6.4 oz (70.9 kg)   03/31/22 159 lb 3.2 oz (72.2 kg)   General Appearance: Well nourished, in no apparent distress. Eyes: PERRLA, EOMs, conjunctiva no swelling or erythema Sinuses: No Frontal/maxillary tenderness ENT/Mouth: Ext aud canals clear, TMs without erythema, bulging. No erythema, swelling, or exudate on post pharynx.  Tonsils not swollen or erythematous. Hearing normal.  Neck: Supple, thyroid normal.  Respiratory: Respiratory effort normal, BS equal bilaterally without rales, rhonchi, wheezing or stridor.  Cardio: RRR with no MRGs. Brisk peripheral pulses without edema.  Abdomen: Soft, + BS.  Non tender, no guarding, rebound, hernias, masses. Lymphatics: Non tender without lymphadenopathy.  Musculoskeletal: Full ROM, 5/5 strength, Normal gait. No midline lumbar tenderness, mild R SI tenderness, + R straight leg raise Skin: Warm, dry without rashes, lesions, ecchymosis.  Neuro: Cranial nerves intact. No cerebellar symptoms. BLE sensation intact Psych: Awake and oriented X 3, normal affect, Insight and Judgment appropriate.    Raynelle Dick, NP 9:24 AM [facility]

## 2022-10-07 ENCOUNTER — Encounter: Payer: Self-pay | Admitting: Nurse Practitioner

## 2022-10-07 ENCOUNTER — Ambulatory Visit (INDEPENDENT_AMBULATORY_CARE_PROVIDER_SITE_OTHER): Payer: Federal, State, Local not specified - PPO | Admitting: Nurse Practitioner

## 2022-10-07 ENCOUNTER — Other Ambulatory Visit (HOSPITAL_COMMUNITY): Payer: Self-pay

## 2022-10-07 ENCOUNTER — Telehealth: Payer: Self-pay

## 2022-10-07 VITALS — BP 112/74 | HR 74 | Temp 97.5°F | Ht 68.0 in | Wt 161.2 lb

## 2022-10-07 DIAGNOSIS — E669 Obesity, unspecified: Secondary | ICD-10-CM

## 2022-10-07 DIAGNOSIS — E559 Vitamin D deficiency, unspecified: Secondary | ICD-10-CM

## 2022-10-07 DIAGNOSIS — E782 Mixed hyperlipidemia: Secondary | ICD-10-CM

## 2022-10-07 DIAGNOSIS — F17200 Nicotine dependence, unspecified, uncomplicated: Secondary | ICD-10-CM

## 2022-10-07 DIAGNOSIS — N3281 Overactive bladder: Secondary | ICD-10-CM | POA: Diagnosis not present

## 2022-10-07 DIAGNOSIS — K2100 Gastro-esophageal reflux disease with esophagitis, without bleeding: Secondary | ICD-10-CM | POA: Diagnosis not present

## 2022-10-07 DIAGNOSIS — Z789 Other specified health status: Secondary | ICD-10-CM

## 2022-10-07 DIAGNOSIS — N3010 Interstitial cystitis (chronic) without hematuria: Secondary | ICD-10-CM

## 2022-10-07 DIAGNOSIS — F411 Generalized anxiety disorder: Secondary | ICD-10-CM

## 2022-10-07 DIAGNOSIS — E079 Disorder of thyroid, unspecified: Secondary | ICD-10-CM | POA: Diagnosis not present

## 2022-10-07 DIAGNOSIS — Z6824 Body mass index (BMI) 24.0-24.9, adult: Secondary | ICD-10-CM

## 2022-10-07 DIAGNOSIS — R000 Tachycardia, unspecified: Secondary | ICD-10-CM

## 2022-10-07 DIAGNOSIS — Z79899 Other long term (current) drug therapy: Secondary | ICD-10-CM | POA: Diagnosis not present

## 2022-10-07 DIAGNOSIS — R7309 Other abnormal glucose: Secondary | ICD-10-CM

## 2022-10-07 LAB — CBC WITH DIFFERENTIAL/PLATELET
Absolute Monocytes: 507 cells/uL (ref 200–950)
Basophils Absolute: 51 cells/uL (ref 0–200)
Eosinophils Absolute: 131 cells/uL (ref 15–500)

## 2022-10-07 MED ORDER — WEGOVY 1 MG/0.5ML ~~LOC~~ SOAJ
1.0000 mg | SUBCUTANEOUS | 2 refills | Status: DC
Start: 2022-10-07 — End: 2023-01-09
  Filled 2022-10-07 (×2): qty 2, 28d supply, fill #0
  Filled 2022-11-06: qty 2, 28d supply, fill #1
  Filled 2022-12-04: qty 2, 28d supply, fill #2

## 2022-10-07 NOTE — Telephone Encounter (Addendum)
Prior auth completed and submitted and has been approved through 10/07/23.

## 2022-10-07 NOTE — Patient Instructions (Signed)

## 2022-10-08 LAB — CBC WITH DIFFERENTIAL/PLATELET
Basophils Relative: 0.9 %
Eosinophils Relative: 2.3 %
HCT: 39.9 % (ref 35.0–45.0)
Hemoglobin: 13.4 g/dL (ref 11.7–15.5)
Lymphs Abs: 1562 cells/uL (ref 850–3900)
MCH: 30.6 pg (ref 27.0–33.0)
MCHC: 33.6 g/dL (ref 32.0–36.0)
MCV: 91.1 fL (ref 80.0–100.0)
MPV: 11.4 fL (ref 7.5–12.5)
Monocytes Relative: 8.9 %
Neutro Abs: 3449 cells/uL (ref 1500–7800)
Neutrophils Relative %: 60.5 %
Platelets: 265 10*3/uL (ref 140–400)
RBC: 4.38 10*6/uL (ref 3.80–5.10)
RDW: 11.7 % (ref 11.0–15.0)
Total Lymphocyte: 27.4 %
WBC: 5.7 10*3/uL (ref 3.8–10.8)

## 2022-10-08 LAB — COMPLETE METABOLIC PANEL WITH GFR
AG Ratio: 1.5 (calc) (ref 1.0–2.5)
ALT: 21 U/L (ref 6–29)
AST: 20 U/L (ref 10–35)
Albumin: 4.2 g/dL (ref 3.6–5.1)
Alkaline phosphatase (APISO): 47 U/L (ref 31–125)
BUN: 10 mg/dL (ref 7–25)
CO2: 30 mmol/L (ref 20–32)
Calcium: 9.2 mg/dL (ref 8.6–10.2)
Chloride: 102 mmol/L (ref 98–110)
Creat: 0.88 mg/dL (ref 0.50–0.99)
Globulin: 2.8 g/dL (calc) (ref 1.9–3.7)
Glucose, Bld: 83 mg/dL (ref 65–99)
Potassium: 4.7 mmol/L (ref 3.5–5.3)
Sodium: 140 mmol/L (ref 135–146)
Total Bilirubin: 0.5 mg/dL (ref 0.2–1.2)
Total Protein: 7 g/dL (ref 6.1–8.1)
eGFR: 81 mL/min/{1.73_m2} (ref >=60)

## 2022-10-08 LAB — MAGNESIUM: Magnesium: 2.2 mg/dL (ref 1.5–2.5)

## 2022-10-08 LAB — TSH: TSH: 5.11 mIU/L — ABNORMAL HIGH

## 2022-10-13 ENCOUNTER — Other Ambulatory Visit: Payer: Self-pay

## 2022-10-21 ENCOUNTER — Other Ambulatory Visit: Payer: Self-pay

## 2022-10-25 ENCOUNTER — Other Ambulatory Visit: Payer: Self-pay | Admitting: Internal Medicine

## 2022-10-25 ENCOUNTER — Other Ambulatory Visit (HOSPITAL_COMMUNITY): Payer: Self-pay

## 2022-10-25 MED ORDER — LEVOTHYROXINE SODIUM 112 MCG PO TABS
112.0000 ug | ORAL_TABLET | Freq: Every day | ORAL | 3 refills | Status: DC
  Filled 2022-10-25 – 2022-11-06 (×2): qty 90, 90d supply, fill #0

## 2022-10-27 ENCOUNTER — Other Ambulatory Visit (HOSPITAL_COMMUNITY): Payer: Self-pay

## 2022-11-04 ENCOUNTER — Other Ambulatory Visit (HOSPITAL_COMMUNITY): Payer: Self-pay

## 2022-11-06 ENCOUNTER — Encounter: Payer: Self-pay | Admitting: Internal Medicine

## 2022-11-06 ENCOUNTER — Other Ambulatory Visit: Payer: Self-pay

## 2022-11-06 ENCOUNTER — Other Ambulatory Visit (HOSPITAL_COMMUNITY): Payer: Self-pay

## 2022-11-21 ENCOUNTER — Encounter: Admit: 2022-11-21 | Payer: PRIVATE HEALTH INSURANCE | Primary: Internal Medicine

## 2022-12-03 ENCOUNTER — Ambulatory Visit: Admit: 2022-12-03 | Payer: Managed Care (Private) | Attending: Internal Medicine | Primary: Internal Medicine

## 2022-12-03 ENCOUNTER — Encounter: Admit: 2022-12-03 | Payer: Managed Care (Private) | Attending: Internal Medicine | Primary: Internal Medicine

## 2022-12-03 ENCOUNTER — Encounter: Admit: 2022-12-03 | Payer: PRIVATE HEALTH INSURANCE | Attending: Internal Medicine | Primary: Internal Medicine

## 2022-12-10 ENCOUNTER — Encounter: Payer: Self-pay | Admitting: Internal Medicine

## 2022-12-14 ENCOUNTER — Other Ambulatory Visit: Payer: Self-pay | Admitting: Nurse Practitioner

## 2022-12-14 DIAGNOSIS — K2100 Gastro-esophageal reflux disease with esophagitis, without bleeding: Secondary | ICD-10-CM

## 2022-12-15 ENCOUNTER — Other Ambulatory Visit (HOSPITAL_COMMUNITY): Payer: Self-pay

## 2022-12-15 MED ORDER — OMEPRAZOLE 20 MG PO CPDR
20.0000 mg | DELAYED_RELEASE_CAPSULE | Freq: Every day | ORAL | 1 refills | Status: DC | PRN
Start: 2022-12-15 — End: 2023-06-09
  Filled 2022-12-15: qty 90, 90d supply, fill #0
  Filled 2023-03-15: qty 90, 90d supply, fill #1

## 2023-01-08 NOTE — Progress Notes (Signed)
FOLLOW UP  Assessment and Plan:   Tachycardia Continue to cut down on caffeine, ETOH, smoking Continue toprol; symptoms have improved/resolved  Gastroesophageal reflux disease with esophagitis Continue PPI/H2 blocker, diet discussed  Thyroid disease Hypothyroidism-check TSH level, continue medications the same, reminded to take on an empty stomach 30-43mins before food.  -     TSH  Abnormal Glucose Continue diet and exercise - CMP  Interstitial cystitis Avoid triggers, doing well with oxybutynin  Vitamin D deficiency Near goal at recent check; continue to recommend supplementation for goal of 70-100 Defer vitamin D level  Generalized anxiety disorder/insomnia continue medications, stress management techniques discussed, increase water, good sleep hygiene discussed, increase exercise, and increase veggies.  Medication management - CBC -     CMP/GFR -     Magnesium  Hyperlipidemia/Statin intolerance Continue Zetia 10 mg QD Continue low cholesterol diet and exercise.  Check lipid panel.  -     Lipid panel  Tobacco use Discussed risks associated with tobacco use and advised to reduce or quit Patient is not currently ready to focus on weight loss, Chantix made her sick- trying to limit to less than 8 cigarettes a day Will follow up at the next visit  History of Obesity Continue Wegovy 0.5 mg SQ QW for maintenance and diet/exercise Long discussion about weight loss, diet, and exercise Recommended diet heavy in fruits and veggies and low in animal meats, cheeses, and dairy products, appropriate calorie intake Discussed appropriate weight for height  Follow up at next visit  Varicose veins of right calf Encouraged wearing compression socks to work and when exercising Continue to monitor   Continue diet and meds as discussed. Further disposition pending results of labs. Discussed med's effects and SE's.   Over 30 minutes of exam, counseling, chart review, and  critical decision making was performed.   Future Appointments  Date Time Provider Department Center  04/01/2023  3:00 PM Raynelle Dick, NP [facility] None    ----------------------------------------------------------------------------------------------------------------------  HPI 52 y.o. female  presents for follow up on anxiety/insomnia, tachycardia, cholesterol, glucose management, weight and vitamin D deficiency.    She saw Dr. McDiarmid for ICS, is now on oxybutynin and reports nearly fully resolved symptoms on med.   Had palpitations,  Holter showed some PVCs/PACs, ECHO from 10/2017 was unremarkable. She is on toprol XL with improvement in symptoms.   She is smoker, since she was 52 y/o, has been reducing, down from1 pack/day to <0.5 pack/day,has tried Chantix in the past and it made her sick.  So is trying to limit cigarettes to 8/day  She has had some GERD, takes prilosec 20 mg daily, pepcid did not control symptoms  She has been prescribed xanax PRN for anxiety; she currently uses 0.25 mg at night if needed to sleep, has been reducing. Last filled 08/26/22 # 55. She uses lexapro 10 mg daily. Symptoms are well controlled  Has varicose veins on back of right lower leg, not painful but appearance bothers her. Will wear compression when running but not wearing to work.   Had abnormal breast screening 03/2020, had [place] 04/26/2020 showed possible fat necrosis, mammogram 11/27/20 and Left breast U/S showed stability. Mammogram 03/25/22 was negative  BMI is Body mass index is 23.08 kg/m., she has been working on diet and exercise, doing intermittent fasting, 8:16. She is currently on Wegovy 1 mg and said it is too strong, nausea and wants to go to 0.5 mg. High weight was 212 Wt Readings from Last 3  Encounters:  01/09/23 151 lb 12.8 oz (68.9 kg)  10/07/22 161 lb 3.2 oz (73.1 kg)  10/02/22 156 lb 6.4 oz (70.9 kg)   Her blood pressure has been controlled at home, today their BP is BP:  110/68  BP Readings from Last 3 Encounters:  01/09/23 110/68  10/07/22 112/74  10/02/22 124/76  She does workout. She denies chest pain, shortness of breath, dizziness.    She is on cholesterol medication, restarted zetia 10 mg every day at last visit after blood work. Her cholesterol is not at goal. She has had myalgias with rosuvastatin and atorvastatin. The cholesterol last visit was:   Lab Results  Component Value Date   CHOL 231 (H) 10/02/2022   HDL 88 10/02/2022   LDLCALC 129 (H) 10/02/2022   TRIG 83 10/02/2022   CHOLHDL 2.6 10/02/2022   Last W1X in the office was:  Lab Results  Component Value Date   HGBA1C 5.3 03/31/2022   She is on thyroid medication.she is taking levothyroxine 112 mcg 1 tab daily except Monday and Thursday takes 1.5 tabs. Lab Results  Component Value Date   TSH 5.11 (H) 10/07/2022   Patient is on Vitamin D supplement.   Lab Results  Component Value Date   VD25OH 82 03/31/2022       Current Medications:   Current Outpatient Medications (Endocrine & Metabolic):    levothyroxine (SYNTHROID) 112 MCG tablet, Take 1 tablet (112 mcg) by mouth daily on an empty stomach. No antacid meds, Calcium or Magnesium for 4 hours; avoid Biotin.  Current Outpatient Medications (Cardiovascular):    ezetimibe (ZETIA) 10 MG tablet, Take 1 tablet (10 mg total) by mouth daily.   metoprolol succinate (TOPROL-XL) 50 MG 24 hr tablet, Take 1 tablet (50 mg total) by mouth daily. Take with or immediately following a meal.  Current Outpatient Medications (Respiratory):    budesonide-formoterol (SYMBICORT) 80-4.5 MCG/ACT inhaler, Inhale 2 puffs daily twice a day, wash your mouth afterwards   fluticasone (FLONASE) 50 MCG/ACT nasal spray, Place 2 sprays into both nostrils daily.    Current Outpatient Medications (Other):    ALPRAZolam (XANAX) 0.5 MG tablet, Take 0.5-1 tablets (0.25-0.5 mg total) by mouth 3 (three) times daily as needed for severe anxiety or panic. AVOID  TAKING DAILY DUE TO RISK OF TOLERANCE AND ADDICTION . Must last 30 days   Cholecalciferol (VITAMIN D PO), Take 5,000 Units by mouth daily.   cyclobenzaprine (FLEXERIL) 10 MG tablet, Take 1 tablet (10 mg total) by mouth every 8 (eight) hours as needed for muscle spasms.   escitalopram (LEXAPRO) 20 MG tablet, Take 1 tablet (20 mg total) by mouth daily for mood   omeprazole (PRILOSEC) 20 MG capsule, Take 1 capsule (20 mg total) by mouth daily as needed.   oxybutynin (DITROPAN-XL) 10 MG 24 hr tablet, Take 1 tablet (10 mg total) by mouth daily for bladder control   Semaglutide-Weight Management (WEGOVY) 1 MG/0.5ML SOAJ, Inject 1 mg into the skin once a week.   Allergies:  Allergies  Allergen Reactions   Amoxicillin Rash and Other (See Comments)    FEVER   Bisoprolol Anxiety   Metoprolol Tartrate Palpitations   Verapamil Rash     Medical History:  Past Medical History:  Diagnosis Date   Anxiety    Breast hypertrophy 03/2014   GERD (gastroesophageal reflux disease)    Hyperlipidemia    Hypothyroidism    Interstitial cystitis    Family history- Reviewed and unchanged Social history- Reviewed and unchanged  Review of Systems:  Review of Systems  Constitutional:  Negative for malaise/fatigue and weight loss.  HENT:  Negative for hearing loss and tinnitus.   Eyes:  Negative for blurred vision and double vision.  Respiratory:  Negative for cough, shortness of breath and wheezing.   Cardiovascular:  Negative for chest pain, palpitations, orthopnea, claudication and leg swelling.  Gastrointestinal:  Negative for abdominal pain, blood in stool, constipation, diarrhea, heartburn, melena, nausea and vomiting.  Genitourinary: Negative.   Musculoskeletal:  Positive for back pain (lumbar stenosis). Negative for joint pain and myalgias.  Skin:  Negative for rash.  Neurological:  Negative for dizziness, tingling, sensory change, weakness and headaches.  Endo/Heme/Allergies:  Negative for  polydipsia.  Psychiatric/Behavioral:  The patient is nervous/anxious. The patient does not have insomnia.   All other systems reviewed and are negative.    Physical Exam: BP 110/68   Pulse 64   Temp 97.7 F (36.5 C)   Ht 5\' 8"  (1.727 m)   Wt 151 lb 12.8 oz (68.9 kg)   SpO2 99%   BMI 23.08 kg/m  Wt Readings from Last 3 Encounters:  01/09/23 151 lb 12.8 oz (68.9 kg)  10/07/22 161 lb 3.2 oz (73.1 kg)  10/02/22 156 lb 6.4 oz (70.9 kg)   General Appearance: Well nourished, in no apparent distress. Eyes: PERRLA, EOMs, conjunctiva no swelling or erythema Sinuses: No Frontal/maxillary tenderness ENT/Mouth: Ext aud canals clear, TMs without erythema, bulging. No erythema, swelling, or exudate on post pharynx. Hearing normal.  Neck: Supple, thyroid normal.  Respiratory: Respiratory effort normal, BS equal bilaterally without rales, rhonchi, wheezing or stridor.  Cardio: RRR with no MRGs. Brisk peripheral pulses without edema. Some varicosities on back of right lower leg Abdomen: Soft, + BS.  Non tender, no guarding, rebound, hernias, masses. Lymphatics: Non tender without lymphadenopathy.  Musculoskeletal: Full ROM, 5/5 strength, Normal gait.  Skin: Warm, dry without rashes, lesions, ecchymosis.  Neuro: Cranial nerves intact. No cerebellar symptoms. Psych: Awake and oriented X 3, normal affect, Insight and Judgment appropriate.    Raynelle Dick, NP 11:10 AM [facility]

## 2023-01-09 ENCOUNTER — Other Ambulatory Visit (HOSPITAL_COMMUNITY): Payer: Self-pay

## 2023-01-09 ENCOUNTER — Encounter: Payer: Self-pay | Admitting: Nurse Practitioner

## 2023-01-09 ENCOUNTER — Ambulatory Visit: Payer: Federal, State, Local not specified - PPO | Admitting: Nurse Practitioner

## 2023-01-09 VITALS — BP 110/68 | HR 64 | Temp 97.7°F | Ht 68.0 in | Wt 151.8 lb

## 2023-01-09 DIAGNOSIS — Z789 Other specified health status: Secondary | ICD-10-CM

## 2023-01-09 DIAGNOSIS — E782 Mixed hyperlipidemia: Secondary | ICD-10-CM

## 2023-01-09 DIAGNOSIS — R7309 Other abnormal glucose: Secondary | ICD-10-CM

## 2023-01-09 DIAGNOSIS — E669 Obesity, unspecified: Secondary | ICD-10-CM

## 2023-01-09 DIAGNOSIS — E559 Vitamin D deficiency, unspecified: Secondary | ICD-10-CM

## 2023-01-09 DIAGNOSIS — Z8639 Personal history of other endocrine, nutritional and metabolic disease: Secondary | ICD-10-CM

## 2023-01-09 DIAGNOSIS — Z79899 Other long term (current) drug therapy: Secondary | ICD-10-CM | POA: Diagnosis not present

## 2023-01-09 DIAGNOSIS — K2100 Gastro-esophageal reflux disease with esophagitis, without bleeding: Secondary | ICD-10-CM | POA: Diagnosis not present

## 2023-01-09 DIAGNOSIS — F411 Generalized anxiety disorder: Secondary | ICD-10-CM

## 2023-01-09 DIAGNOSIS — N3010 Interstitial cystitis (chronic) without hematuria: Secondary | ICD-10-CM

## 2023-01-09 DIAGNOSIS — F17200 Nicotine dependence, unspecified, uncomplicated: Secondary | ICD-10-CM

## 2023-01-09 DIAGNOSIS — G4700 Insomnia, unspecified: Secondary | ICD-10-CM

## 2023-01-09 DIAGNOSIS — N3281 Overactive bladder: Secondary | ICD-10-CM

## 2023-01-09 DIAGNOSIS — E039 Hypothyroidism, unspecified: Secondary | ICD-10-CM | POA: Diagnosis not present

## 2023-01-09 DIAGNOSIS — I8391 Asymptomatic varicose veins of right lower extremity: Secondary | ICD-10-CM

## 2023-01-09 DIAGNOSIS — R000 Tachycardia, unspecified: Secondary | ICD-10-CM

## 2023-01-09 MED ORDER — WEGOVY 0.5 MG/0.5ML ~~LOC~~ SOAJ
0.5000 mg | SUBCUTANEOUS | 2 refills | Status: DC
  Filled 2023-01-09: qty 2, 28d supply, fill #0
  Filled 2023-02-05: qty 2, 28d supply, fill #1
  Filled 2023-03-08 – 2023-03-09 (×2): qty 2, 28d supply, fill #2

## 2023-01-09 NOTE — Patient Instructions (Signed)

## 2023-01-10 LAB — COMPLETE METABOLIC PANEL WITH GFR
AG Ratio: 1.5 (calc) (ref 1.0–2.5)
ALT: 17 U/L (ref 6–29)
AST: 18 U/L (ref 10–35)
Albumin: 4.3 g/dL (ref 3.6–5.1)
Alkaline phosphatase (APISO): 46 U/L (ref 31–125)
BUN: 9 mg/dL (ref 7–25)
CO2: 32 mmol/L (ref 20–32)
Calcium: 9.7 mg/dL (ref 8.6–10.2)
Chloride: 101 mmol/L (ref 98–110)
Creat: 0.83 mg/dL (ref 0.50–0.99)
Globulin: 2.8 g/dL (ref 1.9–3.7)
Glucose, Bld: 84 mg/dL (ref 65–99)
Potassium: 5.1 mmol/L (ref 3.5–5.3)
Sodium: 139 mmol/L (ref 135–146)
Total Bilirubin: 0.4 mg/dL (ref 0.2–1.2)
Total Protein: 7.1 g/dL (ref 6.1–8.1)
eGFR: 86 mL/min/{1.73_m2} (ref >=60)

## 2023-01-10 LAB — CBC WITH DIFFERENTIAL/PLATELET
Absolute Monocytes: 355 {cells}/uL (ref 200–950)
Basophils Absolute: 48 {cells}/uL (ref 0–200)
Basophils Relative: 1 %
Eosinophils Absolute: 101 {cells}/uL (ref 15–500)
Eosinophils Relative: 2.1 %
HCT: 39.3 % (ref 35.0–45.0)
Hemoglobin: 12.9 g/dL (ref 11.7–15.5)
Lymphs Abs: 1824 {cells}/uL (ref 850–3900)
MCH: 30 pg (ref 27.0–33.0)
MCHC: 32.8 g/dL (ref 32.0–36.0)
MCV: 91.4 fL (ref 80.0–100.0)
MPV: 12.1 fL (ref 7.5–12.5)
Monocytes Relative: 7.4 %
Neutro Abs: 2472 {cells}/uL (ref 1500–7800)
Neutrophils Relative %: 51.5 %
Platelets: 263 10*3/uL (ref 140–400)
RBC: 4.3 10*6/uL (ref 3.80–5.10)
RDW: 11.5 % (ref 11.0–15.0)
Total Lymphocyte: 38 %
WBC: 4.8 10*3/uL (ref 3.8–10.8)

## 2023-01-10 LAB — LIPID PANEL
Cholesterol: 178 mg/dL (ref <200)
HDL: 79 mg/dL (ref >=50)
LDL Cholesterol (Calc): 83 mg/dL
Non-HDL Cholesterol (Calc): 99 mg/dL (ref <130)
Total CHOL/HDL Ratio: 2.3 (calc) (ref <5.0)
Triglycerides: 77 mg/dL (ref <150)

## 2023-01-10 LAB — TSH: TSH: 0.13 m[IU]/L — ABNORMAL LOW

## 2023-01-10 LAB — MAGNESIUM: Magnesium: 2.2 mg/dL (ref 1.5–2.5)

## 2023-01-12 ENCOUNTER — Other Ambulatory Visit: Payer: Self-pay | Admitting: Nurse Practitioner

## 2023-01-12 ENCOUNTER — Other Ambulatory Visit (HOSPITAL_COMMUNITY): Payer: Self-pay

## 2023-01-12 DIAGNOSIS — E039 Hypothyroidism, unspecified: Secondary | ICD-10-CM

## 2023-01-12 MED ORDER — LEVOTHYROXINE SODIUM 100 MCG PO TABS
100.0000 ug | ORAL_TABLET | Freq: Every day | ORAL | 1 refills | Status: AC
Start: 2023-01-12 — End: ?
  Filled 2023-01-12: qty 90, 90d supply, fill #0
  Filled 2023-05-15: qty 90, 90d supply, fill #1

## 2023-01-29 ENCOUNTER — Encounter: Admit: 2023-01-29 | Payer: PRIVATE HEALTH INSURANCE | Attending: Internal Medicine | Primary: Internal Medicine

## 2023-01-30 ENCOUNTER — Telehealth: Admit: 2023-01-30 | Payer: PRIVATE HEALTH INSURANCE | Attending: Internal Medicine | Primary: Internal Medicine

## 2023-02-05 ENCOUNTER — Other Ambulatory Visit: Payer: Self-pay | Admitting: Nurse Practitioner

## 2023-02-05 DIAGNOSIS — F411 Generalized anxiety disorder: Secondary | ICD-10-CM

## 2023-02-09 ENCOUNTER — Other Ambulatory Visit (HOSPITAL_COMMUNITY): Payer: Self-pay

## 2023-02-09 MED ORDER — ALPRAZOLAM 0.5 MG PO TABS
0.2500 mg | ORAL_TABLET | Freq: Three times a day (TID) | ORAL | 0 refills | Status: DC | PRN
  Filled 2023-02-09: qty 50, 30d supply, fill #0

## 2023-03-08 ENCOUNTER — Encounter (HOSPITAL_COMMUNITY): Payer: Self-pay

## 2023-03-09 ENCOUNTER — Other Ambulatory Visit: Payer: Self-pay

## 2023-03-09 ENCOUNTER — Other Ambulatory Visit (HOSPITAL_COMMUNITY): Payer: Self-pay

## 2023-03-11 ENCOUNTER — Encounter: Admit: 2023-03-11 | Payer: PRIVATE HEALTH INSURANCE | Attending: Internal Medicine | Primary: Internal Medicine

## 2023-03-11 MED ORDER — METOPROLOL SUCCINATE ER 25 MG TABLET,EXTENDED RELEASE 24 HR
25 | ORAL_TABLET | ORAL | 2 refills | 90.00000 days | Status: AC
Start: 2023-03-11 — End: 2023-09-07

## 2023-04-01 ENCOUNTER — Encounter: Payer: Federal, State, Local not specified - PPO | Admitting: Nurse Practitioner

## 2023-04-14 IMAGING — XA Imaging study
2 series · 2 of 2 positions shown · non-contrast
Comparison: none

CLINICAL DATA: Lower lumbar back pain. Improvement following the
initial epidural injection but with some recurrence/persistence,
particularly low back pain.

[Series 1: ortho adipose · 1 of 1 slices shown (1 of 2)]
[im 1/1]
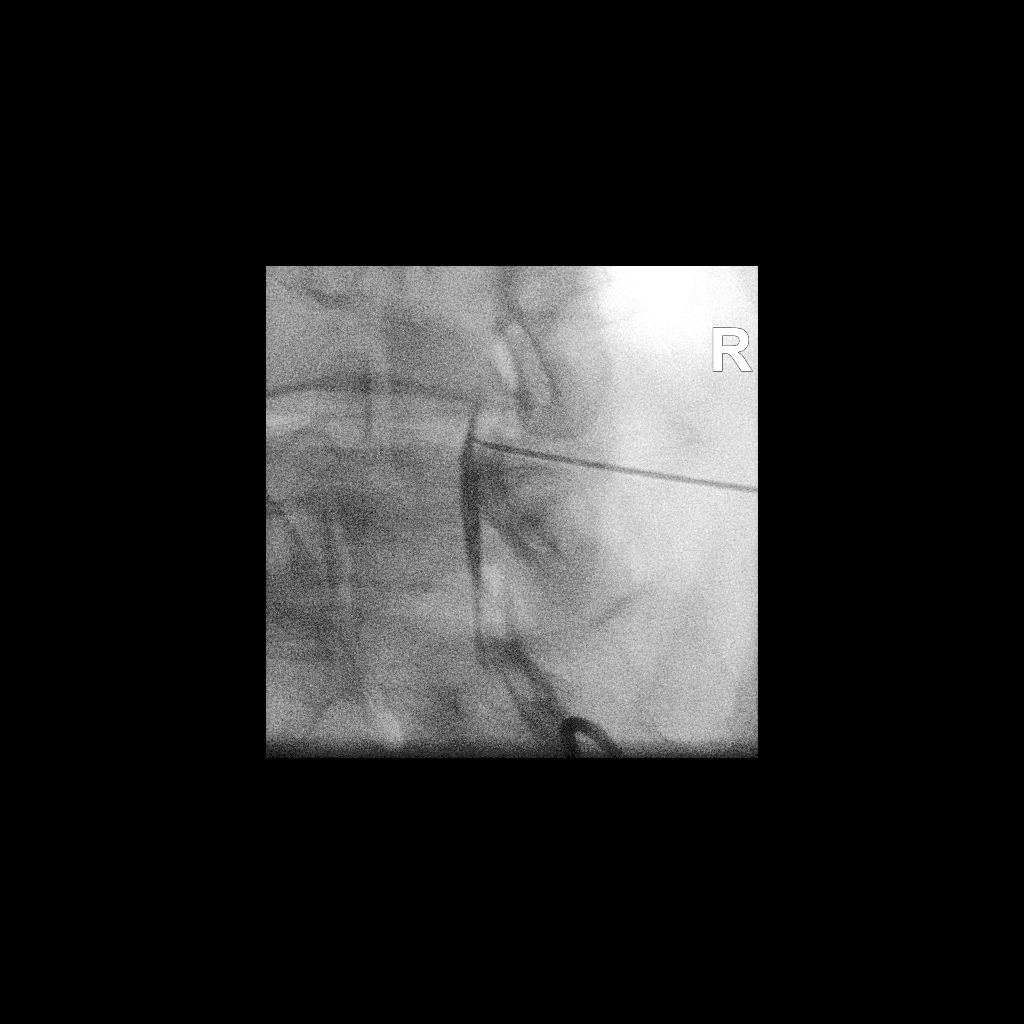

[Series 2: ortho adipose · 1 of 1 slices shown (2 of 2)]
[im 1/1]
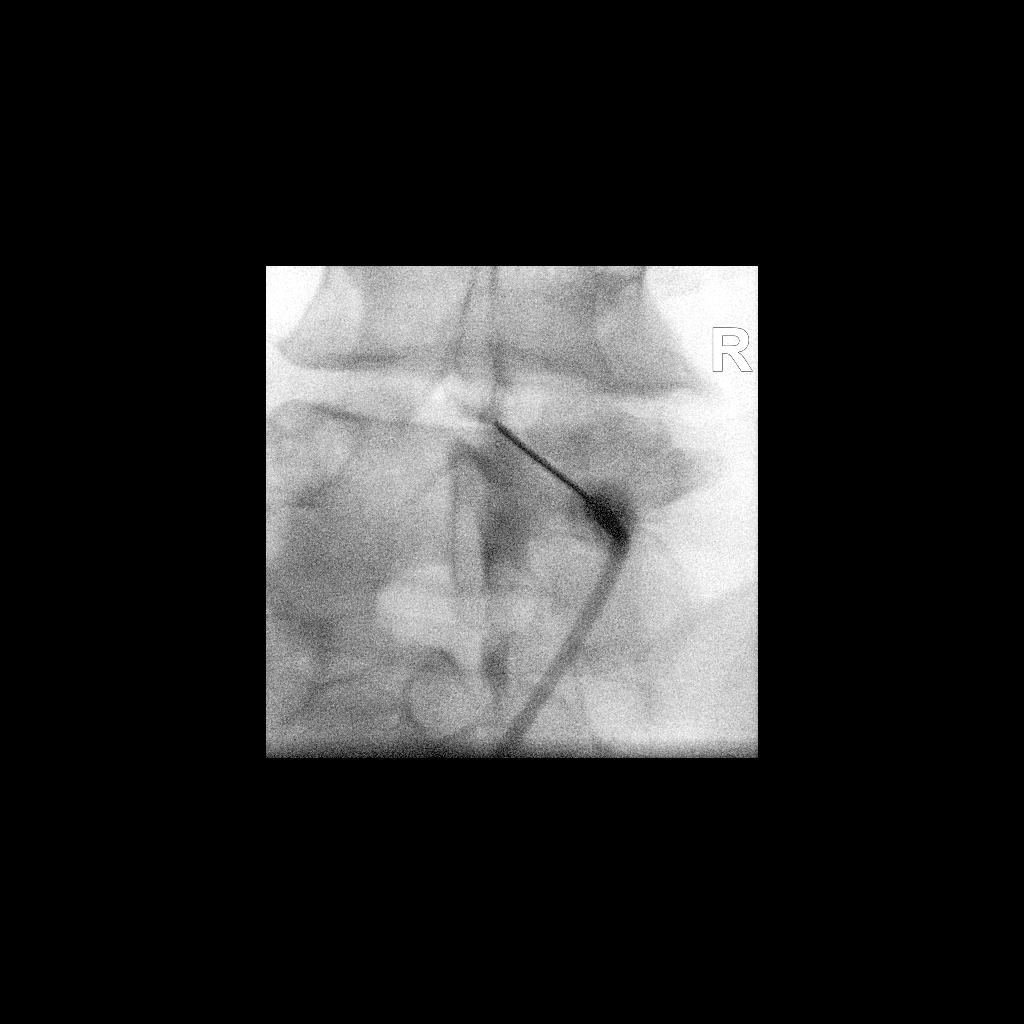

[2 of 2 positions shown; findings below may reference images not displayed]

FLUOROSCOPY TIME:  0 minutes 32 seconds. 23.88 micro gray meter
squared

PROCEDURE:
The procedure, risks, benefits, and alternatives were explained to
the patient. Questions regarding the procedure were encouraged and
answered. The patient understands and consents to the procedure.

LUMBAR EPIDURAL INJECTION:

An interlaminar approach was performed on right at L4-5. The
overlying skin was cleansed and anesthetized. A 20 gauge epidural
needle was advanced using loss-of-resistance technique.

DIAGNOSTIC EPIDURAL INJECTION:

Injection of Isovue-M 200 shows a good epidural pattern with spread
above and below the level of needle placement, primarily on the
right, but to both sides. No vascular opacification is seen.

THERAPEUTIC EPIDURAL INJECTION:

Eighty mg of Depo-Medrol mixed with 2.5 cc 1% lidocaine were
instilled. The procedure was well-tolerated, and the patient was
discharged thirty minutes following the injection in good condition.

COMPLICATIONS:
None
IMPRESSION: Technically successful epidural injection on the right at L4-5 # 2.

## 2023-04-26 ENCOUNTER — Other Ambulatory Visit: Payer: Self-pay | Admitting: Nurse Practitioner

## 2023-04-26 DIAGNOSIS — Z8639 Personal history of other endocrine, nutritional and metabolic disease: Secondary | ICD-10-CM

## 2023-04-27 ENCOUNTER — Other Ambulatory Visit: Payer: Self-pay

## 2023-04-27 ENCOUNTER — Other Ambulatory Visit (HOSPITAL_COMMUNITY): Payer: Self-pay

## 2023-04-27 MED ORDER — WEGOVY 0.5 MG/0.5ML ~~LOC~~ SOAJ
0.5000 mg | SUBCUTANEOUS | 2 refills | Status: AC
Start: 2023-04-27 — End: ?
  Filled 2023-04-27: qty 2, 28d supply, fill #0
  Filled 2023-05-21 – 2023-05-25 (×2): qty 2, 28d supply, fill #1

## 2023-04-27 MED ORDER — OXYBUTYNIN CHLORIDE ER 10 MG PO TB24
10.0000 mg | ORAL_TABLET | Freq: Every day | ORAL | 3 refills | Status: DC
  Filled 2023-04-27: qty 90, 90d supply, fill #0
  Filled 2023-06-26 – 2023-07-26 (×3): qty 90, 90d supply, fill #1
  Filled 2023-10-12: qty 90, 90d supply, fill #0
  Filled 2023-10-12: qty 90, 90d supply, fill #2
  Filled 2024-01-26: qty 90, 90d supply, fill #1

## 2023-05-07 NOTE — Progress Notes (Signed)
 Complete Physical  Assessment and Plan:  Encounter for general adult medical examination with abnormal findings Due yearly  Tachycardia Continue to cut down on caffeine, ETOH, smoking Continue toprol; symptoms have improved/resolved Followed by cardiology; titrating BB with them - denies symptoms of bradycardia which is noted on EKG  Gastroesophageal reflux disease with esophagitis Symptoms well managed without breakthrough Was not controlled when try to taper Omeprazole -Magnesium  Hypothyroidism Hypothyroidism-check TSH level, continue medications the same, reminded to take on an empty stomach 30-33mins before food.  -     TSH  BMI 23 Has lost 62 pounds with Wegovy Continue diet and exercise and Wegovy 0.5 mg SQ QW  OAB Avoid triggers, continue oxybutynin  Anemia, unspecified type - monitor, continue iron supp with Vitamin C and increase green leafy veggies -     CBC with Differential/Platelet  Vitamin D deficiency Continue Vit D supplementation to maintain value in therapeutic level of 60-100  -     VITAMIN D 25 Hydroxy (Vit-D Deficiency, Fractures)  Generalized anxiety disorder/insomnia continue medications, stress management techniques discussed, increase water, good sleep hygiene discussed, increase exercise, and increase veggies.   Abnormal Glucose Continue diet and exercise      -      A1C  Hyperlipidemia/Statin intolerance Continue Zetia, diet and exercise -     Lipid panel  Tobacco use disorder Smoking 1/2 ppd Counseled on dangers of smoking and strongly encouraged to quit. Currently is not ready to quit  Screening for blood or protein in urine -     Urinalysis, Routine w reflex microscopic -     Microalbumin / creatinine urine ratio  Screening for ischemic heart disease - EKG  Screening for AAA - U/S Abd Retroperitoneal LTD  Discussed med's effects and SE's. Screening labs and tests as requested with regular follow-up as recommended. Over 40  minutes of exam, counseling, chart review, and critical decision making was performed this visit.   Future Appointments  Date Time Provider Department Center  05/11/2024 10:00 AM Raynelle Dick, NP [facility] None     HPI  52 y.o. female presents for a complete physical. She has Thyroid disease; Vitamin D deficiency; OAB (overactive bladder); Generalized anxiety disorder; GERD (gastroesophageal reflux disease); Insomnia; Hyperlipidemia; Obesity (BMI 30.0-34.9); Tachycardia; Smoker; DDD (degenerative disc disease), lumbar; Lumbar back pain with radiculopathy affecting right lower extremity; Somatic dysfunction of spine, lumbar; and Partial tear of common extensor tendon of right elbow on their problem list.   She got married in October 2020, husband Thayer Ohm is here, nurse at cardiology office. She has 3 year old son.   She is following with her OB/GYN, Dr. Ellyn Hack, for OAB on oxybutynin; pt reports she saw [facility] several years ago who r/o previously suspected interstitial cystitis and she reports has done well since with this medication.   She has had ongoing tachycardia/palpitations and was having dyspnea associated with this and was worked up by cardiology; Holter showed some PVCs/PACs, ECHO from 10/2017 was unremarkable. She is on toprol 50 mg with improvement in symptoms.   She is on lexapro 20 mg QD and has been doing well. Xanax PRN for anxiety; she currently uses 0.25 mg at night if needed to sleep.   She is smoking 1/2 ppd - not currently ready to quit.   GERD symptoms are currently well controlled with Omeprazole 20 mg QD and dietary modifications  BMI is Body mass index is 23.02 kg/m., she has been working on diet and exercise. She  aims to get 150 min of walking/running weekly. She is currently on Wegovy 0.5 mg SQ QW. Heaviest was 221- has lost 62 pounds Wt Readings from Last 3 Encounters:  05/08/23 151 lb 6.4 oz (68.7 kg)  01/09/23 151 lb 12.8 oz (68.9 kg)  10/07/22  161 lb 3.2 oz (73.1 kg)   Her blood pressure has been controlled at home with metoprolol 50 mg every day for PVC's and are well controlled , today their BP is BP: 108/74  BP Readings from Last 3 Encounters:  05/08/23 108/74  01/09/23 110/68  10/07/22 112/74  She does workout. She denies chest pain, shortness of breath, dizziness.   She is on cholesterol medication, she was on crestor but was having myalgias so stopped, currently on Zetia 10 mg QD. Her cholesterol is not at goal. The cholesterol last visit was:   Lab Results  Component Value Date   CHOL 178 01/09/2023   HDL 79 01/09/2023   LDLCALC 83 01/09/2023   TRIG 77 01/09/2023   CHOLHDL 2.3 01/09/2023   Last Z6X in the office was:   Lab Results  Component Value Date   HGBA1C 5.3 03/31/2022   Patient is on Vitamin D supplement, 5000 IU daily  Lab Results  Component Value Date   VD25OH 67 03/31/2022   She is on thyroid medication. Her medication was not changed last visit. taking 100 mcg daily on empty stomach. Not on a biotin.  Lab Results  Component Value Date   TSH 0.13 (L) 01/09/2023   She is taking oxybutnin 10 mg  24 hour tablet daily for overactive bladder symptoms. Does not get dry mouth too bad.    Current Medications:   Current Outpatient Medications (Endocrine & Metabolic):    levothyroxine (SYNTHROID) 100 MCG tablet, Take 1 tablet (100 mcg total) by mouth daily before breakfast.  Current Outpatient Medications (Cardiovascular):    ezetimibe (ZETIA) 10 MG tablet, Take 1 tablet (10 mg total) by mouth daily.   metoprolol succinate (TOPROL-XL) 50 MG 24 hr tablet, Take 1 tablet (50 mg total) by mouth daily. Take with or immediately following a meal.  Current Outpatient Medications (Respiratory):    budesonide-formoterol (SYMBICORT) 80-4.5 MCG/ACT inhaler, Inhale 2 puffs daily twice a day, wash your mouth afterwards   fluticasone (FLONASE) 50 MCG/ACT nasal spray, Place 2 sprays into both nostrils  daily.    Current Outpatient Medications (Other):    ALPRAZolam (XANAX) 0.5 MG tablet, Take 1/2-1 tablet (0.25-0.5mg ) by mouth 3 (three) times daily as needed for severe anxiety or panic. AVOID TAKING DAILY DUE TO RISK OF TOLERANCE AND ADDICTION . Must last 30 days   Cholecalciferol (VITAMIN D PO), Take 5,000 Units by mouth daily.   cyclobenzaprine (FLEXERIL) 10 MG tablet, Take 1 tablet (10 mg total) by mouth every 8 (eight) hours as needed for muscle spasms.   escitalopram (LEXAPRO) 20 MG tablet, Take 1 tablet (20 mg total) by mouth daily for mood   omeprazole (PRILOSEC) 20 MG capsule, Take 1 capsule (20 mg total) by mouth daily as needed.   oxybutynin (DITROPAN-XL) 10 MG 24 hr tablet, Take 1 tablet (10 mg total) by mouth daily for bladder control   Semaglutide-Weight Management (WEGOVY) 0.5 MG/0.5ML SOAJ, Inject 0.5 mg into the skin once a week.  Health Maintenance:    Immunization History  Administered Date(s) Administered   Influenza, Seasonal, Injecte, Preservative Fre 02/26/2016   Influenza-Unspecified 03/30/2018, 02/17/2022   PFIZER(Purple Top)SARS-COV-2 Vaccination 05/09/2019, 05/27/2019   Tdap 08/15/2014  Health Maintenance  Topic Date Due   Hepatitis C Screening  Never done   Zoster Vaccines- Shingrix (1 of 2) Never done   COVID-19 Vaccine (3 - Pfizer risk series) 06/24/2019   INFLUENZA VACCINE  12/18/2022   MAMMOGRAM  03/25/2024   DTaP/Tdap/Td (2 - Td or Tdap) 08/14/2024   Cervical Cancer Screening (HPV/Pap Cotest)  08/13/2027   Colonoscopy  03/26/2031   HIV Screening  Completed   HPV VACCINES  Aged Out      Patient Care Team: Lucky Cowboy, MD as PCP - General (Internal Medicine) Lewayne Bunting, MD as PCP - Cardiology (Cardiology)  Medical History:  Past Medical History:  Diagnosis Date   Anxiety    Breast hypertrophy 03/2014   GERD (gastroesophageal reflux disease)    Hyperlipidemia    Hypothyroidism    Interstitial cystitis     Allergies Allergies  Allergen Reactions   Amoxicillin Rash and Other (See Comments)    FEVER   Bisoprolol Anxiety   Metoprolol Tartrate Palpitations   Verapamil Rash    SURGICAL HISTORY She  has a past surgical history that includes Breast reduction surgery (Bilateral, 04/18/2014) and Breast biopsy (Left). FAMILY HISTORY Her family history includes CVA (age of onset: 63) in her maternal grandfather; Diabetes in her father; Heart failure in her paternal grandfather; Osteosarcoma (age of onset: 53) in her sister. SOCIAL HISTORY She  reports that she has been smoking cigarettes. She has a 15 pack-year smoking history. She has never used smokeless tobacco. She reports current alcohol use of about 10.0 standard drinks of alcohol per week. She reports that she does not use drugs.  Review of Systems: Review of Systems  Constitutional: Negative.  Negative for malaise/fatigue and weight loss.  HENT: Negative.  Negative for hearing loss and tinnitus.   Eyes: Negative.  Negative for blurred vision and double vision.  Respiratory: Negative.  Negative for cough, sputum production, shortness of breath and wheezing.   Cardiovascular: Negative.  Negative for chest pain, palpitations, orthopnea, claudication, leg swelling and PND.  Gastrointestinal:  Negative for abdominal pain, blood in stool, constipation, diarrhea, heartburn, melena, nausea and vomiting.  Genitourinary:  Negative for dysuria, flank pain, frequency, hematuria and urgency.  Musculoskeletal: Negative.  Negative for falls, joint pain and myalgias.  Skin: Negative.  Negative for rash.  Neurological: Negative.  Negative for dizziness, tingling, sensory change, weakness and headaches.  Endo/Heme/Allergies:  Negative for polydipsia.  Psychiatric/Behavioral:  Negative for depression, memory loss, substance abuse and suicidal ideas. The patient is not nervous/anxious and does not have insomnia.   All other systems reviewed and are  negative.   Physical Exam: Estimated body mass index is 23.02 kg/m as calculated from the following:   Height as of this encounter: 5\' 8"  (1.727 m).   Weight as of this encounter: 151 lb 6.4 oz (68.7 kg). BP 108/74   Pulse 75   Temp 97.7 F (36.5 C)   Ht 5\' 8"  (1.727 m)   Wt 151 lb 6.4 oz (68.7 kg)   SpO2 99%   BMI 23.02 kg/m   Wt Readings from Last 3 Encounters:  05/08/23 151 lb 6.4 oz (68.7 kg)  01/09/23 151 lb 12.8 oz (68.9 kg)  10/07/22 161 lb 3.2 oz (73.1 kg)   General Appearance: Well nourished, in no apparent distress.  Eyes: PERRLA, EOMs, conjunctiva no swelling or erythema, normal fundi and vessels.  Sinuses: No Frontal/maxillary tenderness  ENT/Mouth: Ext aud canals clear, normal light reflex with TMs without erythema,  bulging. Good dentition. No erythema, swelling, or exudate on post pharynx.  Hearing normal.  Neck: Supple, thyroid normal. No bruits  Respiratory: Respiratory effort normal, BS equal bilaterally without rales, rhonchi, wheezing or stridor.  Cardio: RRR without murmurs, rubs or gallops. Brisk peripheral pulses without edema.  Chest: symmetric, with normal excursions and percussion.  Breasts: defer to GYN Abdomen: Soft, nontender, no guarding, rebound, hernias, masses, or organomegaly.  Lymphatics: Non tender without lymphadenopathy.  Genitourinary: defer to GYN Musculoskeletal: Full ROM all peripheral extremities,5/5 strength, and normal gait.  Skin: Warm, dry without rashes, lesions, ecchymosis. Neuro: Cranial nerves intact, reflexes equal bilaterally. Normal muscle tone, no cerebellar symptoms. Sensation intact.  Psych: Awake and oriented X 3, normal affect, Insight and Judgment appropriate.   EKG: NSR, no ST changes AAA; < 3 cm  Clarabel Marion E  11:44 AM [facility]

## 2023-05-08 ENCOUNTER — Encounter: Payer: Self-pay | Admitting: Nurse Practitioner

## 2023-05-08 ENCOUNTER — Ambulatory Visit (INDEPENDENT_AMBULATORY_CARE_PROVIDER_SITE_OTHER): Payer: Federal, State, Local not specified - PPO | Admitting: Nurse Practitioner

## 2023-05-08 VITALS — BP 108/74 | HR 75 | Temp 97.7°F | Ht 68.0 in | Wt 151.4 lb

## 2023-05-08 DIAGNOSIS — E782 Mixed hyperlipidemia: Secondary | ICD-10-CM

## 2023-05-08 DIAGNOSIS — E559 Vitamin D deficiency, unspecified: Secondary | ICD-10-CM | POA: Diagnosis not present

## 2023-05-08 DIAGNOSIS — K2100 Gastro-esophageal reflux disease with esophagitis, without bleeding: Secondary | ICD-10-CM

## 2023-05-08 DIAGNOSIS — E039 Hypothyroidism, unspecified: Secondary | ICD-10-CM

## 2023-05-08 DIAGNOSIS — Z131 Encounter for screening for diabetes mellitus: Secondary | ICD-10-CM | POA: Diagnosis not present

## 2023-05-08 DIAGNOSIS — I700 Atherosclerosis of aorta: Secondary | ICD-10-CM

## 2023-05-08 DIAGNOSIS — R7309 Other abnormal glucose: Secondary | ICD-10-CM

## 2023-05-08 DIAGNOSIS — R000 Tachycardia, unspecified: Secondary | ICD-10-CM | POA: Diagnosis not present

## 2023-05-08 DIAGNOSIS — Z0001 Encounter for general adult medical examination with abnormal findings: Secondary | ICD-10-CM

## 2023-05-08 DIAGNOSIS — Z6824 Body mass index (BMI) 24.0-24.9, adult: Secondary | ICD-10-CM

## 2023-05-08 DIAGNOSIS — Z13220 Encounter for screening for lipoid disorders: Secondary | ICD-10-CM

## 2023-05-08 DIAGNOSIS — Z79899 Other long term (current) drug therapy: Secondary | ICD-10-CM

## 2023-05-08 DIAGNOSIS — F17200 Nicotine dependence, unspecified, uncomplicated: Secondary | ICD-10-CM

## 2023-05-08 DIAGNOSIS — Z136 Encounter for screening for cardiovascular disorders: Secondary | ICD-10-CM

## 2023-05-08 DIAGNOSIS — Z789 Other specified health status: Secondary | ICD-10-CM

## 2023-05-08 DIAGNOSIS — Z0000 Encounter for general adult medical examination without abnormal findings: Secondary | ICD-10-CM | POA: Diagnosis not present

## 2023-05-08 DIAGNOSIS — Z1389 Encounter for screening for other disorder: Secondary | ICD-10-CM | POA: Diagnosis not present

## 2023-05-08 DIAGNOSIS — F411 Generalized anxiety disorder: Secondary | ICD-10-CM

## 2023-05-08 DIAGNOSIS — G4700 Insomnia, unspecified: Secondary | ICD-10-CM

## 2023-05-08 DIAGNOSIS — D649 Anemia, unspecified: Secondary | ICD-10-CM

## 2023-05-08 DIAGNOSIS — N3281 Overactive bladder: Secondary | ICD-10-CM

## 2023-05-08 NOTE — Patient Instructions (Signed)

## 2023-05-09 LAB — COMPLETE METABOLIC PANEL WITH GFR
AG Ratio: 1.4 (calc) (ref 1.0–2.5)
ALT: 17 U/L (ref 6–29)
AST: 22 U/L (ref 10–35)
Albumin: 4.7 g/dL (ref 3.6–5.1)
Alkaline phosphatase (APISO): 55 U/L (ref 37–153)
BUN: 12 mg/dL (ref 7–25)
CO2: 29 mmol/L (ref 20–32)
Calcium: 9.8 mg/dL (ref 8.6–10.4)
Chloride: 104 mmol/L (ref 98–110)
Creat: 0.96 mg/dL (ref 0.50–1.03)
Globulin: 3.4 g/dL (ref 1.9–3.7)
Glucose, Bld: 89 mg/dL (ref 65–99)
Potassium: 4.3 mmol/L (ref 3.5–5.3)
Sodium: 143 mmol/L (ref 135–146)
Total Bilirubin: 0.5 mg/dL (ref 0.2–1.2)
Total Protein: 8.1 g/dL (ref 6.1–8.1)
eGFR: 72 mL/min/{1.73_m2} (ref >=60)

## 2023-05-09 LAB — MICROALBUMIN / CREATININE URINE RATIO
Creatinine, Urine: 51 mg/dL (ref 20–275)
Microalb, Ur: <0.2 mg/dL

## 2023-05-09 LAB — MAGNESIUM: Magnesium: 2.2 mg/dL (ref 1.5–2.5)

## 2023-05-09 LAB — CBC WITH DIFFERENTIAL/PLATELET
Absolute Lymphocytes: 2181 {cells}/uL (ref 850–3900)
Absolute Monocytes: 590 {cells}/uL (ref 200–950)
Basophils Absolute: 90 {cells}/uL (ref 0–200)
Basophils Relative: 1.1 %
Eosinophils Absolute: 213 {cells}/uL (ref 15–500)
Eosinophils Relative: 2.6 %
HCT: 44.9 % (ref 35.0–45.0)
Hemoglobin: 15 g/dL (ref 11.7–15.5)
MCH: 30.7 pg (ref 27.0–33.0)
MCHC: 33.4 g/dL (ref 32.0–36.0)
MCV: 92 fL (ref 80.0–100.0)
MPV: 11.4 fL (ref 7.5–12.5)
Monocytes Relative: 7.2 %
Neutro Abs: 5125 {cells}/uL (ref 1500–7800)
Neutrophils Relative %: 62.5 %
Platelets: 311 10*3/uL (ref 140–400)
RBC: 4.88 10*6/uL (ref 3.80–5.10)
RDW: 11.8 % (ref 11.0–15.0)
Total Lymphocyte: 26.6 %
WBC: 8.2 10*3/uL (ref 3.8–10.8)

## 2023-05-09 LAB — URINALYSIS, ROUTINE W REFLEX MICROSCOPIC
Bilirubin Urine: NEGATIVE
Glucose, UA: NEGATIVE
Hgb urine dipstick: NEGATIVE
Ketones, ur: NEGATIVE
Leukocytes,Ua: NEGATIVE
Nitrite: NEGATIVE
Protein, ur: NEGATIVE
Specific Gravity, Urine: 1.008 (ref 1.001–1.035)
pH: 6 (ref 5.0–8.0)

## 2023-05-09 LAB — LIPID PANEL
Cholesterol: 192 mg/dL (ref <200)
HDL: 99 mg/dL (ref >=50)
LDL Cholesterol (Calc): 74 mg/dL
Non-HDL Cholesterol (Calc): 93 mg/dL (ref <130)
Total CHOL/HDL Ratio: 1.9 (calc) (ref <5.0)
Triglycerides: 102 mg/dL (ref <150)

## 2023-05-09 LAB — HEMOGLOBIN A1C W/OUT EAG: Hgb A1c MFr Bld: 5.3 %{Hb} (ref <5.7)

## 2023-05-09 LAB — TSH: TSH: 1.33 m[IU]/L

## 2023-05-09 LAB — VITAMIN D 25 HYDROXY (VIT D DEFICIENCY, FRACTURES): Vit D, 25-Hydroxy: 71 ng/mL (ref 30–100)

## 2023-05-21 ENCOUNTER — Other Ambulatory Visit (HOSPITAL_BASED_OUTPATIENT_CLINIC_OR_DEPARTMENT_OTHER): Payer: Self-pay

## 2023-05-21 ENCOUNTER — Other Ambulatory Visit: Payer: Self-pay

## 2023-05-22 ENCOUNTER — Other Ambulatory Visit: Payer: Self-pay

## 2023-05-25 ENCOUNTER — Other Ambulatory Visit (HOSPITAL_COMMUNITY): Payer: Self-pay

## 2023-05-25 ENCOUNTER — Other Ambulatory Visit: Payer: Self-pay | Admitting: Nurse Practitioner

## 2023-05-25 ENCOUNTER — Encounter: Payer: Self-pay | Admitting: Nurse Practitioner

## 2023-05-25 DIAGNOSIS — E663 Overweight: Secondary | ICD-10-CM

## 2023-05-25 MED ORDER — PHENTERMINE HCL 37.5 MG PO TABS
18.7500 mg | ORAL_TABLET | Freq: Every morning | ORAL | 0 refills | Status: DC
  Filled 2023-05-25 – 2023-05-29 (×2): qty 30, 30d supply, fill #0

## 2023-05-25 MED ORDER — TOPIRAMATE 50 MG PO TABS
50.0000 mg | ORAL_TABLET | Freq: Two times a day (BID) | ORAL | 0 refills | Status: DC
  Filled 2023-05-25: qty 60, 30d supply, fill #0

## 2023-05-28 ENCOUNTER — Encounter (HOSPITAL_COMMUNITY): Payer: Self-pay

## 2023-05-29 ENCOUNTER — Other Ambulatory Visit (HOSPITAL_COMMUNITY): Payer: Self-pay

## 2023-06-05 ENCOUNTER — Encounter: Admit: 2023-06-05 | Payer: Managed Care (Private) | Attending: Internal Medicine | Primary: Internal Medicine

## 2023-06-05 ENCOUNTER — Encounter: Admit: 2023-06-05 | Payer: PRIVATE HEALTH INSURANCE | Attending: Internal Medicine | Primary: Internal Medicine

## 2023-06-05 VITALS — BP 130/86 | HR 88 | Temp 98.20000°F | Ht 61.5 in | Wt 157.8 lb

## 2023-06-05 DIAGNOSIS — I10 Essential (primary) hypertension: Secondary | ICD-10-CM

## 2023-06-05 DIAGNOSIS — Z0000 Encounter for general adult medical examination without abnormal findings: Secondary | ICD-10-CM

## 2023-06-05 DIAGNOSIS — L659 Nonscarring hair loss, unspecified: Secondary | ICD-10-CM

## 2023-06-05 DIAGNOSIS — R748 Abnormal levels of other serum enzymes: Secondary | ICD-10-CM

## 2023-06-05 DIAGNOSIS — R7401 Elevated alanine aminotransferase (ALT) level: Secondary | ICD-10-CM

## 2023-06-05 DIAGNOSIS — E559 Vitamin D deficiency, unspecified: Secondary | ICD-10-CM

## 2023-06-05 DIAGNOSIS — E7849 Other hyperlipidemia: Secondary | ICD-10-CM

## 2023-06-05 DIAGNOSIS — F419 Anxiety disorder, unspecified: Secondary | ICD-10-CM

## 2023-06-05 DIAGNOSIS — E611 Iron deficiency: Secondary | ICD-10-CM

## 2023-06-05 DIAGNOSIS — Z889 Allergy status to unspecified drugs, medicaments and biological substances status: Secondary | ICD-10-CM

## 2023-06-05 DIAGNOSIS — J45909 Unspecified asthma, uncomplicated: Secondary | ICD-10-CM

## 2023-06-05 DIAGNOSIS — E785 Hyperlipidemia, unspecified: Secondary | ICD-10-CM

## 2023-06-05 MED ORDER — ASCORBIC ACID (VITAMIN C) 500 MG TABLET
500 | ORAL | 0.00 refills | 50.00000 days | Status: AC
Start: 2023-06-05 — End: ?

## 2023-06-05 MED ORDER — SLOW RELEASE IRON 140 MG (45 MG IRON) TABLET,EXTENDED RELEASE
140 | ORAL | 2.00 refills | 90.00000 days | Status: AC
Start: 2023-06-05 — End: ?

## 2023-06-07 ENCOUNTER — Encounter: Admit: 2023-06-07 | Payer: PRIVATE HEALTH INSURANCE | Attending: Internal Medicine | Primary: Internal Medicine

## 2023-06-07 DIAGNOSIS — E7849 Other hyperlipidemia: Secondary | ICD-10-CM

## 2023-06-07 MED ORDER — ROSUVASTATIN 5 MG TABLET
5 | ORAL_TABLET | Freq: Every day | ORAL | 4 refills | 90.00000 days | Status: AC
Start: 2023-06-07 — End: ?

## 2023-06-09 ENCOUNTER — Other Ambulatory Visit: Payer: Self-pay

## 2023-06-09 ENCOUNTER — Other Ambulatory Visit (HOSPITAL_COMMUNITY): Payer: Self-pay

## 2023-06-09 ENCOUNTER — Other Ambulatory Visit: Payer: Self-pay | Admitting: Nurse Practitioner

## 2023-06-09 DIAGNOSIS — K2100 Gastro-esophageal reflux disease with esophagitis, without bleeding: Secondary | ICD-10-CM

## 2023-06-09 MED ORDER — OMEPRAZOLE 20 MG PO CPDR
20.0000 mg | DELAYED_RELEASE_CAPSULE | Freq: Every day | ORAL | 1 refills | Status: AC | PRN
  Filled 2023-06-09: qty 90, 90d supply, fill #0
  Filled 2023-06-26 – 2023-11-29 (×2): qty 90, 90d supply, fill #1

## 2023-06-11 ENCOUNTER — Encounter: Admit: 2023-06-11 | Payer: PRIVATE HEALTH INSURANCE | Attending: Internal Medicine | Primary: Internal Medicine

## 2023-06-26 ENCOUNTER — Other Ambulatory Visit (HOSPITAL_COMMUNITY): Payer: Self-pay

## 2023-06-26 ENCOUNTER — Other Ambulatory Visit: Payer: Self-pay

## 2023-07-03 ENCOUNTER — Other Ambulatory Visit: Payer: Self-pay | Admitting: Family

## 2023-07-03 ENCOUNTER — Other Ambulatory Visit (HOSPITAL_COMMUNITY): Payer: Self-pay

## 2023-07-03 ENCOUNTER — Other Ambulatory Visit: Payer: Self-pay

## 2023-07-03 DIAGNOSIS — E663 Overweight: Secondary | ICD-10-CM

## 2023-07-03 MED ORDER — PHENTERMINE HCL 37.5 MG PO TABS
18.7500 mg | ORAL_TABLET | Freq: Every morning | ORAL | 0 refills | Status: DC
  Filled 2023-07-03: qty 30, 30d supply, fill #0

## 2023-07-07 ENCOUNTER — Other Ambulatory Visit (HOSPITAL_COMMUNITY): Payer: Self-pay

## 2023-07-07 DIAGNOSIS — K219 Gastro-esophageal reflux disease without esophagitis: Secondary | ICD-10-CM | POA: Diagnosis not present

## 2023-07-07 DIAGNOSIS — F411 Generalized anxiety disorder: Secondary | ICD-10-CM | POA: Diagnosis not present

## 2023-07-07 DIAGNOSIS — E782 Mixed hyperlipidemia: Secondary | ICD-10-CM | POA: Diagnosis not present

## 2023-07-07 DIAGNOSIS — E039 Hypothyroidism, unspecified: Secondary | ICD-10-CM | POA: Diagnosis not present

## 2023-07-07 MED ORDER — OMEPRAZOLE 20 MG PO CPDR
20.0000 mg | DELAYED_RELEASE_CAPSULE | Freq: Every day | ORAL | 1 refills | Status: DC
  Filled 2023-07-07: qty 90, 90d supply, fill #0

## 2023-07-07 MED ORDER — METOPROLOL SUCCINATE ER 50 MG PO TB24
50.0000 mg | ORAL_TABLET | Freq: Every day | ORAL | 1 refills | Status: AC
  Filled 2023-07-07 – 2023-10-12 (×3): qty 90, 90d supply, fill #0

## 2023-07-07 MED ORDER — FAMOTIDINE 40 MG PO TABS
40.0000 mg | ORAL_TABLET | Freq: Every day | ORAL | 3 refills | Status: AC
  Filled 2023-07-07: qty 90, 90d supply, fill #0

## 2023-07-07 MED ORDER — ESCITALOPRAM OXALATE 20 MG PO TABS
20.0000 mg | ORAL_TABLET | Freq: Every day | ORAL | 3 refills | Status: DC
  Filled 2023-07-07: qty 90, 90d supply, fill #0
  Filled 2023-11-11: qty 90, 90d supply, fill #1

## 2023-07-07 MED ORDER — LEVOTHYROXINE SODIUM 100 MCG PO TABS
100.0000 ug | ORAL_TABLET | Freq: Every day | ORAL | 1 refills | Status: DC
  Filled 2023-07-07 – 2023-08-02 (×2): qty 90, 90d supply, fill #0
  Filled 2023-11-11: qty 90, 90d supply, fill #1

## 2023-07-08 ENCOUNTER — Other Ambulatory Visit (HOSPITAL_COMMUNITY): Payer: Self-pay

## 2023-07-08 ENCOUNTER — Encounter (HOSPITAL_COMMUNITY): Payer: Self-pay

## 2023-07-08 MED ORDER — ALPRAZOLAM 0.5 MG PO TABS
0.2500 mg | ORAL_TABLET | ORAL | 0 refills | Status: AC | PRN
  Filled 2023-07-08: qty 30, 30d supply, fill #0

## 2023-07-17 ENCOUNTER — Other Ambulatory Visit (HOSPITAL_COMMUNITY): Payer: Self-pay

## 2023-08-03 ENCOUNTER — Other Ambulatory Visit: Payer: Self-pay

## 2023-08-03 ENCOUNTER — Other Ambulatory Visit (HOSPITAL_COMMUNITY): Payer: Self-pay

## 2023-09-06 ENCOUNTER — Encounter: Admit: 2023-09-06 | Payer: PRIVATE HEALTH INSURANCE | Attending: Internal Medicine | Primary: Internal Medicine

## 2023-09-06 DIAGNOSIS — F419 Anxiety disorder, unspecified: Secondary | ICD-10-CM

## 2023-09-06 DIAGNOSIS — I10 Essential (primary) hypertension: Secondary | ICD-10-CM

## 2023-09-07 MED ORDER — METOPROLOL SUCCINATE ER 25 MG TABLET,EXTENDED RELEASE 24 HR
25 | ORAL_TABLET | ORAL | 2 refills | 90.00000 days | Status: AC
Start: 2023-09-07 — End: ?

## 2023-09-07 MED ORDER — SERTRALINE 50 MG TABLET
50 | ORAL_TABLET | ORAL | 4 refills | 90.00000 days | Status: AC
Start: 2023-09-07 — End: ?

## 2023-09-11 DIAGNOSIS — R5383 Other fatigue: Secondary | ICD-10-CM | POA: Diagnosis not present

## 2023-09-11 DIAGNOSIS — E559 Vitamin D deficiency, unspecified: Secondary | ICD-10-CM | POA: Diagnosis not present

## 2023-09-18 DIAGNOSIS — K219 Gastro-esophageal reflux disease without esophagitis: Secondary | ICD-10-CM | POA: Diagnosis not present

## 2023-09-18 DIAGNOSIS — E559 Vitamin D deficiency, unspecified: Secondary | ICD-10-CM | POA: Diagnosis not present

## 2023-09-18 DIAGNOSIS — E782 Mixed hyperlipidemia: Secondary | ICD-10-CM | POA: Diagnosis not present

## 2023-09-18 DIAGNOSIS — E039 Hypothyroidism, unspecified: Secondary | ICD-10-CM | POA: Diagnosis not present

## 2023-09-22 ENCOUNTER — Other Ambulatory Visit (HOSPITAL_COMMUNITY): Payer: Self-pay

## 2023-09-24 ENCOUNTER — Encounter (HOSPITAL_COMMUNITY): Payer: Self-pay

## 2023-09-24 ENCOUNTER — Other Ambulatory Visit (HOSPITAL_COMMUNITY): Payer: Self-pay

## 2023-09-30 ENCOUNTER — Other Ambulatory Visit (HOSPITAL_COMMUNITY): Payer: Self-pay

## 2023-09-30 DIAGNOSIS — G4700 Insomnia, unspecified: Secondary | ICD-10-CM | POA: Diagnosis not present

## 2023-09-30 DIAGNOSIS — F410 Panic disorder [episodic paroxysmal anxiety] without agoraphobia: Secondary | ICD-10-CM | POA: Diagnosis not present

## 2023-09-30 DIAGNOSIS — F411 Generalized anxiety disorder: Secondary | ICD-10-CM | POA: Diagnosis not present

## 2023-09-30 MED ORDER — ALPRAZOLAM 0.5 MG PO TABS
0.5000 mg | ORAL_TABLET | Freq: Two times a day (BID) | ORAL | 3 refills | Status: DC
  Filled 2023-09-30: qty 45, 23d supply, fill #0
  Filled 2023-11-29: qty 45, 23d supply, fill #1
  Filled 2024-02-10: qty 45, 23d supply, fill #2

## 2023-10-12 ENCOUNTER — Other Ambulatory Visit (HOSPITAL_COMMUNITY): Payer: Self-pay

## 2023-10-13 ENCOUNTER — Other Ambulatory Visit (HOSPITAL_COMMUNITY): Payer: Self-pay

## 2023-11-10 ENCOUNTER — Ambulatory Visit: Payer: Federal, State, Local not specified - PPO | Admitting: Nurse Practitioner

## 2023-11-11 ENCOUNTER — Other Ambulatory Visit: Payer: Self-pay | Admitting: Cardiology

## 2023-11-11 ENCOUNTER — Other Ambulatory Visit: Payer: Self-pay

## 2023-11-11 DIAGNOSIS — E7800 Pure hypercholesterolemia, unspecified: Secondary | ICD-10-CM

## 2023-11-18 ENCOUNTER — Other Ambulatory Visit (HOSPITAL_COMMUNITY): Payer: Self-pay

## 2023-11-25 ENCOUNTER — Other Ambulatory Visit (HOSPITAL_COMMUNITY): Payer: Self-pay

## 2023-11-25 DIAGNOSIS — Z79899 Other long term (current) drug therapy: Secondary | ICD-10-CM | POA: Diagnosis not present

## 2023-11-25 DIAGNOSIS — K219 Gastro-esophageal reflux disease without esophagitis: Secondary | ICD-10-CM | POA: Diagnosis not present

## 2023-11-25 DIAGNOSIS — E782 Mixed hyperlipidemia: Secondary | ICD-10-CM | POA: Diagnosis not present

## 2023-11-25 DIAGNOSIS — Z789 Other specified health status: Secondary | ICD-10-CM | POA: Diagnosis not present

## 2023-11-25 MED ORDER — WEGOVY 0.25 MG/0.5ML ~~LOC~~ SOAJ
0.2500 mg | SUBCUTANEOUS | 1 refills | Status: DC
  Filled 2023-11-25: qty 2, 28d supply, fill #0

## 2023-11-25 MED ORDER — EZETIMIBE 10 MG PO TABS
10.0000 mg | ORAL_TABLET | Freq: Every day | ORAL | 3 refills | Status: DC
  Filled 2023-11-25: qty 90, 90d supply, fill #0
  Filled 2024-03-10: qty 90, 90d supply, fill #1

## 2023-11-25 MED ORDER — METOPROLOL SUCCINATE ER 50 MG PO TB24
50.0000 mg | ORAL_TABLET | Freq: Every day | ORAL | 3 refills | Status: DC
  Filled 2023-11-25 – 2024-01-08 (×2): qty 90, 90d supply, fill #0
  Filled 2024-04-09: qty 90, 90d supply, fill #1

## 2023-11-25 MED ORDER — FAMOTIDINE 40 MG PO TABS
40.0000 mg | ORAL_TABLET | Freq: Every day | ORAL | 3 refills | Status: DC
  Filled 2023-11-25: qty 90, 90d supply, fill #0
  Filled 2024-03-10: qty 90, 90d supply, fill #1

## 2023-11-29 ENCOUNTER — Other Ambulatory Visit (HOSPITAL_COMMUNITY): Payer: Self-pay

## 2023-11-30 ENCOUNTER — Other Ambulatory Visit: Payer: Self-pay

## 2023-11-30 ENCOUNTER — Other Ambulatory Visit (HOSPITAL_COMMUNITY): Payer: Self-pay

## 2023-11-30 MED ORDER — SEMAGLUTIDE-WEIGHT MANAGEMENT 0.25 MG/0.5ML ~~LOC~~ SOAJ
0.2500 mg | SUBCUTANEOUS | 1 refills | Status: DC
  Filled 2023-11-30: qty 2, 28d supply, fill #0

## 2023-12-04 ENCOUNTER — Encounter: Admit: 2023-12-04 | Payer: PRIVATE HEALTH INSURANCE | Attending: Internal Medicine | Primary: Internal Medicine

## 2023-12-10 ENCOUNTER — Other Ambulatory Visit (HOSPITAL_COMMUNITY): Payer: Self-pay

## 2024-01-08 ENCOUNTER — Other Ambulatory Visit (HOSPITAL_COMMUNITY): Payer: Self-pay

## 2024-01-15 ENCOUNTER — Other Ambulatory Visit (HOSPITAL_COMMUNITY): Payer: Self-pay

## 2024-01-15 DIAGNOSIS — G4700 Insomnia, unspecified: Secondary | ICD-10-CM | POA: Diagnosis not present

## 2024-01-15 DIAGNOSIS — F411 Generalized anxiety disorder: Secondary | ICD-10-CM | POA: Diagnosis not present

## 2024-01-15 DIAGNOSIS — F331 Major depressive disorder, recurrent, moderate: Secondary | ICD-10-CM | POA: Diagnosis not present

## 2024-01-15 DIAGNOSIS — N951 Menopausal and female climacteric states: Secondary | ICD-10-CM | POA: Diagnosis not present

## 2024-01-15 MED ORDER — BUPROPION HCL ER (XL) 150 MG PO TB24
150.0000 mg | ORAL_TABLET | ORAL | 1 refills | Status: DC
  Filled 2024-01-15: qty 90, 90d supply, fill #0

## 2024-01-21 ENCOUNTER — Ambulatory Visit: Admitting: Family Medicine

## 2024-01-27 ENCOUNTER — Other Ambulatory Visit: Payer: Self-pay

## 2024-01-27 ENCOUNTER — Encounter: Payer: Self-pay | Admitting: Pharmacist

## 2024-01-29 ENCOUNTER — Other Ambulatory Visit: Payer: Self-pay

## 2024-01-29 ENCOUNTER — Other Ambulatory Visit (HOSPITAL_COMMUNITY): Payer: Self-pay

## 2024-02-09 ENCOUNTER — Ambulatory Visit: Admit: 2024-02-09 | Payer: Managed Care (Private) | Attending: Internal Medicine | Primary: Internal Medicine

## 2024-02-09 ENCOUNTER — Encounter: Admit: 2024-02-09 | Payer: PRIVATE HEALTH INSURANCE | Attending: Internal Medicine | Primary: Internal Medicine

## 2024-02-09 VITALS — BP 120/82 | HR 111 | Temp 98.10000°F | Resp 16 | Ht 61.0 in | Wt 155.0 lb

## 2024-02-09 DIAGNOSIS — J069 Acute upper respiratory infection, unspecified: Principal | ICD-10-CM

## 2024-02-09 DIAGNOSIS — J029 Acute pharyngitis, unspecified: Secondary | ICD-10-CM

## 2024-02-09 DIAGNOSIS — I10 Essential (primary) hypertension: Secondary | ICD-10-CM

## 2024-02-09 DIAGNOSIS — E559 Vitamin D deficiency, unspecified: Secondary | ICD-10-CM

## 2024-02-09 DIAGNOSIS — J45909 Unspecified asthma, uncomplicated: Secondary | ICD-10-CM

## 2024-02-09 DIAGNOSIS — R748 Abnormal levels of other serum enzymes: Secondary | ICD-10-CM

## 2024-02-09 DIAGNOSIS — Z889 Allergy status to unspecified drugs, medicaments and biological substances status: Secondary | ICD-10-CM

## 2024-02-09 DIAGNOSIS — E785 Hyperlipidemia, unspecified: Secondary | ICD-10-CM

## 2024-02-09 DIAGNOSIS — F419 Anxiety disorder, unspecified: Principal | ICD-10-CM

## 2024-02-10 ENCOUNTER — Other Ambulatory Visit (HOSPITAL_COMMUNITY): Payer: Self-pay

## 2024-02-10 ENCOUNTER — Other Ambulatory Visit: Payer: Self-pay

## 2024-02-10 DIAGNOSIS — G4700 Insomnia, unspecified: Secondary | ICD-10-CM | POA: Diagnosis not present

## 2024-02-10 DIAGNOSIS — Z7985 Long-term (current) use of injectable non-insulin antidiabetic drugs: Secondary | ICD-10-CM | POA: Diagnosis not present

## 2024-02-10 DIAGNOSIS — F411 Generalized anxiety disorder: Secondary | ICD-10-CM | POA: Diagnosis not present

## 2024-02-10 DIAGNOSIS — F331 Major depressive disorder, recurrent, moderate: Secondary | ICD-10-CM | POA: Diagnosis not present

## 2024-02-10 MED ORDER — VENLAFAXINE HCL ER 37.5 MG PO CP24
37.5000 mg | ORAL_CAPSULE | Freq: Every morning | ORAL | 0 refills | Status: DC
  Filled 2024-02-10: qty 90, 90d supply, fill #0

## 2024-02-10 MED ORDER — ESCITALOPRAM OXALATE 20 MG PO TABS
20.0000 mg | ORAL_TABLET | Freq: Every day | ORAL | 3 refills | Status: AC
  Filled 2024-02-10: qty 90, 90d supply, fill #0
  Filled 2024-06-07 – 2024-06-09 (×2): qty 90, 90d supply, fill #1

## 2024-02-29 ENCOUNTER — Other Ambulatory Visit (HOSPITAL_COMMUNITY): Payer: Self-pay

## 2024-02-29 MED ORDER — FLUZONE 0.5 ML IM SUSY
0.5000 mL | PREFILLED_SYRINGE | Freq: Once | INTRAMUSCULAR | 0 refills | Status: AC
  Filled 2024-02-29: qty 0.5, 1d supply, fill #0

## 2024-03-01 ENCOUNTER — Ambulatory Visit: Admit: 2024-03-01 | Payer: PRIVATE HEALTH INSURANCE | Attending: Obstetrics and Gynecology | Primary: Internal Medicine

## 2024-03-01 ENCOUNTER — Encounter: Admit: 2024-03-01 | Payer: PRIVATE HEALTH INSURANCE | Attending: Obstetrics and Gynecology | Primary: Internal Medicine

## 2024-03-01 VITALS — BP 120/86 | Ht 61.0 in | Wt 162.0 lb

## 2024-03-01 DIAGNOSIS — Z1231 Encounter for screening mammogram for malignant neoplasm of breast: Secondary | ICD-10-CM

## 2024-03-01 DIAGNOSIS — Z01419 Encounter for gynecological examination (general) (routine) without abnormal findings: Principal | ICD-10-CM

## 2024-03-01 DIAGNOSIS — F419 Anxiety disorder, unspecified: Principal | ICD-10-CM

## 2024-03-01 DIAGNOSIS — J45909 Unspecified asthma, uncomplicated: Secondary | ICD-10-CM

## 2024-03-01 DIAGNOSIS — Z889 Allergy status to unspecified drugs, medicaments and biological substances status: Secondary | ICD-10-CM

## 2024-03-01 DIAGNOSIS — E785 Hyperlipidemia, unspecified: Secondary | ICD-10-CM

## 2024-03-01 DIAGNOSIS — I10 Essential (primary) hypertension: Secondary | ICD-10-CM

## 2024-03-01 DIAGNOSIS — R748 Abnormal levels of other serum enzymes: Secondary | ICD-10-CM

## 2024-03-01 DIAGNOSIS — E559 Vitamin D deficiency, unspecified: Secondary | ICD-10-CM

## 2024-03-01 NOTE — Patient Instructions
 Patient Education How to Perform Breast Self-Examination Why is this procedure done? It is important to be aware of how your breasts normally look and feel. This way you can report any changes to your doctor right away. Some women find it helpful to do a breast self-exam to look for lumps or changes in your breast. It is good to do this exam 3 to 5 days after your period ends. This is when your breasts are less swollen or tender. If you do not have periods, try to do the exam on the same day every month. Some people choose the first of the month to help them remember.What will the results be? You will be aware of any changes in your breast. You are feeling for pain or lumps. You are looking to see if there are changes like:SwellingDimplingRedness or rashScaly nipple or breast skinNipple dischargeNipples pushed inward or inverted nipplesWhat happens during the procedure? Standing Exam Stand in front of a mirror so that you can see both of your breasts clearly. Check each breast for anything unusual. Look for a discharge from the nipples.Watch closely in the mirror. Put your hands behind your head and press your elbows forward. Check if there is any change in the shape of your breasts.Press your hands firmly on your hips and bend slightly toward the mirror as you pull your shoulders and elbows forward. Check if there is any change in the shape of your breasts.Gently press each nipple and look for discharge.Raise your right arm. Use the pads of the fingers of your left hand to check the right breast and the area around it. You may use lotion or powder to help your fingers glide easily over the skin. Some people also find it easy to do this in the shower. Feel for any lump or mass under the skin.Feel the tissue by pressing your fingers in small overlapping areas the size of a dime. Be sure to cover your whole breast. Take your time. Follow a definite pattern, such as vertical lines, a continuous circle, or a wedge pattern.Check the upper and outer part of the right breast and the right armpit as well.Repeat the exam on your left breast, using the finger pads of your right hand.Lying Down Exam Lie down with a pillow under your right shoulder and place your right arm behind your head.Use the finger pads of the three middle fingers on your left hand to feel for lumps in your right breast.Press firmly enough to know how your breast feels. A firm ridge in the lower curve of each breast is normal.Move around the breast in a continuous circle, vertical lines, or a wedge pattern. Be sure to do it the same way each month. Check the entire breast.Repeat the exam on your left breast, using the finger pads of your right hand.The whole procedure will take about 10 to 20 minutes.What follow-up care is needed? Call your doctor if you notice any soreness, unusual lumps, or changes in your breast.Where can I learn more? American Cancer Societyhttps://www.cancer.org/cancer/breast-cancer/frequently-asked-questions-about-the-american-cancer-society-new-breast-cancer-screening-guideline.html Breast Cancer.orghttp://www.breastcancer.org/symptoms/testing/types/self_exam/bse_steps Last Reviewed Date 2021-09-15Consumer Information Use and Disclaimer This generalized information is a limited summary of diagnosis, treatment, and/or medication information. It is not meant to be comprehensive and should be used as a tool to help the user understand and/or assess potential diagnostic and treatment options. It does NOT include all information about conditions, treatments, medications, side effects, or risks that may apply to a specific patient. It is not intended to be medical advice or a substitute for  the medical advice, diagnosis, or treatment of a health care provider based on the health care provider's examination and assessment of a patient?s specific and unique circumstances. Patients must speak with a health care provider for complete information about their health, medical questions, and treatment options, including any risks or benefits regarding use of medications. This information does not endorse any treatments or medications as safe, effective, or approved for treating a specific patient. [employer] and its affiliates disclaim any warranty or liability relating to this information or the use thereof. The use of this information is governed by the Terms of Use, available at https://www.wolterskluwer.com/en/know/clinical-effectiveness-terms Copyright Copyright ? 2023 [employer] and its affiliates and/or licensors. All rights reserved.

## 2024-03-01 NOTE — Progress Notes
 SUBJECTIVEJennifer CHRISTELLA Michael is a 53 y.o. G1P1001 woman who presents for annual exam.  Patient's last menstrual period was 02/01/2024 (approximate).  She reports her menses are regular and cyclic qmonth.  She has no complaints, concerns or questions other than hair thinning She denies  hot flashes and night sweatsShe denies spotting She denies vaginal dryness Discussed the concept of HRT and non hormonal treatment of menopause.We discussed the menopausal symptoms to look out for and reason to treat those symptoms including sleep disruptionShe declines HRT for now PGYNHXLast Pap:  September 2022 was normal:SEXUAL Yk:Ejupzwu currently sexually activeMarital Status:  marriedCurrent contraceptive method is noneSOCIAL Yk:Nrrlejupnwjo Hx: RNExercise:  walks daily Diet:  Adequate Tobacco/Alcohol:  non smoker/occasional drinker Are you in a relationship that makes you uncomfortable:  no REVIEW OF SYSTEMS:Weight:  Body mass index is 30.61 kg/m?SABRAEyes:  NormalEars, Nose, Throat:  NormalThyroid:  NormalBreasts:  NormalHeart:  NormalLungs:  NormalBladder, Kidneys:  NormalGI, Stomach:  NormalVagina, Uterus, Ovaries: NormalBlood:  NormalNerves, Neurology:  NormalMuscles, Bones:  NormalPsychiatric:  NormalHEALTH MAINTENANCE:Mammogram:  Ordered test; last was 2/2025Breast Ultrasound:  Not IndicatedColonoscopy:  Up to date 7/2024DEXA Bone Density:  never PROBLEM LIST:Patient Active Problem List  Diagnosis Date Noted  Allergic asthma   Multiple allergies   Hyperlipidemia   Hypertension   Vitamin D deficiency   Elevated alkaline phosphatase level   Tobacco abuse 09/05/2011  Anxiety 09/03/2011 POBHx:OB History Gravida Para Term Preterm AB Living 1 1 1   1  SAB IAB Ectopic Molar Multiple Live Births      1  # Outcome Date GA Lbr Len/2nd Weight Sex Type Anes PTL Lv 1 Term    3.37 kg M CS-LTranv EPI N LIV PMHx:Past Medical History: Diagnosis Date  Allergic asthma   to dog dander  Anxiety   Elevated alkaline phosphatase level   Abd U/S, labs done for eval  Hyperlipidemia   Hypertension   Multiple allergies   Dr Selinda Ruth [facility] Alllergist - allergic to dogs, cats, grass  Vitamin D deficiency  PSHx:Past Surgical History: Procedure Laterality Date  CESAREAN SECTION  2014  x 1  LASIK Bilateral   SEPTOPLASTY  1990s MEDS:Current Medications[1]ALLERGIES:Allergies[2]FAMILY Yk:Qjfpob History Problem Relation Age of Onset  Hypertension Mother   High cholesterol Mother   Barrett's esophagus Mother   Hypertension Sister   Lung cancer Father       died at 78, smoker  Prostate cancer Maternal Grandfather       died in his 67's  Sarcoma Maternal Aunt 52      died at 44 of leiomyosarcoma  Colon cancer Maternal Uncle       died in his 66's  Breast cancer Maternal Aunt       in her 65's  No Known Problems Brother  OBJECTIVEHEIGHT:   5' 1 (1.549 m)     WEIGHT:   73.5 kg	BMI:   Body mass index is 30.61 kg/m?SABRABLOOD PRESSURE:  120/86EXAM:	General:  no acute distress	Skin:  unremarkable, hair thinning on crown 	Neck/Thyroid:  no nodules	Breast:  no LAN, no masses bilaterally	Lungs:  clear to auscultation	Heart:  regular rate and rhythm	Abdomen:  soft, nontender	Vulva:  no lesions	Vagina:  normal	Cervix:  no CMT	Uterus:  mobile, nontender	Adnexae:  no masses or tenderness bilaterally	Rectum:  Rectum and anus are negative	Extremities:  unremarkable, no calf tenderness	CVT: No CVA tendernessPatient exam or treatment required a medical chaperone.Chaperone Flowsheet Documentation:Chaperone Documentation  Chaperone present?: Yes, sensitive parts of the examination/procedure were performed with chaperone presentNon-employee chaperone's name: Madison TateComments: chaperone POC Testing:    Results for  orders placed or performed in visit on 02/09/24 POCT Rapid Strep A [phone])  Collection Time: 02/09/24 12:57 PM Result Value Ref Range  Rapid Strep A Screen Negative Negative  Lot Number Rapid Strep A [id number]   Expiration Date Rapid Strep 08/14/2024   RAPID STREP A CONTROL LINE Present  POCT SARS COV-2 (COVID-19)/Influenza A+B/RSV PCR (Cepheid)  Collection Time: 02/09/24  1:18 PM Result Value Ref Range  POC SARS COV-2 (COVID-19) PCR Negative Negative  POC Influenza A Negative Negative  POC Influenza B Negative Negative  POC RSV Negative Negative  POC Kit Lot Number [id number]   POC Expiration Date 09/04/2024   POC Internal Control Pass  ASSESSMENTAnnual - unremarkable gyn exam	1. Well woman exam with routine gynecological exam  Cytology gyn cases     [facility])  2. Visit for screening mammogram  Mammography Screening Tomo Bilateral  Mammography Screening Tomo Bilateral  PLAN - Pap smear guidelines discussed.  Pap smear was performed.   - Self Breast Exam - Yearly mammography  - Healthy lifestyle - Encouraged regular exercise and adequate calcium / vitamin D intake - RTO in one year and as neededI, Samira Regueiro, MA , personally scribed for Letitia Catena, MD .  Signed: Wenceslao Dewsbury, MA   Date 03/01/2024 Portions of this note were transcribed by a scribe.  I, Doyt Castellana R. Darcus Edds, MD, personally performed the history, physical exam and medical decision making and confirmed the accuracy of the information in the transcribed note.Signed: Letitia Catena, M.D., FACOG10/14/2025 10:55 AM [1] Current Outpatient Medications Medication Sig Dispense Refill  albuterol sulfate 90 mcg/actuation aebs Inhale 180 mcg into the lungs every 4 (four) hours. (Patient taking differently: Inhale 180 mcg into the lungs every 4 (four) hours. prn)    ascorbic acid , vitamin C , (VITAMIN C ) 500 mg tablet Take 1 tablet (500 mg total) by mouth every other day.    cholecalciferol (VITAMIN D-3) 50 mcg (2,000 unit) capsule Take 1 capsule (2,000 Units total) by mouth 2 (two) times daily (0800, 1800).    ferrous sulfate (SLOW RELEASE IRON ) 140 mg (45 mg iron ) TbSR Take 1 tablet (140 mg total) by mouth every other day.    fish oil-omega-3 fatty acids 1,000 mg capsule Take 2 capsules (2 g total) by mouth daily.    fluticasone  propion-salmeteroL (ADVAIR DISKUS) 100-50 mcg/dose blister powder for inhalation Inhale 1 puff into the lungs 2 (two) times daily. (Patient taking differently: Inhale 1 puff into the lungs 2 (two) times daily. prn)    LORazepam  (ATIVAN ) 0.5 mg tablet Take 1 tablet (0.5 mg total) by mouth daily as needed for anxiety. 30 tablet 0  metoprolol  succinate XL (TOPROL -XL) 25 mg 24 hr tablet TAKE 1 TABLET BY MOUTH EVERY DAY WITH OR IMMEDIATELY FOLLOWING A MEAL 90 tablet 1  multivitamin (MULTIVITAMIN) tablet Take 1 tablet by mouth daily.    omeprazole (PRILOSEC OTC) 20 MG tablet Take 1 tablet (20 mg total) by mouth daily.    rosuvastatin  (CRESTOR ) 5 mg tablet TAKE 1 TABLET (5 MG TOTAL) BY MOUTH DAILY. 90 tablet 3  sertraline  (ZOLOFT ) 50 mg tablet TAKE 1 TABLET BY MOUTH EVERY DAY 90 tablet 3 No current facility-administered medications for this visit. [2] No Known Allergies

## 2024-03-10 ENCOUNTER — Other Ambulatory Visit (HOSPITAL_COMMUNITY): Payer: Self-pay

## 2024-03-10 ENCOUNTER — Other Ambulatory Visit: Payer: Self-pay

## 2024-03-10 MED ORDER — LEVOTHYROXINE SODIUM 100 MCG PO TABS
100.0000 ug | ORAL_TABLET | Freq: Every day | ORAL | 0 refills | Status: DC
  Filled 2024-03-10: qty 90, 90d supply, fill #0

## 2024-03-13 ENCOUNTER — Encounter: Admit: 2024-03-13 | Payer: PRIVATE HEALTH INSURANCE | Attending: Internal Medicine | Primary: Internal Medicine

## 2024-03-13 DIAGNOSIS — I10 Essential (primary) hypertension: Principal | ICD-10-CM

## 2024-03-14 ENCOUNTER — Encounter: Admit: 2024-03-14 | Payer: PRIVATE HEALTH INSURANCE | Attending: Obstetrics and Gynecology | Primary: Internal Medicine

## 2024-03-14 MED ORDER — METOPROLOL SUCCINATE ER 25 MG TABLET,EXTENDED RELEASE 24 HR
25 | ORAL_TABLET | Freq: Every day | ORAL | 2 refills | 30.00000 days | Status: AC
Start: 2024-03-14 — End: ?

## 2024-03-14 NOTE — Result Encounter Note [77]
 Findings:Pap Smear Findings: NILM (normal)Follow-up:Colposcopy immediatelyleft message

## 2024-03-22 NOTE — Result Encounter Note [77]
 patient needs colposcopy left 3 messagesplease try to reach

## 2024-03-23 ENCOUNTER — Encounter: Admit: 2024-03-23 | Payer: PRIVATE HEALTH INSURANCE | Primary: Internal Medicine

## 2024-03-23 NOTE — Telephone Encounter [36]
 Appointment Scheduled12-08-2023 [facility]

## 2024-03-23 NOTE — Telephone Encounter [36]
 Call Lysle: PatientRoutineName: Deborah SHARPS SOMUKPhone: [phone] Ext: PT FIRST NAME: PT LAST NAME: Date of Birth: [birth year]Patient's Dr: Dr. CheloucheRegarding: MISSED CALL FROM HER DR, PLEASE CALL AGAIN. PER PATIENT - OK TO WAIT 2024-03-23    8:22 EST

## 2024-03-24 NOTE — Result Encounter Note [77]
 patient has colposcopy scheduled

## 2024-04-01 DIAGNOSIS — R5383 Other fatigue: Secondary | ICD-10-CM | POA: Diagnosis not present

## 2024-04-01 LAB — CBC AND DIFFERENTIAL
HCT: 43 (ref 36–46)
Hemoglobin: 14.2 (ref 12.0–16.0)
Neutrophils Absolute: 2073
Platelets: 286 K/uL (ref 150–400)
WBC: 4.7

## 2024-04-01 LAB — COMPREHENSIVE METABOLIC PANEL WITH GFR
Albumin: 4.4 (ref 3.5–5.0)
Calcium: 9.7 (ref 8.7–10.7)
Globulin: 2.6
eGFR: 69

## 2024-04-01 LAB — LIPID PANEL
Cholesterol: 199 (ref 0–200)
HDL: 78 — AB (ref 35–70)
LDL Cholesterol: 104
LDl/HDL Ratio: 2.6
Triglycerides: 82 (ref 40–160)

## 2024-04-01 LAB — HEPATIC FUNCTION PANEL
ALT: 17 U/L (ref 7–35)
AST: 19 (ref 13–35)
Alkaline Phosphatase: 45 (ref 25–125)
Bilirubin, Total: 0.3

## 2024-04-01 LAB — BASIC METABOLIC PANEL WITH GFR
BUN: 12 (ref 4–21)
CO2: 29 — AB (ref 13–22)
Chloride: 104 (ref 99–108)
Creatinine: 1 (ref 0.5–1.1)
Glucose: 85
Potassium: 5.1 meq/L (ref 3.5–5.1)
Sodium: 142 (ref 137–147)

## 2024-04-01 LAB — TSH: TSH: 1.01 (ref 0.41–5.90)

## 2024-04-01 LAB — CBC: RBC: 4.69 (ref 3.87–5.11)

## 2024-04-04 ENCOUNTER — Encounter: Payer: Federal, State, Local not specified - PPO | Admitting: Nurse Practitioner

## 2024-04-09 ENCOUNTER — Other Ambulatory Visit: Payer: Self-pay | Admitting: Nurse Practitioner

## 2024-04-11 ENCOUNTER — Other Ambulatory Visit: Payer: Self-pay

## 2024-04-11 ENCOUNTER — Encounter: Admit: 2024-04-11 | Payer: PRIVATE HEALTH INSURANCE | Attending: Internal Medicine | Primary: Internal Medicine

## 2024-04-11 DIAGNOSIS — E7849 Other hyperlipidemia: Principal | ICD-10-CM

## 2024-04-11 MED ORDER — ROSUVASTATIN 5 MG TABLET
5 | ORAL_TABLET | Freq: Every day | ORAL | 4 refills | 90.00000 days | Status: AC
Start: 2024-04-11 — End: ?

## 2024-04-12 ENCOUNTER — Other Ambulatory Visit (HOSPITAL_COMMUNITY): Payer: Self-pay

## 2024-04-12 DIAGNOSIS — E039 Hypothyroidism, unspecified: Secondary | ICD-10-CM | POA: Diagnosis not present

## 2024-04-12 DIAGNOSIS — Z79899 Other long term (current) drug therapy: Secondary | ICD-10-CM | POA: Diagnosis not present

## 2024-04-12 DIAGNOSIS — F411 Generalized anxiety disorder: Secondary | ICD-10-CM | POA: Diagnosis not present

## 2024-04-12 DIAGNOSIS — G4700 Insomnia, unspecified: Secondary | ICD-10-CM | POA: Diagnosis not present

## 2024-04-12 MED ORDER — LEVOTHYROXINE SODIUM 88 MCG PO TABS
88.0000 ug | ORAL_TABLET | ORAL | 3 refills | Status: AC
  Filled 2024-04-12: qty 36, 84d supply, fill #0

## 2024-04-12 MED ORDER — LEVOTHYROXINE SODIUM 100 MCG PO TABS
100.0000 ug | ORAL_TABLET | ORAL | 2 refills | Status: AC
  Filled 2024-04-12: qty 52, 90d supply, fill #0

## 2024-04-12 MED ORDER — ARIPIPRAZOLE 2 MG PO TABS
2.0000 mg | ORAL_TABLET | Freq: Every day | ORAL | 1 refills | Status: DC
  Filled 2024-04-12: qty 90, 90d supply, fill #0

## 2024-04-21 ENCOUNTER — Encounter: Admit: 2024-04-21 | Payer: PRIVATE HEALTH INSURANCE | Attending: Obstetrics and Gynecology | Primary: Internal Medicine

## 2024-04-21 VITALS — BP 126/84

## 2024-04-21 DIAGNOSIS — R87619 Unspecified abnormal cytological findings in specimens from cervix uteri: Principal | ICD-10-CM

## 2024-04-25 ENCOUNTER — Encounter: Admit: 2024-04-25 | Payer: PRIVATE HEALTH INSURANCE | Attending: Obstetrics and Gynecology | Primary: Internal Medicine

## 2024-04-25 NOTE — Telephone Encounter [36]
 LVM for patient to return callNegative pathology, repeat pap at annual exam

## 2024-04-25 NOTE — Telephone Encounter [36]
-----   Message from Letitia Catena, MD sent at 04/25/2024 10:41 AM EST -----Please advise biopsy results are normal. Pt needs repeat pap in 12 months or at annual. Please schedule.----- Message -----From: Edi, Path Results InSent: 04/22/2024   2:06 PM ESTTo: Adina R. Chelouche, MD

## 2024-04-25 NOTE — Progress Notes [1]
 Colposcopy Procedure NoteThere were no vitals taken for this visit.Pregnancy Test Indicated: Not applicableLMP: No LMP recorded.Indications: benign cellular changes positive HPV 16 Last Pap: benign cellular changes positive HPV 16 Last Colposcopy Result: unknownLast LEEP/Cone Result: neverTime Out PerformedYesPhysical ExamGenitourinary: Procedure Details The risks and benefits of the procedure were reviewed and informed consent was obtained.Speculum placed in vagina and excellent visualization of Cervix and Vagina was achieved.  The Cervix and Vagina were swabbed  with Acetic AcidFindings:Cervix: acetowhite lesion(s) noted at 2 o'clock; endocervical curettage performed with brush & curette and cervical biopsies taken at 2 o'clock.Chaperone Documentation  Chaperone present?: Yes, sensitive parts of the examination/procedure were performed with chaperone presentEmployee chaperone's name: Regueiro, SamiraComments: MA        Additional Findings:Specimens: Biopsy and ECCComplications: none.Plan:Specimens labelled and sent to Pathology.Will base further treatment on Pathology findings.Followup:I will call in 2  weeks to discuss results. Nashiya Disbrow R. Zyden Suman, MD

## 2024-04-25 NOTE — Result Encounter Note [77]
Please advise biopsy results  are normal . Pt needs repeat pap in 12 months or at annual. Please schedule.

## 2024-04-30 ENCOUNTER — Other Ambulatory Visit: Payer: Self-pay | Admitting: Nurse Practitioner

## 2024-04-30 ENCOUNTER — Other Ambulatory Visit (HOSPITAL_COMMUNITY): Payer: Self-pay

## 2024-05-02 ENCOUNTER — Other Ambulatory Visit (HOSPITAL_COMMUNITY): Payer: Self-pay

## 2024-05-03 ENCOUNTER — Other Ambulatory Visit (HOSPITAL_COMMUNITY): Payer: Self-pay

## 2024-05-03 ENCOUNTER — Encounter (HOSPITAL_COMMUNITY): Payer: Self-pay

## 2024-05-04 ENCOUNTER — Other Ambulatory Visit: Payer: Self-pay | Admitting: Nurse Practitioner

## 2024-05-06 ENCOUNTER — Other Ambulatory Visit (HOSPITAL_COMMUNITY): Payer: Self-pay

## 2024-05-06 ENCOUNTER — Other Ambulatory Visit: Payer: Self-pay

## 2024-05-09 ENCOUNTER — Other Ambulatory Visit (HOSPITAL_COMMUNITY): Payer: Self-pay

## 2024-05-10 ENCOUNTER — Other Ambulatory Visit (HOSPITAL_COMMUNITY): Payer: Self-pay

## 2024-05-10 ENCOUNTER — Other Ambulatory Visit: Payer: Self-pay

## 2024-05-11 ENCOUNTER — Encounter: Payer: Federal, State, Local not specified - PPO | Admitting: Nurse Practitioner

## 2024-05-13 ENCOUNTER — Other Ambulatory Visit (HOSPITAL_COMMUNITY): Payer: Self-pay

## 2024-05-16 ENCOUNTER — Encounter (HOSPITAL_COMMUNITY): Payer: Self-pay

## 2024-05-16 ENCOUNTER — Other Ambulatory Visit (HOSPITAL_COMMUNITY): Payer: Self-pay

## 2024-05-17 ENCOUNTER — Ambulatory Visit: Admitting: Family Medicine

## 2024-05-18 ENCOUNTER — Encounter: Admit: 2024-05-18 | Payer: PRIVATE HEALTH INSURANCE | Primary: Internal Medicine

## 2024-05-18 ENCOUNTER — Encounter: Admit: 2024-05-18 | Payer: PRIVATE HEALTH INSURANCE | Attending: Internal Medicine | Primary: Internal Medicine

## 2024-05-18 DIAGNOSIS — F419 Anxiety disorder, unspecified: Secondary | ICD-10-CM | POA: Insufficient documentation

## 2024-05-18 DIAGNOSIS — N3010 Interstitial cystitis (chronic) without hematuria: Secondary | ICD-10-CM | POA: Insufficient documentation

## 2024-05-18 LAB — CBC AND DIFFERENTIAL
BASOPHILS ABSOLUTE COUNT: 53 {cells}/uL (ref 0–200)
BASOPHILS: 0.5 %
EOSINOPHILS ABSOLUTE COUNT: 276 {cells}/uL (ref 15–500)
EOSINOPHILS: 2.6 %
HEMATOCRIT BLOOD: 37.4 % (ref 35.9–46.0)
HEMOGLOBIN: 12.5 g/dL (ref 11.7–15.5)
LYMPHOCYTES ABSOLUTE COUNT: 1601 {cells}/uL (ref 850–3900)
LYMPHOCYTES: 15.1 %
MCH: 31.4 pg (ref 27.0–33.0)
MCHC-HEMOGLOBINOPATHY: 33.4 g/dL (ref 31.6–35.4)
MCV: 94 fL (ref 81.4–101.7)
MONOCYTES ABSOLUTE COUNT: 975 {cells}/uL — ABNORMAL HIGH (ref 200–950)
MONOCYTES: 9.2 %
MPV: 9.7 fL (ref 7.5–12.5)
NEUTROPHILS ABSOLUTE COUNT: 7696 {cells}/uL (ref 1500–7800)
NEUTROPHILS: 72.6 %
PLATELET COUNT: 349 Thousand/uL (ref 140–400)
RDW: 12 % (ref 11.0–15.0)
RED BLOOD CELL COUNT: 3.98 Million/uL (ref 3.80–5.10)
WHITE BLOOD CELL COUNT: 10.6 Thousand/uL (ref 3.8–10.8)

## 2024-05-18 LAB — COMPREHENSIVE METABOLIC PANEL
ALBUMIN/GLOBULIN RATIO: 1.6 (calc) (ref 1.0–2.5)
ALBUMIN: 4.5 g/dL (ref 3.6–5.1)
ALKALINE PHOSPHATASE: 110 U/L (ref 37–153)
ALT (SGPT): 28 U/L (ref 6–29)
AST (SGOT): 23 U/L (ref 10–35)
BILIRUBIN, TOTAL: 0.6 mg/dL (ref 0.2–1.2)
BLOOD UREA NITROGEN: 8 mg/dL (ref 7–25)
CALCIUM: 9.8 mg/dL (ref 8.6–10.4)
CHLORIDE: 100 mmol/L (ref 98–110)
CO2: 26 mmol/L (ref 20–32)
CREATININE: 0.59 mg/dL (ref 0.50–1.03)
EGFR, CREATININE (CKD-EPI 2021) QUEST: 109 mL/min/1.73m2 (ref >=60)
GLOBULIN: 2.9 g/dL (ref 1.9–3.7)
GLUCOSE: 84 mg/dL (ref 65–99)
POTASSIUM: 4.3 mmol/L (ref 3.5–5.3)
PROTEIN, TOTAL, SPEP: 7.4 g/dL (ref 6.1–8.1)
SODIUM: 136 mmol/L (ref 135–146)

## 2024-05-18 LAB — LIPID PANEL
CHOLESTEROL, TOTAL: 126 mg/dL (ref <200)
CHOLESTEROL/HDL RATIO: 2.9 (calc) (ref <5.0)
HDL CHOLESTEROL: 43 mg/dL — ABNORMAL LOW (ref >=50)
LDL CHOLESTEROL: 65 mg/dL
NON-HDL CHOLESTEROL: 83 mg/dL (ref <130)
TRIGLYCERIDES: 99 mg/dL (ref <150)

## 2024-05-18 LAB — HEMOGLOBIN A1C: HEMOGLOBIN A1C: 5.2 % (ref <5.7)

## 2024-05-18 LAB — VITAMIN D, 25-HYDROXY: VITAMIN D, 25 OH, TOTAL: 48 ng/mL (ref 30–100)

## 2024-05-18 LAB — VITAMIN B12: VITAMIN B-12: 936 pg/mL (ref 200–1100)

## 2024-05-18 LAB — TSH W/REFLEX TO FT4     (BH GH LMW Q YH): TSH, 3RD GENERATION W/ REFLEX TO FT4: 1.69 m[IU]/L

## 2024-05-18 LAB — FERRITIN: FERRITIN: 83 ng/mL (ref 16–232)

## 2024-05-18 NOTE — Result Encounter Note [77]
 Note sent through MyChart.

## 2024-05-20 ENCOUNTER — Other Ambulatory Visit (HOSPITAL_COMMUNITY): Payer: Self-pay

## 2024-05-20 ENCOUNTER — Encounter: Payer: Self-pay | Admitting: Sports Medicine

## 2024-05-20 ENCOUNTER — Ambulatory Visit: Admitting: Sports Medicine

## 2024-05-20 VITALS — BP 127/89 | HR 70 | Temp 98.6°F | Ht 68.0 in | Wt 168.8 lb

## 2024-05-20 DIAGNOSIS — I10 Essential (primary) hypertension: Secondary | ICD-10-CM

## 2024-05-20 DIAGNOSIS — K219 Gastro-esophageal reflux disease without esophagitis: Secondary | ICD-10-CM

## 2024-05-20 DIAGNOSIS — E669 Obesity, unspecified: Secondary | ICD-10-CM | POA: Diagnosis not present

## 2024-05-20 DIAGNOSIS — R32 Unspecified urinary incontinence: Secondary | ICD-10-CM

## 2024-05-20 DIAGNOSIS — F411 Generalized anxiety disorder: Secondary | ICD-10-CM | POA: Diagnosis not present

## 2024-05-20 DIAGNOSIS — Z1231 Encounter for screening mammogram for malignant neoplasm of breast: Secondary | ICD-10-CM | POA: Diagnosis not present

## 2024-05-20 DIAGNOSIS — Z124 Encounter for screening for malignant neoplasm of cervix: Secondary | ICD-10-CM

## 2024-05-20 MED ORDER — ALPRAZOLAM 0.25 MG PO TABS
0.2500 mg | ORAL_TABLET | Freq: Every day | ORAL | 0 refills | Status: AC
  Filled 2024-05-20: qty 30, 30d supply, fill #0

## 2024-05-20 MED ORDER — OXYBUTYNIN CHLORIDE ER 10 MG PO TB24
10.0000 mg | ORAL_TABLET | Freq: Every day | ORAL | 0 refills | Status: AC
  Filled 2024-05-20: qty 90, 90d supply, fill #0

## 2024-05-20 NOTE — Patient Instructions (Signed)

## 2024-05-20 NOTE — Progress Notes (Signed)
 "  New Patient Office Visit  Patient ID: Shari Payne, Female   DOB: 1970-10-17 54 y.o. MRN: [id number] Subjective:     Discussed the use of AI scribe software for clinical note transcription with the patient, who gave verbal consent to proceed.  History of Present Illness  Shari Payne is a 54 year old female who presents to establish care for medication refills.  She is seeking refills for Ditropan  and Xanax . She takes Ditropan  10 mg for bladder spasms, described as 'burning spasming', which resolves with the medication. Without Ditropan , she experiences significant discomfort. Xanax  0.25 mg, half a tablet at bedtime, is taken approximately four days a week to manage anxiety-related skin picking, preventing her from digging into her skin when not taken regularly. She last refilled Xanax  on February 10, 2024.  She is on Lexapro  20 mg daily for anxiety and depression, which are stable with this medication. She experienced increased anxiety with Effexor , Wellbutrin , and Abilify , tried during menopause-related mood changes. DHEA supplements have been helpful for her mood.  She takes Wegovy  0.5 mg for weight loss, having reduced her weight from 215 lbs to 165 lbs. She pays out of pocket for this medication as her insurance no longer covers it. Occasional nausea is reported with Wegovy .  Her other medications include Prilosec and Pepcid  for gastrointestinal symptoms, Zetia  for cholesterol, and levothyroxine  for thyroid  management. 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 She has a history of low vitamin D  levels, managed with 5000 units daily, though levels remain low normal despite supplementation. Recent vitamin D  levels have not been checked.  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 She has not been exercising regularly due to her work schedule.  She has a history of moderate spinal stenosis, improved with weight loss, though she  occasionally experiences manageable lower back pain. Flexeril  is used as needed for muscle relaxation during back pain flares. A normal colonoscopy in 2024 showed no polyps. No gastrointestinal bleeding or significant changes in bowel habits. No shortness of breath, cough, congestion, dizziness, lightheadedness, tingling, numbness, or significant gastrointestinal symptoms.   Outpatient Encounter Medications as of 05/20/2024  Medication Sig   ALPRAZolam  (XANAX ) 0.25 MG tablet Take 1 tablet (0.25 mg total) by mouth at bedtime.   ALPRAZolam  (XANAX ) 0.5 MG tablet Take 0.5 tablets (0.25 mg total) by mouth as needed for anxiety as directed.   Cholecalciferol (VITAMIN D  PO) Take 5,000 Units by mouth daily.   escitalopram  (LEXAPRO ) 20 MG tablet Take 1 tablet (20 mg total) by mouth daily at night time.   ezetimibe  (ZETIA ) 10 MG tablet Take 1 tablet (10 mg total) by mouth daily.   famotidine  (PEPCID ) 40 MG tablet Take 1 tablet (40 mg total) by mouth daily for heartburn.   levothyroxine  (SYNTHROID ) 100 MCG tablet Take 1 tablet (100 mcg total) by mouth 4 (four) times a week. (Tuesday, Thursday, Saturday and Sunday)   levothyroxine  (SYNTHROID ) 88 MCG tablet Take 1 tablet (88 mcg total) by mouth 3 (three) times a week (Monday, Wednesday, Friday) in the morning on an empty stomach.   omeprazole  (PRILOSEC) 20 MG capsule Take 1 capsule (20 mg total) by mouth daily as needed.   Semaglutide -Weight Management (WEGOVY ) 0.5 MG/0.5ML SOAJ Inject 0.5 mg into the skin once a week.   [DISCONTINUED] ezetimibe  (ZETIA ) 10 MG tablet Take 1 tablet (10 mg total) by mouth daily.   [DISCONTINUED] metoprolol  succinate (TOPROL -XL) 50 MG 24 hr  tablet Take 1 tablet (50 mg total) by mouth at bedtime.   [DISCONTINUED] oxybutynin  (DITROPAN -XL) 10 MG 24 hr tablet Take 1 tablet (10 mg total) by mouth daily for bladder control   cyclobenzaprine  (FLEXERIL ) 10 MG tablet Take 1 tablet (10 mg total) by mouth every 8 (eight) hours as needed for  muscle spasms.   fluticasone  (FLONASE ) 50 MCG/ACT nasal spray Place 2 sprays into both nostrils daily.   levothyroxine  (SYNTHROID ) 100 MCG tablet Take 1 tablet (100 mcg total) by mouth daily before breakfast.   metoprolol  succinate (TOPROL -XL) 50 MG 24 hr tablet Take 1 tablet (50 mg total) by mouth daily.   oxybutynin  (DITROPAN -XL) 10 MG 24 hr tablet Take 1 tablet (10 mg total) by mouth daily for bladder control   [DISCONTINUED] ALPRAZolam  (XANAX ) 0.5 MG tablet Take 1/2-1 tablet (0.25-0.5mg ) by mouth 3 (three) times daily as needed for severe anxiety or panic. AVOID TAKING DAILY DUE TO RISK OF TOLERANCE AND ADDICTION . Must last 30 days   [DISCONTINUED] ALPRAZolam  (XANAX ) 0.5 MG tablet Take 1/2-1 tablet ( 0.25-0.5 mg total) by mouth 2 (two) times daily, as needed for panic attacks.   [DISCONTINUED] ARIPiprazole  (ABILIFY ) 2 MG tablet Take 1 tablet (2 mg total) by mouth daily.   [DISCONTINUED] budesonide -formoterol  (SYMBICORT ) 80-4.5 MCG/ACT inhaler Inhale 2 puffs daily twice a day, wash your mouth afterwards   [DISCONTINUED] buPROPion  (WELLBUTRIN  XL) 150 MG 24 hr tablet Take 1 tablet (150 mg total) by mouth every morning.   [DISCONTINUED] escitalopram  (LEXAPRO ) 20 MG tablet Take 1 tablet (20 mg total) by mouth daily.   [DISCONTINUED] famotidine  (PEPCID ) 40 MG tablet Take 1 tablet (40 mg total) by mouth daily for heartbrurn.   [DISCONTINUED] phentermine  (ADIPEX-P ) 37.5 MG tablet Take 1/2-1 tablet (18.75-37.5 mg total) by mouth every morning for dieting and weight loss   [DISCONTINUED] Semaglutide -Weight Management 0.25 MG/0.5ML SOAJ Inject 0.25 mg into the skin once a week.   [DISCONTINUED] topiramate  (TOPAMAX ) 50 MG tablet Take 1 tablet (50 mg total) by mouth 2 (two) times daily.   [DISCONTINUED] venlafaxine  XR (EFFEXOR -XR) 37.5 MG 24 hr capsule Take 1 capsule (37.5 mg total) by mouth every morning with food.   No facility-administered encounter medications on file as of 05/20/2024.    Past Medical  History:  Diagnosis Date   Anxiety    Breast hypertrophy 03/2014   GERD (gastroesophageal reflux disease)    Hyperlipidemia    Hypothyroidism    Interstitial cystitis     Past Surgical History:  Procedure Laterality Date   BREAST BIOPSY Left    BREAST REDUCTION SURGERY Bilateral 04/18/2014   Procedure: MAMMARY REDUCTION  (BREAST) BILATERAL;  Surgeon: Ronal DELENA Mage, MD;  Location: [facility];  Service: Plastics;  Laterality: Bilateral;    Family History  Problem Relation Age of Onset   Diabetes Father    Osteosarcoma Sister 42   CVA Maternal Grandfather 2   Heart failure Paternal Grandfather        90s   Breast cancer Neg Hx    Colon cancer Neg Hx    Esophageal cancer Neg Hx    Pancreatic cancer Neg Hx    Stomach cancer Neg Hx     Social History   Socioeconomic History   Marital status: Married    Spouse name: Not on file   Number of children: 1   Years of education: Not on file   Highest education level: Not on file  Occupational History   Occupation: [employer]  Tobacco Use   Smoking status: Every Day    Current packs/day: 0.50    Average packs/day: 0.5 packs/day for 30.0 years (15.0 ttl pk-yrs)    Types: Cigarettes   Smokeless tobacco: Never   Tobacco comments:    currently down to 0.25 pack/day or so  Vaping Use   Vaping status: Never Used  Substance and Sexual Activity   Alcohol use: Yes    Alcohol/week: 10.0 standard drinks of alcohol    Types: 10 Standard drinks or equivalent per week    Comment: weekends   Drug use: No   Sexual activity: Yes    Partners: Male    Birth control/protection: Surgical    Comment: partner had vasectomy  Other Topics Concern   Not on file  Social History Narrative   Not on file   Social Drivers of Health   Tobacco Use: High Risk (05/20/2024)   Patient History    Smoking Tobacco Use: Every Day    Smokeless Tobacco Use: Never    Passive Exposure: Not on file  Financial Resource Strain: Not on file   Food Insecurity: Not on file  Transportation Needs: Not on file  Physical Activity: Not on file  Stress: Not on file  Social Connections: Not on file  Intimate Partner Violence: Not on file  Depression (PHQ2-9): Low Risk (05/20/2024)   Depression (PHQ2-9)    PHQ-2 Score: 1  Alcohol Screen: Not on file  Housing: Not on file  Utilities: Not on file  Health Literacy: Not on file    Review of Systems  Constitutional:  Negative for chills and fever.  HENT:  Negative for congestion and hearing loss.   Respiratory:  Negative for cough and shortness of breath.   Cardiovascular:  Negative for chest pain, palpitations and leg swelling.  Gastrointestinal:  Negative for abdominal pain, blood in stool, constipation, heartburn, nausea and vomiting.  Genitourinary:  Negative for dysuria and hematuria.  Musculoskeletal:  Positive for back pain.  Neurological:  Negative for dizziness, sensory change and focal weakness.  Psychiatric/Behavioral:  Negative for depression and suicidal ideas.      Objective:    BP 127/89   Pulse 70   Temp 98.6 F (37 C) (Oral)   Ht 5' 8 (1.727 m)   Wt 168 lb 12.8 oz (76.6 kg)   SpO2 99%   BMI 25.67 kg/m   Physical Exam Constitutional:      Appearance: Normal appearance.  HENT:     Head: Normocephalic and atraumatic.  Cardiovascular:     Rate and Rhythm: Normal rate and regular rhythm.  Pulmonary:     Effort: Pulmonary effort is normal. No respiratory distress.     Breath sounds: Normal breath sounds. No wheezing.  Abdominal:     General: Bowel sounds are normal. There is no distension.     Tenderness: There is no abdominal tenderness. There is no guarding or rebound.     Comments:    Musculoskeletal:        General: No swelling or tenderness.  Neurological:     Mental Status: She is alert. Mental status is at baseline.     Sensory: No sensory deficit.     Motor: No weakness.     Last CBC Lab Results  Component Value Date   WBC 8.2  05/08/2023   HGB 15.0 05/08/2023   HCT 44.9 05/08/2023   MCV 92.0 05/08/2023   MCH 30.7 05/08/2023   RDW 11.8 05/08/2023   PLT 311 05/08/2023  Last metabolic panel Lab Results  Component Value Date   GLUCOSE 89 05/08/2023   NA 143 05/08/2023   K 4.3 05/08/2023   CL 104 05/08/2023   CO2 29 05/08/2023   BUN 12 05/08/2023   CREATININE 0.96 05/08/2023   EGFR 72 05/08/2023   CALCIUM  9.8 05/08/2023   PROT 8.1 05/08/2023   ALBUMIN 4.4 10/03/2016   BILITOT 0.5 05/08/2023   ALKPHOS 77 10/03/2016   AST 22 05/08/2023   ALT 17 05/08/2023   Last lipids Lab Results  Component Value Date   CHOL 192 05/08/2023   HDL 99 05/08/2023   LDLCALC 74 05/08/2023   TRIG 102 05/08/2023   CHOLHDL 1.9 05/08/2023   Last hemoglobin A1c Lab Results  Component Value Date   HGBA1C 5.3 05/08/2023   Last thyroid  functions Lab Results  Component Value Date   TSH 1.33 05/08/2023   Last vitamin D  Lab Results  Component Value Date   VD25OH 71 05/08/2023   Last vitamin B12 and Folate Lab Results  Component Value Date   VITAMINB12 528 12/16/2018       Assessment & Plan:   Assessment & Plan Primary hypertension At goal  H/o SVC  Cont with metoprolol     GAD (generalized anxiety disorder) Stable Cont with lexapro  Refilled xanax     Gastroesophageal reflux disease without esophagitis Denies acid reflux or dark stools Cont with pepcid     Obesity without serious comorbidity, unspecified class, unspecified obesity type On wegovy   Pays out of pocket    Cervical cancer screening  Orders:   Ambulatory referral to Gynecology  Breast cancer screening by mammogram  Orders:   MM 3D SCREENING MAMMOGRAM BILATERAL BREAST; Future  Urinary incontinence, unspecified type Refilled ditropan      Return in about 3 months (around 08/18/2024).   Jackalyn Blazing, MD [facility]    "

## 2024-05-20 NOTE — Assessment & Plan Note (Addendum)
 Denies acid reflux or dark stools Cont with pepcid 

## 2024-05-25 ENCOUNTER — Other Ambulatory Visit (HOSPITAL_COMMUNITY): Payer: Self-pay

## 2024-06-07 ENCOUNTER — Other Ambulatory Visit: Payer: Self-pay | Admitting: Cardiology

## 2024-06-07 ENCOUNTER — Other Ambulatory Visit: Payer: Self-pay

## 2024-06-07 DIAGNOSIS — E7800 Pure hypercholesterolemia, unspecified: Secondary | ICD-10-CM

## 2024-06-09 ENCOUNTER — Other Ambulatory Visit (HOSPITAL_COMMUNITY): Payer: Self-pay

## 2024-06-10 ENCOUNTER — Other Ambulatory Visit: Payer: Self-pay

## 2024-06-10 ENCOUNTER — Other Ambulatory Visit: Payer: Self-pay | Admitting: Cardiology

## 2024-06-10 ENCOUNTER — Other Ambulatory Visit (HOSPITAL_COMMUNITY): Payer: Self-pay

## 2024-06-10 DIAGNOSIS — E7800 Pure hypercholesterolemia, unspecified: Secondary | ICD-10-CM

## 2024-06-10 MED ORDER — EZETIMIBE 10 MG PO TABS
10.0000 mg | ORAL_TABLET | Freq: Every day | ORAL | 0 refills | Status: AC
  Filled 2024-06-10: qty 90, 90d supply, fill #0

## 2024-06-28 ENCOUNTER — Encounter: Admit: 2024-06-28 | Payer: PRIVATE HEALTH INSURANCE | Attending: Internal Medicine | Primary: Internal Medicine

## 2024-08-23 ENCOUNTER — Ambulatory Visit: Admitting: Sports Medicine
